# Patient Record
Sex: Male | Born: 1945
Health system: Southern US, Community
[De-identification: ages and names within clinical notes are randomized; demographics above are authoritative.]

## PROBLEM LIST (undated history)

## (undated) DIAGNOSIS — R001 Bradycardia, unspecified: Secondary | ICD-10-CM

## (undated) DIAGNOSIS — Z9889 Other specified postprocedural states: Secondary | ICD-10-CM

## (undated) DIAGNOSIS — M1990 Unspecified osteoarthritis, unspecified site: Secondary | ICD-10-CM

## (undated) DIAGNOSIS — H409 Unspecified glaucoma: Secondary | ICD-10-CM

## (undated) DIAGNOSIS — G5762 Lesion of plantar nerve, left lower limb: Secondary | ICD-10-CM

## (undated) DIAGNOSIS — I6529 Occlusion and stenosis of unspecified carotid artery: Secondary | ICD-10-CM

## (undated) DIAGNOSIS — I4510 Unspecified right bundle-branch block: Secondary | ICD-10-CM

## (undated) DIAGNOSIS — I493 Ventricular premature depolarization: Secondary | ICD-10-CM

## (undated) DIAGNOSIS — Z86010 Personal history of colonic polyps: Principal | ICD-10-CM

## (undated) DIAGNOSIS — K648 Other hemorrhoids: Secondary | ICD-10-CM

## (undated) DIAGNOSIS — I2510 Atherosclerotic heart disease of native coronary artery without angina pectoris: Secondary | ICD-10-CM

## (undated) DIAGNOSIS — K4090 Unilateral inguinal hernia, without obstruction or gangrene, not specified as recurrent: Secondary | ICD-10-CM

## (undated) HISTORY — PX: POLYPECTOMY: SHX149

## (undated) HISTORY — PX: TONSILLECTOMY AND ADENOIDECTOMY: SUR1326

## (undated) HISTORY — PX: TONSILLECTOMY: SUR1361

## (undated) HISTORY — DX: Bradycardia, unspecified: R00.1

## (undated) HISTORY — DX: Other hemorrhoids: K64.8

## (undated) HISTORY — PX: COLONOSCOPY: SHX174

## (undated) HISTORY — PX: ELBOW SURGERY: SHX618

## (undated) HISTORY — PX: INGUINAL HERNIA REPAIR: SUR1180

## (undated) HISTORY — DX: Unspecified glaucoma: H40.9

## (undated) HISTORY — DX: Personal history of colonic polyps: Z86.010

## (undated) HISTORY — PX: HERNIA REPAIR: SHX51

## (undated) HISTORY — DX: Ventricular premature depolarization: I49.3

---

## 1999-12-26 ENCOUNTER — Encounter: Payer: Self-pay | Admitting: Emergency Medicine

## 1999-12-26 ENCOUNTER — Emergency Department (HOSPITAL_COMMUNITY): Admission: EM | Admit: 1999-12-26 | Discharge: 1999-12-26 | Payer: Self-pay | Admitting: Emergency Medicine

## 1999-12-28 ENCOUNTER — Emergency Department (HOSPITAL_COMMUNITY): Admission: EM | Admit: 1999-12-28 | Discharge: 1999-12-28 | Payer: Self-pay | Admitting: Emergency Medicine

## 2000-01-04 ENCOUNTER — Emergency Department (HOSPITAL_COMMUNITY): Admission: EM | Admit: 2000-01-04 | Discharge: 2000-01-04 | Payer: Self-pay | Admitting: Emergency Medicine

## 2005-04-12 ENCOUNTER — Ambulatory Visit: Payer: Self-pay

## 2005-04-12 ENCOUNTER — Encounter: Payer: Self-pay | Admitting: Internal Medicine

## 2005-04-12 ENCOUNTER — Ambulatory Visit: Payer: Self-pay | Admitting: Internal Medicine

## 2005-04-15 ENCOUNTER — Ambulatory Visit: Payer: Self-pay

## 2005-04-15 ENCOUNTER — Ambulatory Visit: Payer: Self-pay | Admitting: Internal Medicine

## 2005-04-15 DIAGNOSIS — Z9289 Personal history of other medical treatment: Secondary | ICD-10-CM

## 2005-04-15 HISTORY — DX: Personal history of other medical treatment: Z92.89

## 2005-04-20 ENCOUNTER — Encounter: Admission: RE | Admit: 2005-04-20 | Discharge: 2005-04-20 | Payer: Self-pay | Admitting: Internal Medicine

## 2006-02-10 ENCOUNTER — Ambulatory Visit: Payer: Self-pay | Admitting: Internal Medicine

## 2006-02-21 ENCOUNTER — Ambulatory Visit: Payer: Self-pay | Admitting: Internal Medicine

## 2008-07-08 ENCOUNTER — Ambulatory Visit: Payer: Self-pay | Admitting: Surgery

## 2009-07-15 ENCOUNTER — Ambulatory Visit: Payer: Self-pay | Admitting: Vascular Surgery

## 2009-11-18 ENCOUNTER — Ambulatory Visit (HOSPITAL_COMMUNITY): Admission: RE | Admit: 2009-11-18 | Discharge: 2009-11-18 | Payer: Self-pay | Admitting: General Surgery

## 2010-07-11 LAB — SURGICAL PCR SCREEN
MRSA, PCR: NEGATIVE
Staphylococcus aureus: NEGATIVE

## 2010-09-08 NOTE — Procedures (Signed)
CAROTID DUPLEX EXAM   INDICATION:  Left retinal artery plaque.   HISTORY:  Diabetes:  No.  Cardiac:  No.  Hypertension:  No.  Smoking:  No.  Previous Surgery:  No.  CV History:  No.  Amaurosis Fugax No, Paresthesias No, Hemiparesis No.                                       RIGHT             LEFT  Brachial systolic pressure:         100               96  Brachial Doppler waveforms:         Within normal limits                Within normal limits  Vertebral direction of flow:        Antegrade         Antegrade  DUPLEX VELOCITIES (cm/sec)  CCA peak systolic                   184               118  ECA peak systolic                   87                75  ICA peak systolic                   109               90  ICA end diastolic                   30                28  PLAQUE MORPHOLOGY:                  Homogeneous       Homogeneous  PLAQUE AMOUNT:                      Mild              Mild  PLAQUE LOCATION:                    BIF/ICA           BIF/ICA   IMPRESSION:  20-39% bilateral internal carotid artery stenosis.   ___________________________________________  V. Charlena Cross, MD   AC/MEDQ  D:  07/08/2008  T:  07/08/2008  Job:  [id number]

## 2010-09-08 NOTE — Procedures (Signed)
CAROTID DUPLEX EXAM   INDICATION:  Follow up retinal artery plaque.   HISTORY:  Diabetes:  No.  Cardiac:  No.  Hypertension:  No.  Smoking:  No.  Previous Surgery:  No.  CV History:  No.  Amaurosis Fugax No, Paresthesias No, Hemiparesis No.                                       RIGHT             LEFT  Brachial systolic pressure:         104               112  Brachial Doppler waveforms:         WNL               WNL  Vertebral direction of flow:        Antegrade         Antegrade  DUPLEX VELOCITIES (cm/sec)  CCA peak systolic                   159               126  ECA peak systolic                   89                105  ICA peak systolic                   99                101  ICA end diastolic                   47                32  PLAQUE MORPHOLOGY:                                    Homogenous  PLAQUE AMOUNT:                                        Mild  PLAQUE LOCATION:                                      ECA   IMPRESSION:  1. Bilateral internal carotid arteries suggest 20-39% stenosis.  2. Antegrade flow in bilateral vertebrals.          ___________________________________________  Quita Skye Hart [place], M.D.   CB/MEDQ  D:  07/15/2009  T:  07/16/2009  Job:  [id number]

## 2015-04-07 ENCOUNTER — Encounter: Payer: Self-pay | Admitting: Internal Medicine

## 2015-06-30 DIAGNOSIS — M1711 Unilateral primary osteoarthritis, right knee: Secondary | ICD-10-CM | POA: Diagnosis not present

## 2015-07-14 ENCOUNTER — Other Ambulatory Visit: Payer: Self-pay | Admitting: Orthopaedic Surgery

## 2015-07-14 DIAGNOSIS — M25561 Pain in right knee: Secondary | ICD-10-CM

## 2015-07-16 DIAGNOSIS — H5201 Hypermetropia, right eye: Secondary | ICD-10-CM | POA: Diagnosis not present

## 2015-07-16 DIAGNOSIS — H52223 Regular astigmatism, bilateral: Secondary | ICD-10-CM | POA: Diagnosis not present

## 2015-07-16 DIAGNOSIS — H401112 Primary open-angle glaucoma, right eye, moderate stage: Secondary | ICD-10-CM | POA: Diagnosis not present

## 2015-07-24 ENCOUNTER — Ambulatory Visit
Admission: RE | Admit: 2015-07-24 | Discharge: 2015-07-24 | Disposition: A | Discharge status: Home / Self Care | Payer: PPO | Source: Ambulatory Visit | Attending: Orthopaedic Surgery | Admitting: Orthopaedic Surgery

## 2015-07-24 DIAGNOSIS — M25561 Pain in right knee: Secondary | ICD-10-CM

## 2015-07-24 DIAGNOSIS — S83241A Other tear of medial meniscus, current injury, right knee, initial encounter: Secondary | ICD-10-CM | POA: Diagnosis not present

## 2015-07-25 DIAGNOSIS — M25561 Pain in right knee: Secondary | ICD-10-CM | POA: Diagnosis not present

## 2015-08-06 DIAGNOSIS — Z125 Encounter for screening for malignant neoplasm of prostate: Secondary | ICD-10-CM | POA: Diagnosis not present

## 2015-08-06 DIAGNOSIS — Z0000 Encounter for general adult medical examination without abnormal findings: Secondary | ICD-10-CM | POA: Diagnosis not present

## 2015-08-14 DIAGNOSIS — Z6824 Body mass index (BMI) 24.0-24.9, adult: Secondary | ICD-10-CM | POA: Diagnosis not present

## 2015-08-14 DIAGNOSIS — G5762 Lesion of plantar nerve, left lower limb: Secondary | ICD-10-CM | POA: Diagnosis not present

## 2015-08-14 DIAGNOSIS — I6529 Occlusion and stenosis of unspecified carotid artery: Secondary | ICD-10-CM | POA: Diagnosis not present

## 2015-08-14 DIAGNOSIS — Z125 Encounter for screening for malignant neoplasm of prostate: Secondary | ICD-10-CM | POA: Diagnosis not present

## 2015-08-14 DIAGNOSIS — Z1389 Encounter for screening for other disorder: Secondary | ICD-10-CM | POA: Diagnosis not present

## 2015-08-14 DIAGNOSIS — Z0000 Encounter for general adult medical examination without abnormal findings: Secondary | ICD-10-CM | POA: Diagnosis not present

## 2015-08-14 DIAGNOSIS — E784 Other hyperlipidemia: Secondary | ICD-10-CM | POA: Diagnosis not present

## 2015-08-14 DIAGNOSIS — H4089 Other specified glaucoma: Secondary | ICD-10-CM | POA: Diagnosis not present

## 2015-08-14 DIAGNOSIS — M23309 Other meniscus derangements, unspecified meniscus, unspecified knee: Secondary | ICD-10-CM | POA: Diagnosis not present

## 2015-08-18 ENCOUNTER — Other Ambulatory Visit: Payer: Self-pay | Admitting: Internal Medicine

## 2015-08-18 DIAGNOSIS — E785 Hyperlipidemia, unspecified: Secondary | ICD-10-CM

## 2015-08-22 DIAGNOSIS — Z1212 Encounter for screening for malignant neoplasm of rectum: Secondary | ICD-10-CM | POA: Diagnosis not present

## 2015-08-25 ENCOUNTER — Other Ambulatory Visit: Payer: PPO

## 2015-08-26 ENCOUNTER — Other Ambulatory Visit: Payer: Self-pay | Admitting: Cardiovascular Disease

## 2015-08-26 DIAGNOSIS — E785 Hyperlipidemia, unspecified: Secondary | ICD-10-CM

## 2015-08-28 ENCOUNTER — Ambulatory Visit (INDEPENDENT_AMBULATORY_CARE_PROVIDER_SITE_OTHER)
Admission: RE | Admit: 2015-08-28 | Discharge: 2015-08-28 | Disposition: A | Discharge status: Home / Self Care | Payer: Self-pay | Source: Ambulatory Visit | Attending: Cardiovascular Disease | Admitting: Cardiovascular Disease

## 2015-08-28 DIAGNOSIS — E785 Hyperlipidemia, unspecified: Secondary | ICD-10-CM

## 2015-08-28 DIAGNOSIS — I2510 Atherosclerotic heart disease of native coronary artery without angina pectoris: Secondary | ICD-10-CM | POA: Diagnosis not present

## 2015-09-25 HISTORY — PX: KNEE ARTHROSCOPY W/ MENISCAL REPAIR: SHX1877

## 2015-09-25 HISTORY — PX: MENISCUS REPAIR: SHX5179

## 2015-10-02 DIAGNOSIS — M94261 Chondromalacia, right knee: Secondary | ICD-10-CM | POA: Diagnosis not present

## 2015-10-02 DIAGNOSIS — M23231 Derangement of other medial meniscus due to old tear or injury, right knee: Secondary | ICD-10-CM | POA: Diagnosis not present

## 2015-10-02 DIAGNOSIS — M6751 Plica syndrome, right knee: Secondary | ICD-10-CM | POA: Diagnosis not present

## 2015-10-02 DIAGNOSIS — M23321 Other meniscus derangements, posterior horn of medial meniscus, right knee: Secondary | ICD-10-CM | POA: Diagnosis not present

## 2015-10-02 DIAGNOSIS — M659 Synovitis and tenosynovitis, unspecified: Secondary | ICD-10-CM | POA: Diagnosis not present

## 2015-10-02 DIAGNOSIS — M794 Hypertrophy of (infrapatellar) fat pad: Secondary | ICD-10-CM | POA: Diagnosis not present

## 2015-10-02 DIAGNOSIS — G8918 Other acute postprocedural pain: Secondary | ICD-10-CM | POA: Diagnosis not present

## 2015-11-24 DIAGNOSIS — M23321 Other meniscus derangements, posterior horn of medial meniscus, right knee: Secondary | ICD-10-CM | POA: Diagnosis not present

## 2016-01-02 DIAGNOSIS — M1711 Unilateral primary osteoarthritis, right knee: Secondary | ICD-10-CM | POA: Diagnosis not present

## 2016-01-14 DIAGNOSIS — H401112 Primary open-angle glaucoma, right eye, moderate stage: Secondary | ICD-10-CM | POA: Diagnosis not present

## 2016-02-04 ENCOUNTER — Encounter: Payer: Self-pay | Admitting: Internal Medicine

## 2016-03-03 ENCOUNTER — Encounter: Payer: Self-pay | Admitting: Internal Medicine

## 2016-04-14 DIAGNOSIS — L84 Corns and callosities: Secondary | ICD-10-CM | POA: Diagnosis not present

## 2016-04-14 DIAGNOSIS — L819 Disorder of pigmentation, unspecified: Secondary | ICD-10-CM | POA: Diagnosis not present

## 2016-04-14 DIAGNOSIS — D225 Melanocytic nevi of trunk: Secondary | ICD-10-CM | POA: Diagnosis not present

## 2016-04-14 DIAGNOSIS — L821 Other seborrheic keratosis: Secondary | ICD-10-CM | POA: Diagnosis not present

## 2016-04-14 DIAGNOSIS — D1801 Hemangioma of skin and subcutaneous tissue: Secondary | ICD-10-CM | POA: Diagnosis not present

## 2016-04-14 DIAGNOSIS — L570 Actinic keratosis: Secondary | ICD-10-CM | POA: Diagnosis not present

## 2016-04-14 DIAGNOSIS — D2272 Melanocytic nevi of left lower limb, including hip: Secondary | ICD-10-CM | POA: Diagnosis not present

## 2016-04-26 HISTORY — PX: HEMORRHOID BANDING: SHX5850

## 2016-05-05 ENCOUNTER — Ambulatory Visit (AMBULATORY_SURGERY_CENTER): Payer: Self-pay | Admitting: *Deleted

## 2016-05-05 VITALS — Ht 74.0 in | Wt 189.0 lb

## 2016-05-05 DIAGNOSIS — Z1211 Encounter for screening for malignant neoplasm of colon: Secondary | ICD-10-CM

## 2016-05-05 NOTE — Progress Notes (Signed)
Patient denies any allergies to eggs or soy. Patient denies any problems with anesthesia/sedation. Patient denies any oxygen use at home and does not take any diet/weight loss medications. EMMI education patient declined.

## 2016-05-06 ENCOUNTER — Encounter: Payer: Self-pay | Admitting: Internal Medicine

## 2016-05-19 ENCOUNTER — Encounter: Payer: Self-pay | Admitting: Internal Medicine

## 2016-05-19 ENCOUNTER — Ambulatory Visit (AMBULATORY_SURGERY_CENTER): Payer: PPO | Admitting: Internal Medicine

## 2016-05-19 VITALS — BP 102/70 | HR 42 | Temp 98.2°F | Resp 24 | Ht 74.0 in | Wt 189.0 lb

## 2016-05-19 DIAGNOSIS — D123 Benign neoplasm of transverse colon: Secondary | ICD-10-CM

## 2016-05-19 DIAGNOSIS — D125 Benign neoplasm of sigmoid colon: Secondary | ICD-10-CM | POA: Diagnosis not present

## 2016-05-19 DIAGNOSIS — D122 Benign neoplasm of ascending colon: Secondary | ICD-10-CM

## 2016-05-19 DIAGNOSIS — K635 Polyp of colon: Secondary | ICD-10-CM | POA: Diagnosis not present

## 2016-05-19 DIAGNOSIS — Z1212 Encounter for screening for malignant neoplasm of rectum: Secondary | ICD-10-CM

## 2016-05-19 DIAGNOSIS — Z1211 Encounter for screening for malignant neoplasm of colon: Secondary | ICD-10-CM | POA: Diagnosis not present

## 2016-05-19 DIAGNOSIS — D129 Benign neoplasm of anus and anal canal: Secondary | ICD-10-CM

## 2016-05-19 DIAGNOSIS — D128 Benign neoplasm of rectum: Secondary | ICD-10-CM | POA: Diagnosis not present

## 2016-05-19 DIAGNOSIS — K649 Unspecified hemorrhoids: Secondary | ICD-10-CM | POA: Diagnosis not present

## 2016-05-19 MED ORDER — SODIUM CHLORIDE 0.9 % IV SOLN: 500.0000 mL | INTRAVENOUS | Status: DC

## 2016-05-19 NOTE — Op Note (Signed)
[place] Patient Name: Andrew Payne Procedure Date: 05/19/2016 11:11 AM MRN: [id number] Endoscopist: Gatha Mayer , MD Age: 71 Referring MD:  Date of Birth: 1945-09-21 Gender: Male Account #: [id number] Procedure:                Colonoscopy Indications:              Screening for colorectal malignant neoplasm, Last                            colonoscopy: 2007 Medicines:                Propofol per Anesthesia, Monitored Anesthesia Care Procedure:                Pre-Anesthesia Assessment:                           - Prior to the procedure, a History and Physical                            was performed, and patient medications and                            allergies were reviewed. The patient's tolerance of                            previous anesthesia was also reviewed. The risks                            and benefits of the procedure and the sedation                            options and risks were discussed with the patient.                            All questions were answered, and informed consent                            was obtained. Prior Anticoagulants: The patient                            last took aspirin 1 day prior to the procedure. ASA                            Grade Assessment: II - A patient with mild systemic                            disease. After reviewing the risks and benefits,                            the patient was deemed in satisfactory condition to                            undergo the procedure.  After obtaining informed consent, the colonoscope                            was passed under direct vision. Throughout the                            procedure, the patient's blood pressure, pulse, and                            oxygen saturations were monitored continuously. The                            Model CF-HQ190L [id number]) scope was introduced                            through the anus and advanced to  the the cecum,                            identified by appendiceal orifice and ileocecal                            valve. The colonoscopy was performed without                            difficulty. The patient tolerated the procedure                            well. The quality of the bowel preparation was                            excellent. The bowel preparation used was Miralax.                            The ileocecal valve, appendiceal orifice, and                            rectum were photographed. Scope In: 11:19:04 AM Scope Out: 11:44:02 AM Scope Withdrawal Time: 0 hours 21 minutes 29 seconds  Total Procedure Duration: 0 hours 24 minutes 58 seconds  Findings:                 Five sessile and semi-pedunculated polyps were                            found in the rectum, transverse colon and ascending                            colon. The polyps were 5 to 8 mm in size. These                            polyps were removed with a cold snare. Resection                            and retrieval were complete. Verification of  patient identification for the specimen was done.                            Estimated blood loss was minimal.                           A 12 mm polyp was found in the sigmoid colon. The                            polyp was sessile. The polyp was removed with a hot                            snare. Resection and retrieval were complete.                            Verification of patient identification for the                            specimen was done. Estimated blood loss: none.                           Two sessile polyps were found in the transverse                            colon and ascending colon. The polyps were                            diminutive in size. These polyps were removed with                            a cold biopsy forceps. Resection and retrieval were                            complete. Verification of patient  identification                            for the specimen was done. Estimated blood loss was                            minimal.                           Multiple small and large-mouthed diverticula were                            found in the sigmoid colon.                           Internal hemorrhoids were found during retroflexion                            and during perianal exam. The hemorrhoids were  Grade III (internal hemorrhoids that prolapse but                            require manual reduction).                           The perianal exam findings include internal                            hemorrhoids that prolapse with straining, but                            require manual replacement into the anal canal                            (Grade III).                           The digital rectal exam was normal. Pertinent                            negatives include normal prostate (size, shape, and                            consistency). Complications:            No immediate complications. Estimated Blood Loss:     Estimated blood loss was minimal. Impression:               - Five 5 to 8 mm polyps in the rectum, in the                            transverse colon and in the ascending colon,                            removed with a cold snare. Resected and retrieved.                           - One 12 mm polyp in the sigmoid colon, removed                            with a hot snare. Resected and retrieved.                           - Two diminutive polyps in the transverse colon and                            in the ascending colon, removed with a cold biopsy                            forceps. Resected and retrieved.                           - Diverticulosis in the sigmoid colon.                           -  Internal hemorrhoids.                           - Internal hemorrhoids that prolapse with                            straining, but require  manual replacement into the                            anal canal (Grade III) found on perianal exam. Recommendation:           - Patient has a contact number available for                            emergencies. The signs and symptoms of potential                            delayed complications were discussed with the                            patient. Return to normal activities tomorrow.                            Written discharge instructions were provided to the                            patient.                           - Resume previous diet.                           - Continue present medications.                           - No aspirin, ibuprofen, naproxen, or other                            non-steroidal anti-inflammatory drugs for 2 weeks                            after polyp removal.                           - Repeat colonoscopy is recommended for                            surveillance. The colonoscopy date will be                            determined after pathology results from today's                            exam become available for review. Gatha Mayer, MD 05/19/2016 11:59:25 AM This report has been signed electronically.

## 2016-05-19 NOTE — Progress Notes (Signed)
Called to room to assist during endoscopic procedure.  Patient ID and intended procedure confirmed with present staff. Received instructions for my participation in the procedure from the performing physician.  

## 2016-05-19 NOTE — Patient Instructions (Addendum)
I found and removed 8 polyps today - all look benign.  I will let you know pathology results and when to have another routine colonoscopy by mail. Probably in 3 years.  See you in [place] for the hemorrhoids - I think I can help you with those.  I appreciate the opportunity to care for you. Gatha Mayer, MD, FACG   YOU HAD AN ENDOSCOPIC PROCEDURE TODAY AT [facility]:   Refer to the procedure report that was given to you for any specific questions about what was found during the examination.  If the procedure report does not answer your questions, please call your gastroenterologist to clarify.  If you requested that your care partner not be given the details of your procedure findings, then the procedure report has been included in a sealed envelope for you to review at your convenience later.  YOU SHOULD EXPECT: Some feelings of bloating in the abdomen. Passage of more gas than usual.  Walking can help get rid of the air that was put into your GI tract during the procedure and reduce the bloating. If you had a lower endoscopy (such as a colonoscopy or flexible sigmoidoscopy) you may notice spotting of blood in your stool or on the toilet paper. If you underwent a bowel prep for your procedure, you may not have a normal bowel movement for a few days.  Please Note:  You might notice some irritation and congestion in your nose or some drainage.  This is from the oxygen used during your procedure.  There is no need for concern and it should clear up in a day or so.  SYMPTOMS TO REPORT IMMEDIATELY:   Following lower endoscopy (colonoscopy or flexible sigmoidoscopy):  Excessive amounts of blood in the stool  Significant tenderness or worsening of abdominal pains  Swelling of the abdomen that is new, acute  Fever of 100F or higher  For urgent or emergent issues, a gastroenterologist can be reached at any hour by calling [phone].   DIET:  We do recommend a  small meal at first, but then you may proceed to your regular diet.  Drink plenty of fluids but you should avoid alcoholic beverages for 24 hours.  ACTIVITY:  You should plan to take it easy for the rest of today and you should NOT DRIVE or use heavy machinery until tomorrow (because of the sedation medicines used during the test).    FOLLOW UP: Our staff will call the number listed on your records the next business day following your procedure to check on you and address any questions or concerns that you may have regarding the information given to you following your procedure. If we do not reach you, we will leave a message.  However, if you are feeling well and you are not experiencing any problems, there is no need to return our call.  We will assume that you have returned to your regular daily activities without incident.  If any biopsies were taken you will be contacted by phone or by letter within the next 1-3 weeks.  Please call us at [phone] if you have not heard about the biopsies in 3 weeks.   Await biopsy results, repeat Colonoscopy will be determined after pathology results. Polyps (handout given) Diverticulosis (handout given) Hemorrhoids (handout given) No aspirin, ibuprofen,naproxen, or other non-steriodal anti-inflammatory drugs for 2 weeks after polyp removal.   SIGNATURES/CONFIDENTIALITY: You and/or your care partner have signed paperwork which will be entered  into your electronic medical record.  These signatures attest to the fact that that the information above on your After Visit Summary has been reviewed and is understood.  Full responsibility of the confidentiality of this discharge information lies with you and/or your care-partner.

## 2016-05-20 ENCOUNTER — Telehealth: Payer: Self-pay | Admitting: *Deleted

## 2016-05-20 NOTE — Telephone Encounter (Signed)
No answer. Name identifier. Message left to call if any questions or concerns. 

## 2016-05-21 ENCOUNTER — Telehealth: Payer: Self-pay

## 2016-05-21 NOTE — Telephone Encounter (Signed)
  Follow up Call-  Call back number 05/19/2016  Post procedure Call Back phone  # [phone]  Permission to leave phone message Yes  Some recent data might be hidden    Patient was called for follow up after his procedure on 05/19/2016. No answer at the number given for follow up phone call. I could not leave a message due to the line was busy.

## 2016-05-27 ENCOUNTER — Encounter: Payer: Self-pay | Admitting: Internal Medicine

## 2016-05-27 DIAGNOSIS — Z86010 Personal history of colonic polyps: Secondary | ICD-10-CM

## 2016-05-27 DIAGNOSIS — Z860101 Personal history of adenomatous and serrated colon polyps: Secondary | ICD-10-CM

## 2016-05-27 HISTORY — DX: Personal history of colonic polyps: Z86.010

## 2016-05-27 HISTORY — PX: COLONOSCOPY: SHX174

## 2016-05-27 HISTORY — DX: Personal history of adenomatous and serrated colon polyps: Z86.0101

## 2016-05-27 NOTE — Progress Notes (Signed)
8 polyps max 12 mm all adenomas Recall 2021

## 2016-06-03 ENCOUNTER — Ambulatory Visit (INDEPENDENT_AMBULATORY_CARE_PROVIDER_SITE_OTHER): Payer: PPO | Admitting: Internal Medicine

## 2016-06-03 ENCOUNTER — Encounter: Payer: Self-pay | Admitting: Internal Medicine

## 2016-06-03 VITALS — BP 100/70 | HR 60 | Ht 74.0 in | Wt 188.0 lb

## 2016-06-03 DIAGNOSIS — K642 Third degree hemorrhoids: Secondary | ICD-10-CM | POA: Diagnosis not present

## 2016-06-03 NOTE — Assessment & Plan Note (Signed)
Left lateral grade 3 column banded. Return in a few weeks. Benefiber daily for now perhaps chronically. 1 tablespoon dose

## 2016-06-03 NOTE — Progress Notes (Signed)
   Here regarding hemorrhoids which were noticed and discussed at recent colonoscopy.  Sxs: Prolapse and soiling and cleansing issues  Rectal: bulging Gr 3 LL internal hemorrhoid, no masses  Anoscopy: Gr3 LL and Gr 2 RA/RP internal hemorrhoids   PROCEDURE NOTE: The patient presents with symptomatic grade 3  hemorrhoids, requesting rubber band ligation of his/her hemorrhoidal disease.  All risks, benefits and alternative forms of therapy were described and informed consent was obtained.   The anorectum was pre-medicated with 0.125% NTG and 5% lidocaine The decision was made to band the LL internal hemorrhoid, and the [place] was used to perform band ligation without complication.  Digital anorectal examination was then performed to assure proper positioning of the band, and to adjust the banded tissue as required.  The patient was discharged home without pain or other issues.  Dietary and behavioral recommendations were given and along with follow-up instructions.     The following adjunctive treatments were recommended:  Benefiber 1 tbsp/day  The patient will return in 2-3 weeks for  follow-up and possible additional banding as required. No complications were encountered and the patient tolerated the procedure well.  I appreciate the opportunity to care for this patient. [id number], Gwyndolyn Saxon, MD

## 2016-06-03 NOTE — Patient Instructions (Addendum)
  HEMORRHOID BANDING PROCEDURE    FOLLOW-UP CARE   1. The procedure you have had should have been relatively painless since the banding of the area involved does not have nerve endings and there is no pain sensation.  The rubber band cuts off the blood supply to the hemorrhoid and the band may fall off as soon as 48 hours after the banding (the band may occasionally be seen in the toilet bowl following a bowel movement). You may notice a temporary feeling of fullness in the rectum which should respond adequately to plain Tylenol or Motrin.  2. Following the banding, avoid strenuous exercise that evening and resume full activity the next day.  A sitz bath (soaking in a warm tub) or bidet is soothing, and can be useful for cleansing the area after bowel movements.     3. To avoid constipation, take two tablespoons of natural wheat bran, natural oat bran, flax, Benefiber or any over the counter fiber supplement and increase your water intake to 7-8 glasses daily.    4. Unless you have been prescribed anorectal medication, do not put anything inside your rectum for two weeks: No suppositories, enemas, fingers, etc.  5. Occasionally, you may have more bleeding than usual after the banding procedure.  This is often from the untreated hemorrhoids rather than the treated one.  Don't be concerned if there is a tablespoon or so of blood.  If there is more blood than this, lie flat with your bottom higher than your head and apply an ice pack to the area. If the bleeding does not stop within a half an hour or if you feel faint, call our office at [phone] or go to the emergency room.  6. Problems are not common; however, if there is a substantial amount of bleeding, severe pain, chills, fever or difficulty passing urine (very rare) or other problems, you should call us at [phone] or report to the nearest emergency room.  7. Do not stay seated continuously for more than 2-3 hours for a day or  two after the procedure.  Tighten your buttock muscles 10-15 times every two hours and take 10-15 deep breaths every 1-2 hours.  Do not spend more than a few minutes on the toilet if you cannot empty your bowel; instead re-visit the toilet at a later time.    Please take a tablespoon of benefiber daily.   We will see you for additional banding on 06/21/16 at 3:00pm    I appreciate the opportunity to care for you. Silvano Rusk, MD, [facility]

## 2016-06-21 ENCOUNTER — Encounter (INDEPENDENT_AMBULATORY_CARE_PROVIDER_SITE_OTHER): Payer: Self-pay

## 2016-06-21 ENCOUNTER — Ambulatory Visit (INDEPENDENT_AMBULATORY_CARE_PROVIDER_SITE_OTHER): Payer: PPO | Admitting: Internal Medicine

## 2016-06-21 ENCOUNTER — Encounter: Payer: Self-pay | Admitting: Internal Medicine

## 2016-06-21 DIAGNOSIS — K642 Third degree hemorrhoids: Secondary | ICD-10-CM

## 2016-06-21 NOTE — Patient Instructions (Signed)
HEMORRHOID BANDING PROCEDURE    FOLLOW-UP CARE   1. The procedure you have had should have been relatively painless since the banding of the area involved does not have nerve endings and there is no pain sensation.  The rubber band cuts off the blood supply to the hemorrhoid and the band may fall off as soon as 48 hours after the banding (the band may occasionally be seen in the toilet bowl following a bowel movement). You may notice a temporary feeling of fullness in the rectum which should respond adequately to plain Tylenol or Motrin.  2. Following the banding, avoid strenuous exercise that evening and resume full activity the next day.  A sitz bath (soaking in a warm tub) or bidet is soothing, and can be useful for cleansing the area after bowel movements.     3. To avoid constipation, take two tablespoons of natural wheat bran, natural oat bran, flax, Benefiber or any over the counter fiber supplement and increase your water intake to 7-8 glasses daily.    4. Unless you have been prescribed anorectal medication, do not put anything inside your rectum for two weeks: No suppositories, enemas, fingers, etc.  5. Occasionally, you may have more bleeding than usual after the banding procedure.  This is often from the untreated hemorrhoids rather than the treated one.  Don't be concerned if there is a tablespoon or so of blood.  If there is more blood than this, lie flat with your bottom higher than your head and apply an ice pack to the area. If the bleeding does not stop within a half an hour or if you feel faint, call our office at [phone] or go to the emergency room.  6. Problems are not common; however, if there is a substantial amount of bleeding, severe pain, chills, fever or difficulty passing urine (very rare) or other problems, you should call us at [phone] or report to the nearest emergency room.  7. Do not stay seated continuously for more than 2-3 hours for a day or two  after the procedure.  Tighten your buttock muscles 10-15 times every two hours and take 10-15 deep breaths every 1-2 hours.  Do not spend more than a few minutes on the toilet if you cannot empty your bowel; instead re-visit the toilet at a later time.    Follow up with Dr Gessner as needed.    I appreciate the opportunity to care for you. Carl Gessner, MD, FACG 

## 2016-06-21 NOTE — Progress Notes (Signed)
    PROCEDURE NOTE: The patient presents with symptomatic grade 2-3  hemorrhoids, requesting rubber band ligation of his/her hemorrhoidal disease.  All risks, benefits and alternative forms of therapy were described and informed consent was obtained.   The anorectum was pre-medicated with 0.125% NTG and 5% lidocaine The decision was made to band the RP and RA internal hemorrhoid, and the [place] was used to perform band ligation without complication.  Digital anorectal examination was then performed to assure proper positioning of the band, and to adjust the banded tissue as required.  The patient was discharged home without pain or other issues.  Dietary and behavioral recommendations were given and along with follow-up instructions.     The patient will return  prn for  follow-up and possible additional banding as required. No complications were encountered and the patient tolerated the procedure well.  I appreciate the opportunity to care for you.  Cc: Marton Redwood, MD

## 2016-06-21 NOTE — Assessment & Plan Note (Signed)
RP and RA banded RTC prn 

## 2016-07-08 ENCOUNTER — Encounter: Payer: Self-pay | Admitting: Internal Medicine

## 2016-07-14 DIAGNOSIS — H5340 Unspecified visual field defects: Secondary | ICD-10-CM | POA: Diagnosis not present

## 2016-07-14 DIAGNOSIS — H5212 Myopia, left eye: Secondary | ICD-10-CM | POA: Diagnosis not present

## 2016-07-14 DIAGNOSIS — H52223 Regular astigmatism, bilateral: Secondary | ICD-10-CM | POA: Diagnosis not present

## 2016-07-14 DIAGNOSIS — H401112 Primary open-angle glaucoma, right eye, moderate stage: Secondary | ICD-10-CM | POA: Diagnosis not present

## 2016-08-04 DIAGNOSIS — M79672 Pain in left foot: Secondary | ICD-10-CM | POA: Diagnosis not present

## 2016-08-04 DIAGNOSIS — L851 Acquired keratosis [keratoderma] palmaris et plantaris: Secondary | ICD-10-CM | POA: Diagnosis not present

## 2016-08-04 DIAGNOSIS — G8929 Other chronic pain: Secondary | ICD-10-CM | POA: Diagnosis not present

## 2016-08-11 DIAGNOSIS — E784 Other hyperlipidemia: Secondary | ICD-10-CM | POA: Diagnosis not present

## 2016-08-11 DIAGNOSIS — Z0000 Encounter for general adult medical examination without abnormal findings: Secondary | ICD-10-CM | POA: Diagnosis not present

## 2016-08-11 DIAGNOSIS — Z125 Encounter for screening for malignant neoplasm of prostate: Secondary | ICD-10-CM | POA: Diagnosis not present

## 2016-08-14 DIAGNOSIS — M79672 Pain in left foot: Secondary | ICD-10-CM | POA: Diagnosis not present

## 2016-08-14 DIAGNOSIS — G8929 Other chronic pain: Secondary | ICD-10-CM | POA: Diagnosis not present

## 2016-08-16 DIAGNOSIS — Z1212 Encounter for screening for malignant neoplasm of rectum: Secondary | ICD-10-CM | POA: Diagnosis not present

## 2016-08-18 DIAGNOSIS — K649 Unspecified hemorrhoids: Secondary | ICD-10-CM | POA: Diagnosis not present

## 2016-08-18 DIAGNOSIS — Z6824 Body mass index (BMI) 24.0-24.9, adult: Secondary | ICD-10-CM | POA: Diagnosis not present

## 2016-08-18 DIAGNOSIS — Z1389 Encounter for screening for other disorder: Secondary | ICD-10-CM | POA: Diagnosis not present

## 2016-08-18 DIAGNOSIS — E784 Other hyperlipidemia: Secondary | ICD-10-CM | POA: Diagnosis not present

## 2016-08-18 DIAGNOSIS — Z86010 Personal history of colonic polyps: Secondary | ICD-10-CM | POA: Diagnosis not present

## 2016-08-18 DIAGNOSIS — Z0000 Encounter for general adult medical examination without abnormal findings: Secondary | ICD-10-CM | POA: Diagnosis not present

## 2016-08-18 DIAGNOSIS — M179 Osteoarthritis of knee, unspecified: Secondary | ICD-10-CM | POA: Diagnosis not present

## 2016-08-18 DIAGNOSIS — H409 Unspecified glaucoma: Secondary | ICD-10-CM | POA: Diagnosis not present

## 2016-08-18 DIAGNOSIS — I6529 Occlusion and stenosis of unspecified carotid artery: Secondary | ICD-10-CM | POA: Diagnosis not present

## 2016-08-18 DIAGNOSIS — G5762 Lesion of plantar nerve, left lower limb: Secondary | ICD-10-CM | POA: Diagnosis not present

## 2016-08-23 DIAGNOSIS — M7742 Metatarsalgia, left foot: Secondary | ICD-10-CM | POA: Diagnosis not present

## 2016-08-23 DIAGNOSIS — M79672 Pain in left foot: Secondary | ICD-10-CM | POA: Diagnosis not present

## 2016-08-23 DIAGNOSIS — G8929 Other chronic pain: Secondary | ICD-10-CM | POA: Diagnosis not present

## 2016-08-24 ENCOUNTER — Encounter: Payer: Self-pay | Admitting: Internal Medicine

## 2016-08-24 ENCOUNTER — Ambulatory Visit (INDEPENDENT_AMBULATORY_CARE_PROVIDER_SITE_OTHER): Payer: PPO | Admitting: Internal Medicine

## 2016-08-24 DIAGNOSIS — K642 Third degree hemorrhoids: Secondary | ICD-10-CM | POA: Diagnosis not present

## 2016-08-24 NOTE — Patient Instructions (Signed)
HEMORRHOID BANDING PROCEDURE    FOLLOW-UP CARE   1. The procedure you have had should have been relatively painless since the banding of the area involved does not have nerve endings and there is no pain sensation.  The rubber band cuts off the blood supply to the hemorrhoid and the band may fall off as soon as 48 hours after the banding (the band may occasionally be seen in the toilet bowl following a bowel movement). You may notice a temporary feeling of fullness in the rectum which should respond adequately to plain Tylenol or Motrin.  2. Following the banding, avoid strenuous exercise that evening and resume full activity the next day.  A sitz bath (soaking in a warm tub) or bidet is soothing, and can be useful for cleansing the area after bowel movements.     3. To avoid constipation, take two tablespoons of natural wheat bran, natural oat bran, flax, Benefiber or any over the counter fiber supplement and increase your water intake to 7-8 glasses daily.    4. Unless you have been prescribed anorectal medication, do not put anything inside your rectum for two weeks: No suppositories, enemas, fingers, etc.  5. Occasionally, you may have more bleeding than usual after the banding procedure.  This is often from the untreated hemorrhoids rather than the treated one.  Don't be concerned if there is a tablespoon or so of blood.  If there is more blood than this, lie flat with your bottom higher than your head and apply an ice pack to the area. If the bleeding does not stop within a half an hour or if you feel faint, call our office at [phone] or go to the emergency room.  6. Problems are not common; however, if there is a substantial amount of bleeding, severe pain, chills, fever or difficulty passing urine (very rare) or other problems, you should call us at [phone] or report to the nearest emergency room.  7. Do not stay seated continuously for more than 2-3 hours for a day or two  after the procedure.  Tighten your buttock muscles 10-15 times every two hours and take 10-15 deep breaths every 1-2 hours.  Do not spend more than a few minutes on the toilet if you cannot empty your bowel; instead re-visit the toilet at a later time.    Call us in a month with an update.   I appreciate the opportunity to care for you. Silvano Rusk, MD, [facility]

## 2016-08-24 NOTE — Assessment & Plan Note (Signed)
LL rebanded RTC prn Call back w/ update 1 month

## 2016-08-25 NOTE — Progress Notes (Signed)
   Sxs: recurrent prolapse and cleansing issues after prior banding  Rectal - Gr 3 prolapsed LL internal hemorrhoid with small anal tags  Anoscopy: Gr 3 internal hemorrhoid LL and Gr 1 RA/RP  PROCEDURE NOTE: The patient presents with symptomatic grade 3  hemorrhoids, requesting rubber band ligation of his/her hemorrhoidal disease.  All risks, benefits and alternative forms of therapy were described and informed consent was obtained.   The anorectum was pre-medicated with 0.125% NTG and 5% lidocaine The decision was made to band the LL internal hemorrhoid, and the Oden was used to perform band ligation without complication.  Digital anorectal examination was then performed to assure proper positioning of the band, and to adjust the banded tissue as required.  The patient was discharged home without pain or other issues.  Dietary and behavioral recommendations were given and along with follow-up instructions.     The following adjunctive treatments were recommended:   Benefiber  The patient will call in 1 month with a sx update and  follow-up and possible additional banding will be scheduled if needed. No complications were encountered and the patient tolerated the procedure well.  I appreciate the opportunity to care for this patient. SE:LTRV, Gwyndolyn Saxon, MD

## 2016-08-27 ENCOUNTER — Encounter: Payer: Self-pay | Admitting: Internal Medicine

## 2016-08-31 ENCOUNTER — Telehealth: Payer: Self-pay

## 2016-08-31 NOTE — Telephone Encounter (Signed)
-----   Message from Gatha Mayer, MD sent at 08/31/2016 10:49 AM EDT ----- Regarding: work in please Need to see him Fri re hemorrhoids please work in and let him know - he is checking My Chart if you want to use that  There will not be a charge as within global service period for the hemorrhoid banding - nut should be on the schedule  Front should know not to collect a copay

## 2016-08-31 NOTE — Telephone Encounter (Signed)
My chart message sent to the patient. appt scheduled for 3:30 on 09/03/16.

## 2016-09-03 ENCOUNTER — Encounter: Payer: Self-pay | Admitting: Internal Medicine

## 2016-09-03 ENCOUNTER — Ambulatory Visit (INDEPENDENT_AMBULATORY_CARE_PROVIDER_SITE_OTHER): Payer: Self-pay | Admitting: Internal Medicine

## 2016-09-03 VITALS — BP 100/64 | HR 52 | Ht 72.56 in | Wt 184.0 lb

## 2016-09-03 DIAGNOSIS — K6289 Other specified diseases of anus and rectum: Secondary | ICD-10-CM

## 2016-09-03 NOTE — Patient Instructions (Signed)
   Sorry you had pain after the banding. Seems better and that should continue. Call us back if it changes for the worse.  Please contact me again in about 3 weeks as we planned.  Recticare (5% liodcaine) can be used to relieve pain if needed.  I appreciate the opportunity to care for you. Gatha Mayer, MD, Marval Regal

## 2016-09-03 NOTE — Progress Notes (Signed)
   Patient is here for follow-up. He had hemorrhoidal banding on May 1. Next day he experienced fairly significant rectal pain. We had some communication off and on last week. He was improving but it was still very painful after defecation. I had done a repeat banding of the left lateral internal hemorrhoid. The pain is better overall. He feels like his tolerating it but is still there. There's been some scant the rectal bleeding.   On examination anoderm looks normal. There is no prolapsed hemorrhoids visible. In the left lateral canal he is tender and it can feel ulceration there. Nothing out of the ordinary otherwise.  I think he has rectal ulceration and pain related to the banding. He seems to be improving and this should heal and everything should be fine and hopefully the hemorrhoidal symptoms quickly the prolapse will be resolved. I will see him back as needed. He can use topical lidocaine when necessary.   no charge for this visit as part of follow-up after hemorrhoidal banding w/in the global time period.  I appreciate the opportunity to care for this patient. CC: Marton Redwood, MD

## 2016-10-04 DIAGNOSIS — M79672 Pain in left foot: Secondary | ICD-10-CM | POA: Diagnosis not present

## 2016-10-04 DIAGNOSIS — G8929 Other chronic pain: Secondary | ICD-10-CM | POA: Diagnosis not present

## 2016-10-04 DIAGNOSIS — M7742 Metatarsalgia, left foot: Secondary | ICD-10-CM | POA: Diagnosis not present

## 2016-10-06 ENCOUNTER — Encounter: Payer: Self-pay | Admitting: Internal Medicine

## 2016-12-02 ENCOUNTER — Ambulatory Visit (INDEPENDENT_AMBULATORY_CARE_PROVIDER_SITE_OTHER): Payer: PPO | Admitting: Orthopaedic Surgery

## 2016-12-02 ENCOUNTER — Encounter (INDEPENDENT_AMBULATORY_CARE_PROVIDER_SITE_OTHER): Payer: Self-pay | Admitting: Orthopaedic Surgery

## 2016-12-02 VITALS — BP 127/72 | HR 55 | Resp 15 | Ht 74.0 in | Wt 185.0 lb

## 2016-12-02 DIAGNOSIS — M7711 Lateral epicondylitis, right elbow: Secondary | ICD-10-CM | POA: Diagnosis not present

## 2016-12-02 DIAGNOSIS — M25521 Pain in right elbow: Secondary | ICD-10-CM

## 2016-12-02 MED ORDER — METHYLPREDNISOLONE ACETATE 40 MG/ML IJ SUSP
20.0000 mg | INTRAMUSCULAR | Status: AC | PRN
  Administered 2016-12-02: 20 mg

## 2016-12-02 MED ORDER — LIDOCAINE HCL 1 % IJ SOLN
0.5000 mL | INTRAMUSCULAR | Status: AC | PRN
  Administered 2016-12-02: .5 mL

## 2016-12-02 NOTE — Progress Notes (Signed)
Office Visit Note   Patient: Andrew Payne           Date of Birth: 16-Jun-1945           MRN: [id number] Visit Date: 12/02/2016              Requested by: Marton Redwood, MD [address] [place], [place] [address] PCP: Marton Redwood, MD   Assessment & Plan: Visit Diagnoses:  1. Right tennis elbow   2. Pain in right elbow     Plan: Long discussion regarding diagnosis and treatment options. I will plan to inject the elbow with cortisone today and apply a tennis elbow splint and provide him with exercises and plan to see him back in apparent basis. Postinjection his pain had resolved.  Follow-Up Instructions: Return if symptoms worsen or fail to improve.   Orders:  No orders of the defined types were placed in this encounter.  No orders of the defined types were placed in this encounter.     Procedures: Hand/UE Inj Date/Time: 12/02/2016 4:05 PM Performed by: Garald Balding Authorized by: Garald Balding   Consent Given by:  Patient Indications:  Pain Condition: lateral epicondylitis   Site:  R elbow Needle Size:  27 G Approach:  Lateral Medications:  0.5 mL lidocaine 1 %; 20 mg methylPREDNISolone acetate 40 MG/ML     Clinical Data: No additional findings.   Subjective: Chief Complaint  Patient presents with  . Right Elbow - Pain  . Elbow Pain    Right elbow pain x 1 month, tenderness, no injury, not diabetic, no elbow surgery,Tylenol helps  Maurice noted insidious onset of right lateral elbow pain approximately month ago without injury or trauma. 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 His pain is localized along the lateral aspect of the elbow and really at this point interfering with a lot of his activities. He's having trouble with grip or denies any numbness or tingling. Not having any shoulder pain  HPI  Review of Systems  Constitutional: Negative for fever.  Eyes: Negative for visual disturbance.  Respiratory: Negative for  shortness of breath.   Cardiovascular: Negative for leg swelling.  Gastrointestinal: Negative for constipation.  Endocrine: Negative for polyuria.  Genitourinary: Negative for frequency.  Musculoskeletal: Negative for arthralgias, back pain, gait problem, joint swelling and neck pain.  Neurological: Negative for weakness and numbness.  Hematological: Negative for adenopathy.  Psychiatric/Behavioral: Negative for sleep disturbance. The patient is not nervous/anxious.   All other systems reviewed and are negative.    Objective: Vital Signs: BP 127/72 (BP Location: Left Arm, Patient Position: Sitting, Cuff Size: Normal)   Pulse (!) 55   Resp 15   Ht 6\' 2"  (1.88 m)   Wt 185 lb (83.9 kg)   BMI 23.75 kg/m   Physical Exam  Ortho Exam right elbow with localized tenderness over the lateral epicondyle. Pain with grip in flexion and particularly with extension. Full extension and flexion. Skin intact neurovascular exam intact. No pain with range of motion or shoulder or cervical spine. No distal edema  Specialty Comments:  No specialty comments available.  Imaging: No results found.   PMFS History: Patient Active Problem List   Diagnosis Date Noted  . Prolapsed internal hemorrhoids, grade 3 06/03/2016  . Hx of adenomatous colonic polyps 05/27/2016   Past Medical History:  Diagnosis Date  . Bradycardia   . Glaucoma   . Hx of adenomatous colonic polyps 05/27/2016  . Internal  hemorrhoids   . PVC's (premature ventricular contractions)     Family History  Problem Relation Age of Onset  . Heart disease Mother   . Alzheimer's disease Father   . Colon cancer Neg Hx   . Pancreatic cancer Neg Hx   . Prostate cancer Neg Hx   . Rectal cancer Neg Hx     Past Surgical History:  Procedure Laterality Date  . COLONOSCOPY    . HEMORRHOID BANDING  2018  . HERNIA REPAIR    . MENISCUS REPAIR  09/2015  . TONSILLECTOMY     Social History   Occupational History  . Not on file.    Social History Main Topics  . Smoking status: Never Smoker  . Smokeless tobacco: Never Used  . Alcohol use No  . Drug use: No  . Sexual activity: Not on file

## 2016-12-02 NOTE — Patient Instructions (Signed)
Tennis Elbow Tennis elbow is puffiness (inflammation) of the outer tendons of your forearm close to your elbow. Your tendons attach your muscles to your bones. Tennis elbow can happen in any sport or job in which you use your elbow too much. It is caused by doing the same motion over and over. Tennis elbow can cause:  Pain and tenderness in your forearm and the outer part of your elbow.  A burning feeling. This runs from your elbow through your arm.  Weak grip in your hands.  Follow these instructions at home: Activity  Rest your elbow and wrist as told by your doctor. Try to avoid any activities that caused the problem until your doctor says that you can do them again.  If a physical therapist teaches you exercises, do all of them as told.  If you lift an object, lift it with your palm facing up. This is easier on your elbow. Lifestyle  If your tennis elbow is caused by sports, check your equipment and make sure that: ? You are using it correctly. ? It fits you well.  If your tennis elbow is caused by work, take breaks often, if you are able. Talk with your manager about doing your work in a way that is safe for you. ? If your tennis elbow is caused by computer use, talk with your manager about any changes that can be made to your work setup. General instructions  If told, apply ice to the painful area: ? Put ice in a plastic bag. ? Place a towel between your skin and the bag. ? Leave the ice on for 20 minutes, 2-3 times per day.  Take medicines only as told by your doctor.  If you were given a brace, wear it as told by your doctor.  Keep all follow-up visits as told by your doctor. This is important. Contact a doctor if:  Your pain does not get better with treatment.  Your pain gets worse.  You have weakness in your forearm, hand, or fingers.  You cannot feel your forearm, hand, or fingers. This information is not intended to replace advice given to you by your health  care provider. Make sure you discuss any questions you have with your health care provider. Document Released: 09/30/2009 Document Revised: 12/11/2015 Document Reviewed: 04/08/2014 Elsevier Interactive Patient Education  2018 Elsevier Inc.  

## 2016-12-17 DIAGNOSIS — H40011 Open angle with borderline findings, low risk, right eye: Secondary | ICD-10-CM | POA: Diagnosis not present

## 2017-02-05 DIAGNOSIS — Z23 Encounter for immunization: Secondary | ICD-10-CM | POA: Diagnosis not present

## 2017-02-28 ENCOUNTER — Encounter (INDEPENDENT_AMBULATORY_CARE_PROVIDER_SITE_OTHER): Payer: Self-pay | Admitting: Orthopaedic Surgery

## 2017-02-28 ENCOUNTER — Ambulatory Visit (INDEPENDENT_AMBULATORY_CARE_PROVIDER_SITE_OTHER): Payer: PPO | Admitting: Orthopaedic Surgery

## 2017-02-28 DIAGNOSIS — M25521 Pain in right elbow: Secondary | ICD-10-CM | POA: Diagnosis not present

## 2017-02-28 NOTE — Progress Notes (Signed)
Office Visit Note   Patient: Andrew Payne           Date of Birth: August 13, 1945           MRN: [id number] Visit Date: 02/28/2017              Requested by: Marton Redwood, MD [address] [place], [place] [address] PCP: Marton Redwood, MD   Assessment & Plan: Visit Diagnoses:  1. Right elbow pain     Plan: right tennis elbow. Minimal response to cortisone injection lasting only approximately 2 weeks.We will order MRI scan and consider surgical release depending upon results  Follow-Up Instructions: Return after MRI right elbow.   Orders:  Orders Placed This Encounter  Procedures  . MR ELBOW RIGHT WO CONTRAST   No orders of the defined types were placed in this encounter.     Procedures: No procedures performed   Clinical Data: No additional findings.   Subjective: Chief Complaint  Patient presents with  . Right Elbow - Pain    Andrew Payne is a 71 y o that is here for chronic Right elbow pain.   tennis elbow injected approximately 3 months ago. Had relief for approximately 2 weeks. Now having recurrent symptoms to the point of compromise. Pain is still localized along the lateral aspect of the right elbow No numbness or tingling  HPI  Review of Systems  Constitutional: Negative for fatigue.  HENT: Negative for hearing loss.   Respiratory: Negative for apnea, chest tightness and shortness of breath.   Cardiovascular: Negative for chest pain, palpitations and leg swelling.  Gastrointestinal: Negative for blood in stool, constipation and diarrhea.  Genitourinary: Negative for difficulty urinating.  Musculoskeletal: Negative for arthralgias, back pain, joint swelling, myalgias, neck pain and neck stiffness.  Neurological: Positive for weakness. Negative for numbness and headaches.  Hematological: Does not bruise/bleed easily.  Psychiatric/Behavioral: Negative for sleep disturbance. The patient is not nervous/anxious.      Objective: Vital Signs: BP 121/65    Pulse (!) 53   Resp 14   Ht 5\' 9"  (1.753 m)   Wt 175 lb (79.4 kg)   BMI 25.84 kg/m   Physical Exam  Ortho [place] alert and oriented 3. Comfortable sitting. Local tenderness directly over the lateral lip condyle right elbow. No skin changes. Good grip and release. Pain over the lateral lip condyle with grip more in extension than flexion. Full range of motion. No shoulder or neck pain  Specialty Comments:  No specialty comments available.  Imaging: No results found.   PMFS History: Patient Active Problem List   Diagnosis Date Noted  . Prolapsed internal hemorrhoids, grade 3 06/03/2016  . Hx of adenomatous colonic polyps 05/27/2016   Past Medical History:  Diagnosis Date  . Bradycardia   . Glaucoma   . Hx of adenomatous colonic polyps 05/27/2016  . Internal hemorrhoids   . PVC's (premature ventricular contractions)     Family History  Problem Relation Age of Onset  . Heart disease Mother   . Alzheimer's disease Father   . Colon cancer Neg Hx   . Pancreatic cancer Neg Hx   . Prostate cancer Neg Hx   . Rectal cancer Neg Hx     Past Surgical History:  Procedure Laterality Date  . COLONOSCOPY    . HEMORRHOID BANDING  2018  . HERNIA REPAIR    . MENISCUS REPAIR  09/2015  . TONSILLECTOMY     Social History   Occupational History  .  Not on file  Tobacco Use  . Smoking status: Never Smoker  . Smokeless tobacco: Never Used  Substance and Sexual Activity  . Alcohol use: No  . Drug use: No  . Sexual activity: Not on file

## 2017-03-04 ENCOUNTER — Encounter (INDEPENDENT_AMBULATORY_CARE_PROVIDER_SITE_OTHER): Payer: Self-pay | Admitting: Orthopaedic Surgery

## 2017-03-14 ENCOUNTER — Ambulatory Visit (INDEPENDENT_AMBULATORY_CARE_PROVIDER_SITE_OTHER): Payer: PPO | Admitting: Orthopaedic Surgery

## 2017-03-14 ENCOUNTER — Ambulatory Visit
Admission: RE | Admit: 2017-03-14 | Discharge: 2017-03-14 | Disposition: A | Discharge status: Home / Self Care | Payer: PPO | Source: Ambulatory Visit | Attending: Orthopaedic Surgery | Admitting: Orthopaedic Surgery

## 2017-03-14 DIAGNOSIS — M25521 Pain in right elbow: Secondary | ICD-10-CM

## 2017-03-14 DIAGNOSIS — S56511A Strain of other extensor muscle, fascia and tendon at forearm level, right arm, initial encounter: Secondary | ICD-10-CM | POA: Diagnosis not present

## 2017-03-15 ENCOUNTER — Other Ambulatory Visit (INDEPENDENT_AMBULATORY_CARE_PROVIDER_SITE_OTHER): Payer: Self-pay | Admitting: *Deleted

## 2017-03-15 DIAGNOSIS — M25521 Pain in right elbow: Secondary | ICD-10-CM

## 2017-03-15 NOTE — Progress Notes (Signed)
Please set up PT for right tennis elbow-try phoresis

## 2017-03-17 ENCOUNTER — Encounter (INDEPENDENT_AMBULATORY_CARE_PROVIDER_SITE_OTHER): Payer: Self-pay | Admitting: Orthopaedic Surgery

## 2017-03-21 ENCOUNTER — Ambulatory Visit (INDEPENDENT_AMBULATORY_CARE_PROVIDER_SITE_OTHER): Payer: PPO | Admitting: Orthopaedic Surgery

## 2017-03-22 ENCOUNTER — Ambulatory Visit: Payer: PPO | Attending: Orthopaedic Surgery | Admitting: Physical Therapy

## 2017-03-22 ENCOUNTER — Encounter: Payer: Self-pay | Admitting: Physical Therapy

## 2017-03-22 DIAGNOSIS — M25521 Pain in right elbow: Secondary | ICD-10-CM | POA: Insufficient documentation

## 2017-03-22 NOTE — Therapy (Signed)
[place], [place], [address] Phone: [phone]   Fax:  [phone]  Physical Therapy Evaluation  Patient Details  Name: Andrew Payne MRN: [id number] Date of Birth: November 20, 1945 Referring Provider: Garald Balding, MD   Encounter Date: 03/22/2017  PT End of Session - 03/22/17 0936    Visit Number  1    Number of Visits  9    Date for PT Re-Evaluation  04/22/17    Authorization Type  M[employer] ADVAN- kx at visit 15    PT Start Time  0931    PT Stop Time  1005    PT Time Calculation (min)  34 min    Activity Tolerance  Patient tolerated treatment well    Behavior During Therapy  [facility] for tasks assessed/performed       Past Medical History:  Diagnosis Date  . Bradycardia   . Glaucoma   . Hx of adenomatous colonic polyps 05/27/2016  . Internal hemorrhoids   . PVC's (premature ventricular contractions)     Past Surgical History:  Procedure Laterality Date  . COLONOSCOPY    . HEMORRHOID BANDING  2018  . HERNIA REPAIR    . MENISCUS REPAIR  09/2015  . TONSILLECTOMY      There were no vitals filed for this visit.   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 Enjoys playing golf and going to gym. Lifting glass with water or can of beans is very painful, has to switch to L. Pain with drying with towel and brushing teeth, ache at rest. Shaking hands is painful. Pain in right wrist. Treid wearing compression around forearm which was not helpfu. Injection in Aug of this year helped for about 2 weeks.     Currently in Pain?  No/denies    Aggravating Factors   lifting, gripping, pulling    Pain Relieving Factors  rest         [facility] PT Assessment - 03/22/17 0001      Assessment   Medical Diagnosis  R elbow pain    Referring Provider  Garald Balding, MD    Onset Date/Surgical Date  -- summer 2018    Hand  Dominance  Right    Prior Therapy  not this year      Precautions   Precautions  None      Restrictions   Weight Bearing Restrictions  No      Balance Screen   Has the patient fallen in the past 6 months  No      [place] residence      Prior Function   Level of Independence  Independent    Vocation  Full time employment;Volunteer work      Cognition   Overall Cognitive Status  Within Functional Limits for tasks assessed      Observation/Other Assessments   Focus on Therapeutic Outcomes (FOTO)   -- n/a      Sensation   Additional Comments  WFL      ROM / Strength   AROM / PROM / Strength  Strength      Strength   Strength Assessment Site  Hand    Right/Left hand  Right;Left    Right Hand Grip (lbs)  78 87 65    Left Hand Grip (lbs)  95 95 90  Palpation   Palpation comment  TTP wrist extensors, lateral elbow TTP             Objective measurements completed on examination: See above findings.      [facility] PT Treatment/Exercise - 03/22/17 0001      Modalities   Modalities  Iontophoresis      Iontophoresis   Type of Iontophoresis  Dexamethasone    Location  Rt lateral elbow    Dose  1cc    Time  6hr wear time             PT Education - 03/22/17 1021    Education provided  Yes    Education Details  anatomy of condition, POC, HEP, ice massage, DN, [place], ionto    Person(s) Educated  Patient    Methods  Explanation    Comprehension  Verbalized understanding          PT Long Term Goals - 03/22/17 1010      PT LONG TERM GOAL #1   Title  Pt will be able to lift large cup of water using Rt hand without limitaiton by pain    Baseline  uses L at eval    Time  4    Period  Weeks    Status  New    Target Date  04/22/17      PT LONG TERM GOAL #2   Title  Pt will be able to participate in volunteer activities and use Rt hand as needed    Baseline  limited in use of Rt hand at eval    Time  4     Period  Weeks    Status  New    Target Date  04/22/17      PT LONG TERM GOAL #3   Title  Pt will be able to complete self care activities such as bathing and brushing teeth without limited use of Rt hand    Baseline  limited due to pain at eval    Time  4    Period  Weeks    Status  New    Target Date  04/22/17      PT LONG TERM GOAL #4   Title  Avg grip strength to improve by 10lb to decrease pain effects on functional strength    Baseline  see flowsheet    Time  4    Period  Weeks    Status  New    Target Date  04/22/17             Plan - 03/22/17 1006    Clinical Impression Statement  Pt presents to PT with complaints of Rt lateral elbow pain that began about 6 months ago. 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 MRI shows tendinopathy and tearing of ligamentous structures (see report). Iontophoresis applied to lateral elbow today and will assess outcome at next vitis. Pt will benefit fromskilled PT in order to decrease pain and improve functional use of dominant hand.     Clinical Presentation  Stable    Clinical Decision Making  Low    PT Treatment/Interventions  ADLs/Self Care Home Management;Cryotherapy;Electrical Stimulation;Ultrasound;Moist Heat;Iontophoresis 4mg /ml Dexamethasone;Functional mobility training;Therapeutic activities;Therapeutic exercise;Patient/family education;Neuromuscular re-education;Manual techniques;Passive range of motion;Taping;Dry needling    PT Next Visit Plan  ionto effective?  ultrasound +ionto     PT Home Exercise Plan  ice massage BID 5 min  Consulted and Agree with Plan of Care  Patient       Patient will benefit from skilled therapeutic intervention in order to improve the following deficits and impairments:  Pain, Increased muscle spasms, Decreased activity tolerance, Decreased strength, Impaired UE functional use  Visit Diagnosis: Pain in right elbow  - Plan: PT plan of care cert/re-cert  G-Codes - 17/91/50 1022    Functional Assessment Tool Used (Outpatient Only)  objective measures, functional ability, clinical judgmeent    Functional Limitation  Carrying, moving and handling objects    Carrying, Moving and Handling Objects Current Status (V6979)  At least 40 percent but less than 60 percent impaired, limited or restricted    Carrying, Moving and Handling Objects Goal Status (Y8016)  At least 1 percent but less than 20 percent impaired, limited or restricted        Problem List Patient Active Problem List   Diagnosis Date Noted  . Prolapsed internal hemorrhoids, grade 3 06/03/2016  . Hx of adenomatous colonic polyps 05/27/2016    Ubaldo Daywalt C. Kirtis Challis PT, DPT 03/22/17 10:24 AM   [facility] [address] [place], [place], [address] Phone: [phone]   Fax:  [phone]  Name: Andrew Payne MRN: [id number] Date of Birth: [birth year]

## 2017-03-29 ENCOUNTER — Ambulatory Visit: Payer: PPO | Attending: Orthopaedic Surgery | Admitting: Physical Therapy

## 2017-03-29 ENCOUNTER — Encounter: Payer: Self-pay | Admitting: Physical Therapy

## 2017-03-29 DIAGNOSIS — M25521 Pain in right elbow: Secondary | ICD-10-CM | POA: Insufficient documentation

## 2017-03-29 NOTE — Therapy (Signed)
[place], [place], [address] Phone: [phone]   Fax:  [phone]  Physical Therapy Treatment  Patient Details  Name: Andrew Payne MRN: [id number] Date of Birth: 03-11-1946 Referring Provider: Garald Balding, MD   Encounter Date: 03/29/2017  PT End of Session - 03/29/17 2045    Visit Number  2    Number of Visits  9    Date for PT Re-Evaluation  04/22/17    Authorization Type  MCR ADVAN- kx at visit 15    PT Start Time  0931    PT Stop Time  1015    PT Time Calculation (min)  44 min    Activity Tolerance  Patient tolerated treatment well    Behavior During Therapy  [facility] for tasks assessed/performed       Past Medical History:  Diagnosis Date  . Bradycardia   . Glaucoma   . Hx of adenomatous colonic polyps 05/27/2016  . Internal hemorrhoids   . PVC's (premature ventricular contractions)     Past Surgical History:  Procedure Laterality Date  . COLONOSCOPY    . HEMORRHOID BANDING  2018  . HERNIA REPAIR    . MENISCUS REPAIR  09/2015  . TONSILLECTOMY      There were no vitals filed for this visit.  Subjective Assessment - 03/29/17 2044    Subjective  Patient reports he does not feel like the ionto did much. He feels like it may be a little worse. He is open to dry needling today.     Limitations  Standing;Walking    Currently in Pain?  No/denies    Aggravating Factors   lifting, gripping, pulling     Pain Relieving Factors  rest                       [facility] Adult PT Treatment/Exercise - 03/29/17 0001      Wrist Exercises   Forearm Supination Limitations  1lb 2x10 in pain free range     Wrist Extension Limitations  1lb x20     Other wrist exercises  gentle wrist stretch; cuing to not over stretch     Other wrist exercises  towel squeeze pain free x20       Modalities   Modalities  Ultrasound      Ultrasound   Ultrasound Location  left elbow     Ultrasound  Parameters  continuous 1.5 w/cm 2     Ultrasound Goals  Pain      Manual Therapy   Manual Therapy  Soft tissue mobilization    Soft tissue mobilization  IASTYM to lateral epicondyle; Manul trigger point release       Trigger Point Dry Needling - 03/29/17 2055    Consent Given?  Yes    Education Handout Provided  Yes    Muscles Treated Upper Body  -- ECRB good twitch noted            PT Education - 03/29/17 2045    Education provided  Yes    Education Details  reviewed the anatomy of the condition, HEP, ice massage     Person(s) Educated  Patient    Methods  Explanation;Demonstration;Tactile cues;Verbal cues    Comprehension  Verbalized understanding;Verbal cues required;Returned demonstration;Tactile cues required          PT Long Term Goals - 03/22/17 1010      PT LONG TERM GOAL #1   Title  Pt will be able to lift large cup of water using Rt hand without limitaiton by pain    Baseline  uses L at eval    Time  4    Period  Weeks    Status  New    Target Date  04/22/17      PT LONG TERM GOAL #2   Title  Pt will be able to participate in volunteer activities and use Rt hand as needed    Baseline  limited in use of Rt hand at eval    Time  4    Period  Weeks    Status  New    Target Date  04/22/17      PT LONG TERM GOAL #3   Title  Pt will be able to complete self care activities such as bathing and brushing teeth without limited use of Rt hand    Baseline  limited due to pain at eval    Time  4    Period  Weeks    Status  New    Target Date  04/22/17      PT LONG TERM GOAL #4   Title  Avg grip strength to improve by 10lb to decrease pain effects on functional strength    Baseline  see flowsheet    Time  4    Period  Weeks    Status  New    Target Date  04/22/17            Plan - 03/29/17 2046    Clinical Impression Statement  Patient tolerated treatment well. He had a good twitch response with needling. After 2 needles trigger points not  identified. He was given light threr-ex to work on at home. Therapy aslo worked on IATYM to lateral epicondyle.  He reported no increase in pain with treatment.     Clinical Presentation  Stable    Clinical Decision Making  Low    Rehab Potential  Good    PT Treatment/Interventions  ADLs/Self Care Home Management;Cryotherapy;Electrical Stimulation;Ultrasound;Moist Heat;Iontophoresis 4mg /ml Dexamethasone;Functional mobility training;Therapeutic activities;Therapeutic exercise;Patient/family education;Neuromuscular re-education;Manual techniques;Passive range of motion;Taping;Dry needling    PT Next Visit Plan  ionto effective?  ultrasound +ionto     PT Home Exercise Plan  ice massage BID 5 min    Consulted and Agree with Plan of Care  Patient       Patient will benefit from skilled therapeutic intervention in order to improve the following deficits and impairments:  Pain, Increased muscle spasms, Decreased activity tolerance, Decreased strength, Impaired UE functional use  Visit Diagnosis: Pain in right elbow     Problem List Patient Active Problem List   Diagnosis Date Noted  . Prolapsed internal hemorrhoids, grade 3 06/03/2016  . Hx of adenomatous colonic polyps 05/27/2016    Carney Living PT DPT  03/29/2017, 9:03 PM  [facility] [address] [place], [place], [address] Phone: [phone]   Fax:  [phone]  Name: Andrew Payne MRN: [id number] Date of Birth: Dec 20, 1945

## 2017-03-31 ENCOUNTER — Ambulatory Visit: Payer: PPO | Admitting: Physical Therapy

## 2017-03-31 ENCOUNTER — Encounter: Payer: Self-pay | Admitting: Physical Therapy

## 2017-03-31 DIAGNOSIS — M25521 Pain in right elbow: Secondary | ICD-10-CM

## 2017-03-31 NOTE — Therapy (Signed)
[place], [place], [address] Phone: [phone]   Fax:  [phone]  Physical Therapy Treatment  Patient Details  Name: ALVER LEETE MRN: [id number] Date of Birth: December 06, 1945 Referring Provider: Garald Balding, MD   Encounter Date: 03/31/2017  PT End of Session - 03/31/17 0757    Visit Number  3    Number of Visits  9    Date for PT Re-Evaluation  04/22/17    Authorization Type  MCR ADVAN- kx at visit 15    PT Start Time  0800    PT Stop Time  0841    PT Time Calculation (min)  41 min    Activity Tolerance  Patient tolerated treatment well    Behavior During Therapy  [facility] for tasks assessed/performed       Past Medical History:  Diagnosis Date  . Bradycardia   . Glaucoma   . Hx of adenomatous colonic polyps 05/27/2016  . Internal hemorrhoids   . PVC's (premature ventricular contractions)     Past Surgical History:  Procedure Laterality Date  . COLONOSCOPY    . HEMORRHOID BANDING  2018  . HERNIA REPAIR    . MENISCUS REPAIR  09/2015  . TONSILLECTOMY      There were no vitals filed for this visit.  Subjective Assessment - 03/31/17 0802    Subjective  Yesterday I thought it was a little better but I am not so sure this morining.     Currently in Pain?  Yes    Aggravating Factors   pain when gripping.                       [facility] Adult PT Treatment/Exercise - 03/31/17 0001      Ultrasound   Ultrasound Location  left elbow    Ultrasound Parameters  pulsed 1.0w/cm2    Ultrasound Goals  Pain      Manual Therapy   Manual Therapy  Taping    Manual therapy comments  skilled palpation and monitoring during TPDN    Soft tissue mobilization  Lt wrist extensor mechanism    Kinesiotex  -- Lt lateral epicondyle       Trigger Point Dry Needling - 03/31/17 0842    Muscles Treated Upper Body  -- wrist extensor group                PT Long Term Goals - 03/22/17 1010       PT LONG TERM GOAL #1   Title  Pt will be able to lift large cup of water using Rt hand without limitaiton by pain    Baseline  uses L at eval    Time  4    Period  Weeks    Status  New    Target Date  04/22/17      PT LONG TERM GOAL #2   Title  Pt will be able to participate in volunteer activities and use Rt hand as needed    Baseline  limited in use of Rt hand at eval    Time  4    Period  Weeks    Status  New    Target Date  04/22/17      PT LONG TERM GOAL #3   Title  Pt will be able to complete self care activities such as bathing and brushing teeth without limited use of Rt hand    Baseline  limited due  to pain at eval    Time  4    Period  Weeks    Status  New    Target Date  04/22/17      PT LONG TERM GOAL #4   Title  Avg grip strength to improve by 10lb to decrease pain effects on functional strength    Baseline  see flowsheet    Time  4    Period  Weeks    Status  New    Target Date  04/22/17            Plan - 03/31/17 0843    Clinical Impression Statement  Pt experienced short term relief after last session without lasting effects. Reported feeling pain and twitches with DN today. After pointed directly to bony prominence of lateral epicondyle for site of pain. Discussed anatomy of tearing and how trigger points in muscles affect the area as well as rationale for treatments. Will assess at next visit if lasting effects occured. Pt is going to volunteer after session today and was encouraged to limit use of Lt UE.     PT Treatment/Interventions  ADLs/Self Care Home Management;Cryotherapy;Electrical Stimulation;Ultrasound;Moist Heat;Iontophoresis 4mg /ml Dexamethasone;Functional mobility training;Therapeutic activities;Therapeutic exercise;Patient/family education;Neuromuscular re-education;Manual techniques;Passive range of motion;Taping;Dry needling    PT Next Visit Plan  assess and continue as appropriate    PT Home Exercise Plan  ice massage BID 5 min        Patient will benefit from skilled therapeutic intervention in order to improve the following deficits and impairments:  Pain, Increased muscle spasms, Decreased activity tolerance, Decreased strength, Impaired UE functional use  Visit Diagnosis: Pain in right elbow     Problem List Patient Active Problem List   Diagnosis Date Noted  . Prolapsed internal hemorrhoids, grade 3 06/03/2016  . Hx of adenomatous colonic polyps 05/27/2016    Etheridge Geil C. Ayeshia Coppin PT, DPT 03/31/17 8:47 AM   [facility] [address] [place], [place], [address] Phone: [phone]   Fax:  [phone]  Name: SHADRACH BARTUNEK MRN: [id number] Date of Birth: October 22, 1945

## 2017-04-02 ENCOUNTER — Encounter (INDEPENDENT_AMBULATORY_CARE_PROVIDER_SITE_OTHER): Payer: Self-pay | Admitting: Orthopaedic Surgery

## 2017-04-05 ENCOUNTER — Ambulatory Visit: Payer: PPO | Admitting: Physical Therapy

## 2017-04-05 ENCOUNTER — Encounter: Payer: Self-pay | Admitting: Physical Therapy

## 2017-04-05 DIAGNOSIS — M25521 Pain in right elbow: Secondary | ICD-10-CM

## 2017-04-05 NOTE — Therapy (Signed)
[place], [place], [address] Phone: [phone]   Fax:  [phone]  Physical Therapy Treatment  Patient Details  Name: Andrew Payne MRN: [id number] Date of Birth: 10-14-1945 Referring Provider: Garald Balding, MD   Encounter Date: 04/05/2017  PT End of Session - 04/05/17 1020    Visit Number  4    Number of Visits  9    Date for PT Re-Evaluation  04/22/17    Authorization Type  MCR ADVAN- kx at visit 15    PT Start Time  1015    PT Stop Time  1057    PT Time Calculation (min)  42 min    Activity Tolerance  Patient tolerated treatment well    Behavior During Therapy  [facility] for tasks assessed/performed       Past Medical History:  Diagnosis Date  . Bradycardia   . Glaucoma   . Hx of adenomatous colonic polyps 05/27/2016  . Internal hemorrhoids   . PVC's (premature ventricular contractions)     Past Surgical History:  Procedure Laterality Date  . COLONOSCOPY    . HEMORRHOID BANDING  2018  . HERNIA REPAIR    . MENISCUS REPAIR  09/2015  . TONSILLECTOMY      There were no vitals filed for this visit.  Subjective Assessment - 04/05/17 1016    Subjective  Pt. reports that "so far none of what has been done has helped". Pt. notes pain is localized to rt. lateral elbow at the attatchment of the common extensors.     Currently in Pain?  No/denies pain 7/10 with movement        [facility] Adult PT Treatment/Exercise - 04/05/17 1404      Modalities   Modalities  Ultrasound      Ultrasound   Ultrasound Location  rt. elbow/forearm    Ultrasound Parameters  continuous, 1.0 W/cm2, 3.3 MHz 4 minutes over wrist extensors, 1 MHz 4 minutes over wrist flexors in combination with gentle stretching of wrist extensors    Ultrasound Goals  Pain      Manual Therapy   Manual Therapy  Soft tissue mobilization    Manual therapy comments  Manul trigger point release x3, rt. forearm, wrist flexors     Soft  tissue mobilization  IASTYM to rt. lateral epicondyle; wrist extensors and flexors       Trigger Point Dry Needling - 04/05/17 1023    Consent Given?  Yes    Education Handout Provided  No already received     Muscles Treated Upper Body  -- Rt. wrist extensors (twitch response noted)       PT Education - 04/05/17 1026    Education provided  Yes    Education Details  pt. educated on rationale of DN and relevant muscle anatomy    Person(s) Educated  Patient    Methods  Explanation    Comprehension  Verbalized understanding       PT Long Term Goals - 03/22/17 1010      PT LONG TERM GOAL #1   Title  Pt will be able to lift large cup of water using Rt hand without limitaiton by pain    Baseline  uses L at eval    Time  4    Period  Weeks    Status  New    Target Date  04/22/17      PT LONG TERM GOAL #2   Title  Pt  will be able to participate in volunteer activities and use Rt hand as needed    Baseline  limited in use of Rt hand at eval    Time  4    Period  Weeks    Status  New    Target Date  04/22/17      PT LONG TERM GOAL #3   Title  Pt will be able to complete self care activities such as bathing and brushing teeth without limited use of Rt hand    Baseline  limited due to pain at eval    Time  4    Period  Weeks    Status  New    Target Date  04/22/17      PT LONG TERM GOAL #4   Title  Avg grip strength to improve by 10lb to decrease pain effects on functional strength    Baseline  see flowsheet    Time  4    Period  Weeks    Status  New    Target Date  04/22/17        Plan - 04/05/17 1410    Clinical Impression Statement  Pt. reports with persistent pain that has not improved since beginning therapy. Pt. notes site of pain as lateral epicondyle which is unchanged from previous sessions. Session consisted of DN and modalities for attempted pain relief. Pt. will continue to be monitored and is agreeable to discharge if no improvement is noted within next few  sessions.    PT Treatment/Interventions  ADLs/Self Care Home Management;Cryotherapy;Electrical Stimulation;Ultrasound;Moist Heat;Iontophoresis 4mg /ml Dexamethasone;Functional mobility training;Therapeutic activities;Therapeutic exercise;Patient/family education;Neuromuscular re-education;Manual techniques;Passive range of motion;Taping;Dry needling    PT Next Visit Plan  assess and continue as appropriate    PT Home Exercise Plan  ice massage BID 5 min    Consulted and Agree with Plan of Care  Patient       Patient will benefit from skilled therapeutic intervention in order to improve the following deficits and impairments:  Pain, Increased muscle spasms, Decreased activity tolerance, Decreased strength, Impaired UE functional use  Visit Diagnosis: Pain in right elbow    Problem List Patient Active Problem List   Diagnosis Date Noted  . Prolapsed internal hemorrhoids, grade 3 06/03/2016  . Hx of adenomatous colonic polyps 05/27/2016    Eulis Manly, SPT 04/05/17 3:37 PM   [facility] [address] [place], [place], [address] Phone: [phone]   Fax:  [phone]  Name: Andrew Payne MRN: [id number] Date of Birth: 07-31-1945

## 2017-04-07 ENCOUNTER — Encounter: Payer: Self-pay | Admitting: Physical Therapy

## 2017-04-07 ENCOUNTER — Encounter (INDEPENDENT_AMBULATORY_CARE_PROVIDER_SITE_OTHER): Payer: Self-pay | Admitting: Orthopaedic Surgery

## 2017-04-07 ENCOUNTER — Ambulatory Visit: Payer: PPO | Admitting: Physical Therapy

## 2017-04-07 ENCOUNTER — Ambulatory Visit (INDEPENDENT_AMBULATORY_CARE_PROVIDER_SITE_OTHER): Payer: PPO | Admitting: Orthopaedic Surgery

## 2017-04-07 VITALS — BP 118/67 | HR 57 | Resp 12 | Ht 74.0 in | Wt 175.0 lb

## 2017-04-07 DIAGNOSIS — M25521 Pain in right elbow: Secondary | ICD-10-CM

## 2017-04-07 DIAGNOSIS — M7711 Lateral epicondylitis, right elbow: Secondary | ICD-10-CM | POA: Diagnosis not present

## 2017-04-07 NOTE — Therapy (Signed)
[place], [place], [address] Phone: [phone]   Fax:  [phone]  Physical Therapy Treatment/Discharge Summary  Patient Details  Name: Andrew Payne MRN: [id number] Date of Birth: [birth year] Referring Provider: Garald Balding, MD   Encounter Date: 04/07/2017  PT End of Session - 04/07/17 0758    Visit Number  5    Number of Visits  9    Date for PT Re-Evaluation  04/22/17    Authorization Type  MCR ADVAN- kx at visit 15    PT Start Time  0800    PT Stop Time  0808    PT Time Calculation (min)  8 min    Behavior During Therapy  [facility] for tasks assessed/performed       Past Medical History:  Diagnosis Date  . Bradycardia   . Glaucoma   . Hx of adenomatous colonic polyps 05/27/2016  . Internal hemorrhoids   . PVC's (premature ventricular contractions)     Past Surgical History:  Procedure Laterality Date  . COLONOSCOPY    . HEMORRHOID BANDING  2018  . HERNIA REPAIR    . MENISCUS REPAIR  09/2015  . TONSILLECTOMY      There were no vitals filed for this visit.  Subjective Assessment - 04/07/17 0759    Subjective  Seemed to have helped for a short period but no carryover to today.     Currently in Pain?  No/denies         [facility] PT Assessment - 04/07/17 0001      Strength   Right Hand Grip (lbs)  50 50 50 pain at lateral epicondyle      Palpation   Palpation comment  pain with "thumbs up"                          PT Education - 04/07/17 0815    Education provided  Yes    Education Details  goals, POC, HEP    Person(s) Educated  Patient    Methods  Explanation    Comprehension  Verbalized understanding          PT Long Term Goals - 04/07/17 0803      PT LONG TERM GOAL #1   Title  Pt will be able to lift large cup of water using Rt hand without limitaiton by pain    Baseline  still unable due to pain    Status  Not Met      PT LONG TERM GOAL #2   Title  Pt will be able to participate in volunteer activities and use Rt hand as needed    Baseline  cont to be limited in use of Rt hand    Status  Not Met      PT LONG TERM GOAL #3   Title  Pt will be able to complete self care activities such as bathing and brushing teeth without limited use of Rt hand    Baseline  no change in limitations    Status  Not Met      PT LONG TERM GOAL #4   Title  Avg grip strength to improve by 10lb to decrease pain effects on functional strength    Baseline  see flowsheet    Status  Not Met            Plan - 04/07/17 0810    Clinical Impression Statement  At this point  pt has had no change with pain at lateral epicondyle and is returning to MD tomorrow. No response to ionto and no long term response to DN and ultrasound. Pain is localized to Rt lateral epicondyle and aggrivated by daily activities that involve use of Rt hand. Pt is being d/c today due to lack of progress and was encouraged to contact us with any further questions.     PT Treatment/Interventions  ADLs/Self Care Home Management;Cryotherapy;Electrical Stimulation;Ultrasound;Moist Heat;Iontophoresis 22m/ml Dexamethasone;Functional mobility training;Therapeutic activities;Therapeutic exercise;Patient/family education;Neuromuscular re-education;Manual techniques;Passive range of motion;Taping;Dry needling    PT Home Exercise Plan  ice massage BID 5 min, gentle stretches & continue exercises    Consulted and Agree with Plan of Care  Patient       Patient will benefit from skilled therapeutic intervention in order to improve the following deficits and impairments:  Pain, Increased muscle spasms, Decreased activity tolerance, Decreased strength, Impaired UE functional use  Visit Diagnosis: Pain in right elbow   G-Codes - [id number]    Functional Assessment Tool Used (Outpatient Only)  objective measures, functional ability, clinical judgmeent    Functional Limitation  Carrying, moving  and handling objects    Carrying, Moving and Handling Objects Goal Status (([id number]  At least 1 percent but less than 20 percent impaired, limited or restricted    Carrying, Moving and Handling Objects Discharge Status [phone]  At least 40 percent but less than 60 percent impaired, limited or restricted       Problem List Patient Active Problem List   Diagnosis Date Noted  . Prolapsed internal hemorrhoids, grade 3 06/03/2016  . Hx of adenomatous colonic polyps 05/27/2016    PHYSICAL THERAPY DISCHARGE SUMMARY  Visits from Start of Care: 5  Current functional level related to goals / functional outcomes: See above   Remaining deficits: See above   Education / Equipment: Anatomy of condition, POC, HEP, exercise form/rationale  Plan: Patient agrees to discharge.  Patient goals were not met. Patient is being discharged due to lack of progress.  ?????    Bonita Brindisi C. Kresha Abelson PT, DPT 12018-12-208:18 AM    [place] [address]Phone: [phone]  Fax:  [phone] Name: Andrew VOORHIESMRN: [id number] of Birth: [birth year]

## 2017-04-07 NOTE — Progress Notes (Signed)
Office Visit Note   Patient: Andrew Payne           Date of Birth: February 19, 1946           MRN: [id number] Visit Date: 04/07/2017              Requested by: Marton Redwood, MD [address] [place], [place] [address] PCP: Marton Redwood, MD   Assessment & Plan: Visit Diagnoses:  1. Right tennis elbow     Plan: Long discussion regarding tennis elbow, continue treatment options and surgery. Eddy's been diagnosed over the past several months. Cortisone injection in August provided only temporary relief. He's been through a full course of physical therapy and selective on his activities related to his elbow. He like to proceed with surgery so this was discussed including the outpatient nature of the incision there. Of immobilization, rehabilitation and expected results which may or may not include complete or partial relief of pain. We'll set this up sometime in January  Follow-Up Instructions: Return will schedule surgery.   Orders:  No orders of the defined types were placed in this encounter.  No orders of the defined types were placed in this encounter.     Procedures: No procedures performed   Clinical Data: No additional findings.   Subjective: Chief Complaint  Patient presents with  . Right Elbow - Pain     Andrew Payne is a 71 y o here for a F/U from PT, which did not make any difference. Cortisone ineffective    HPI  Review of Systems  Constitutional: Negative for fatigue.  HENT: Negative for hearing loss.   Respiratory: Negative for apnea, chest tightness and shortness of breath.   Cardiovascular: Negative for chest pain, palpitations and leg swelling.  Gastrointestinal: Negative for blood in stool, constipation and diarrhea.  Genitourinary: Negative for difficulty urinating.  Musculoskeletal: Negative for arthralgias, back pain, joint swelling, myalgias, neck pain and neck stiffness.  Neurological: Negative for weakness, numbness and headaches.  Hematological:  Does not bruise/bleed easily.  Psychiatric/Behavioral: Negative for sleep disturbance. The patient is not nervous/anxious.      Objective: Vital Signs: BP 118/67   Pulse (!) 57   Resp 12   Ht 6\' 2"  (1.88 m)   Wt 175 lb (79.4 kg)   BMI 22.47 kg/m   Physical Exam  Ortho Exam awake alert and oriented 3. Comfortable sitting. Exam was limited to the right elbow. Full range of motion of pronation supination flexion and extension. Tenderness is directly over the lateral epicondyle with pain with grip both in flexion and extension. Little bit of ecchymosis about the proximal lateral forearm probably related to physical therapy. Intact. No evidence of instability. A prior history of injury. No pain over lateral ulnar collateral ligament area no evidence of instability  Specialty Comments:  No specialty comments available.  Imaging: No results found.   PMFS History: Patient Active Problem List   Diagnosis Date Noted  . Right tennis elbow 04/07/2017  . Prolapsed internal hemorrhoids, grade 3 06/03/2016  . Hx of adenomatous colonic polyps 05/27/2016   Past Medical History:  Diagnosis Date  . Bradycardia   . Glaucoma   . Hx of adenomatous colonic polyps 05/27/2016  . Internal hemorrhoids   . PVC's (premature ventricular contractions)     Family History  Problem Relation Age of Onset  . Heart disease Mother   . Alzheimer's disease Father   . Colon cancer Neg Hx   . Pancreatic cancer Neg Hx   .  Prostate cancer Neg Hx   . Rectal cancer Neg Hx     Past Surgical History:  Procedure Laterality Date  . COLONOSCOPY    . HEMORRHOID BANDING  2018  . HERNIA REPAIR    . MENISCUS REPAIR  09/2015  . TONSILLECTOMY     Social History   Occupational History  . Not on file  Tobacco Use  . Smoking status: Never Smoker  . Smokeless tobacco: Never Used  Substance and Sexual Activity  . Alcohol use: No  . Drug use: No  . Sexual activity: Not on file

## 2017-04-08 ENCOUNTER — Ambulatory Visit (INDEPENDENT_AMBULATORY_CARE_PROVIDER_SITE_OTHER): Payer: PPO | Admitting: Orthopaedic Surgery

## 2017-04-12 ENCOUNTER — Encounter: Payer: PPO | Admitting: Physical Therapy

## 2017-04-14 ENCOUNTER — Encounter: Payer: PPO | Admitting: Physical Therapy

## 2017-04-21 ENCOUNTER — Encounter: Payer: PPO | Admitting: Physical Therapy

## 2017-04-26 HISTORY — PX: ELBOW SURGERY: SHX618

## 2017-04-27 ENCOUNTER — Encounter: Payer: PPO | Admitting: Physical Therapy

## 2017-05-05 DIAGNOSIS — M7711 Lateral epicondylitis, right elbow: Secondary | ICD-10-CM | POA: Diagnosis not present

## 2017-05-09 ENCOUNTER — Ambulatory Visit (INDEPENDENT_AMBULATORY_CARE_PROVIDER_SITE_OTHER): Payer: PPO | Admitting: Orthopaedic Surgery

## 2017-05-09 ENCOUNTER — Encounter (INDEPENDENT_AMBULATORY_CARE_PROVIDER_SITE_OTHER): Payer: Self-pay | Admitting: Orthopaedic Surgery

## 2017-05-09 VITALS — BP 135/87 | HR 52 | Resp 12 | Ht 72.0 in | Wt 175.0 lb

## 2017-05-09 DIAGNOSIS — Z8739 Personal history of other diseases of the musculoskeletal system and connective tissue: Secondary | ICD-10-CM

## 2017-05-09 NOTE — Progress Notes (Signed)
Office Visit Note   Patient: Andrew Payne           Date of Birth: 1946-01-13           MRN: [id number] Visit Date: 05/09/2017              Requested by: Marton Redwood, MD [address] [place], [place] [address] PCP: Marton Redwood, MD   Assessment & Plan: Visit Diagnoses:  1. History of right tennis elbow     Plan: 4 days post right  tennis elbow release. Doing well. No related fever or chills redress right elbow incision and return in 1 week. Discussed activity limitations  Follow-Up Instructions: Return in about 1 week (around 05/16/2017).   Orders:  No orders of the defined types were placed in this encounter.  No orders of the defined types were placed in this encounter.     Procedures: No procedures performed   Clinical Data: No additional findings.   Subjective: Chief Complaint  Patient presents with  . Right Elbow - Routine Post Op    Andrew Payne is a 72 y o S/P 4 days Right tennis elbow surgery. He relates no pain pills, feeling better.   No pain meds used. Only NSAIDs or Tylenol. Small clear effusion right elbow noted time of surgery. Also had significant inflammatory response about the [facility] consistent with tennis elbow  HPI  Review of Systems  Constitutional: Negative for fatigue.  HENT: Negative for hearing loss.   Respiratory: Negative for apnea, chest tightness and shortness of breath.   Cardiovascular: Negative for chest pain, palpitations and leg swelling.  Gastrointestinal: Negative for blood in stool, constipation and diarrhea.  Genitourinary: Negative for difficulty urinating.  Musculoskeletal: Negative for arthralgias, back pain, joint swelling, myalgias, neck pain and neck stiffness.  Neurological: Negative for weakness, numbness and headaches.  Hematological: Does not bruise/bleed easily.  Psychiatric/Behavioral: Negative for sleep disturbance. The patient is not nervous/anxious.      Objective: Vital Signs: BP 135/87   Pulse (!) 52    Resp 12   Ht 6' (1.829 m)   Wt 175 lb (79.4 kg)   BMI 23.73 kg/m   Physical Exam  Ortho Exam awake alert and oriented 3 comfortable sitting. Mild swelling of right hand but neurovascular exam intact. Surgical dressing removed. Wound looks fine there's been no bleeding. New dressing applied with posterior splint  Specialty Comments:  No specialty comments available.  Imaging: No results found.   PMFS History: Patient Active Problem List   Diagnosis Date Noted  . History of right tennis elbow 05/09/2017  . Right tennis elbow 04/07/2017  . Prolapsed internal hemorrhoids, grade 3 06/03/2016  . Hx of adenomatous colonic polyps 05/27/2016   Past Medical History:  Diagnosis Date  . Bradycardia   . Glaucoma   . Hx of adenomatous colonic polyps 05/27/2016  . Internal hemorrhoids   . PVC's (premature ventricular contractions)     Family History  Problem Relation Age of Onset  . Heart disease Mother   . Alzheimer's disease Father   . Colon cancer Neg Hx   . Pancreatic cancer Neg Hx   . Prostate cancer Neg Hx   . Rectal cancer Neg Hx     Past Surgical History:  Procedure Laterality Date  . COLONOSCOPY    . HEMORRHOID BANDING  2018  . HERNIA REPAIR    . MENISCUS REPAIR  09/2015  . TONSILLECTOMY     Social History   Occupational History  .  Not on file  Tobacco Use  . Smoking status: Never Smoker  . Smokeless tobacco: Never Used  Substance and Sexual Activity  . Alcohol use: No  . Drug use: No  . Sexual activity: Not on file

## 2017-05-16 ENCOUNTER — Ambulatory Visit (INDEPENDENT_AMBULATORY_CARE_PROVIDER_SITE_OTHER): Payer: PPO | Admitting: Orthopaedic Surgery

## 2017-05-16 ENCOUNTER — Encounter (INDEPENDENT_AMBULATORY_CARE_PROVIDER_SITE_OTHER): Payer: Self-pay | Admitting: Orthopaedic Surgery

## 2017-05-16 VITALS — BP 140/60 | HR 52 | Resp 14 | Ht 74.0 in | Wt 185.0 lb

## 2017-05-16 DIAGNOSIS — M7711 Lateral epicondylitis, right elbow: Secondary | ICD-10-CM

## 2017-05-16 NOTE — Progress Notes (Signed)
   Office Visit Note   Patient: Andrew Payne           Date of Birth: 1945-09-27           MRN: [id number] Visit Date: 05/16/2017              Requested by: Marton Redwood, MD [address] [place], [place] [address] PCP: Marton Redwood, MD   Assessment & Plan: Visit Diagnoses:  1. Right tennis elbow     Plan: 11 days status post right tennis elbow release and doing well. Long arm splint removed. Incisions healing nicely. Okay to shower. Limited use of his arm. Wear the sling when he is outside the house. Office 2 weeks. Range of motion exercises  Follow-Up Instructions: Return in about 2 weeks (around 05/30/2017).   Orders:  No orders of the defined types were placed in this encounter.  No orders of the defined types were placed in this encounter.     Procedures: No procedures performed   Clinical Data: No additional findings.   Subjective: Chief Complaint  Patient presents with  . Right Elbow - Routine Post Op  . Post-op Follow-up    Right elbow post op 05/05/17, some pain when raising 1st and 2nd digits, swellling, pain, bursted blood vessel x 2 since surgery    HPI  Review of Systems   Objective: Vital Signs: BP 140/60 (BP Location: Left Arm, Patient Position: Sitting, Cuff Size: Normal)   Pulse (!) 52   Resp 14   Ht 6\' 2"  (1.88 m)   Wt 185 lb (83.9 kg)   BMI 23.75 kg/m   Physical Exam  Ortho Exam awake alert and oriented 3 comfortable sitting. Splint removed from right upper extremity. Incision is healing without problem. Neurovascular exam intact. No specialty comments available.  Imaging: No results found.   PMFS History: Patient Active Problem List   Diagnosis Date Noted  . History of right tennis elbow 05/09/2017  . Right tennis elbow 04/07/2017  . Prolapsed internal hemorrhoids, grade 3 06/03/2016  . Hx of adenomatous colonic polyps 05/27/2016   Past Medical History:  Diagnosis Date  . Bradycardia   . Glaucoma   . Hx of adenomatous  colonic polyps 05/27/2016  . Internal hemorrhoids   . PVC's (premature ventricular contractions)     Family History  Problem Relation Age of Onset  . Heart disease Mother   . Alzheimer's disease Father   . Colon cancer Neg Hx   . Pancreatic cancer Neg Hx   . Prostate cancer Neg Hx   . Rectal cancer Neg Hx     Past Surgical History:  Procedure Laterality Date  . COLONOSCOPY    . ELBOW SURGERY    . HEMORRHOID BANDING  2018  . HERNIA REPAIR    . MENISCUS REPAIR  09/2015  . TONSILLECTOMY     Social History   Occupational History  . Not on file  Tobacco Use  . Smoking status: Never Smoker  . Smokeless tobacco: Never Used  Substance and Sexual Activity  . Alcohol use: No  . Drug use: No  . Sexual activity: Not on file     Garald Balding, MD   Note - This record has been created using [employer].  Chart creation errors have been sought, but may not always  have been located. Such creation errors do not reflect on  the standard of medical care.

## 2017-05-17 ENCOUNTER — Encounter (INDEPENDENT_AMBULATORY_CARE_PROVIDER_SITE_OTHER): Payer: Self-pay | Admitting: Orthopaedic Surgery

## 2017-05-24 DIAGNOSIS — K4090 Unilateral inguinal hernia, without obstruction or gangrene, not specified as recurrent: Secondary | ICD-10-CM | POA: Diagnosis not present

## 2017-05-24 DIAGNOSIS — Z6824 Body mass index (BMI) 24.0-24.9, adult: Secondary | ICD-10-CM | POA: Diagnosis not present

## 2017-06-03 ENCOUNTER — Ambulatory Visit (INDEPENDENT_AMBULATORY_CARE_PROVIDER_SITE_OTHER): Payer: PPO | Admitting: Orthopaedic Surgery

## 2017-06-03 ENCOUNTER — Encounter (INDEPENDENT_AMBULATORY_CARE_PROVIDER_SITE_OTHER): Payer: Self-pay | Admitting: Orthopaedic Surgery

## 2017-06-03 VITALS — BP 115/66 | HR 53 | Resp 14 | Ht 70.0 in | Wt 185.0 lb

## 2017-06-03 DIAGNOSIS — M7711 Lateral epicondylitis, right elbow: Secondary | ICD-10-CM

## 2017-06-03 NOTE — Progress Notes (Signed)
Office Visit Note   Patient: Andrew Payne           Date of Birth: [birth year]           MRN: [id number] Visit Date: 06/03/2017              Requested by: Marton Redwood, MD [address] [place], [place] [address] PCP: Marton Redwood, MD   Assessment & Plan: Visit Diagnoses:  1. Right tennis elbow     Plan: One month status post right tennis elbow release and doing well. Careful with his activities. Long discussion regarding activity level, exercises. We'll see in 1 month  Follow-Up Instructions: Return in about 1 month (around 07/01/2017).   Orders:  No orders of the defined types were placed in this encounter.  No orders of the defined types were placed in this encounter.     Procedures: No procedures performed   Clinical Data: No additional findings.   Subjective: Chief Complaint  Patient presents with  . Right Elbow - Routine Post Op    [name] is a 72 y o S/P 4 weeks Right right tennis elbow release and doing well. Pt relates he is waiting for hernia consult.  No significant issues related to the right elbow  HPI  Review of Systems  Constitutional: Negative for fatigue.  HENT: Negative for hearing loss.   Eyes: Positive for redness.  Respiratory: Negative for apnea, chest tightness and shortness of breath.   Cardiovascular: Negative for chest pain, palpitations and leg swelling.  Gastrointestinal: Negative for blood in stool, constipation and diarrhea.  Genitourinary: Negative for difficulty urinating.  Musculoskeletal: Negative for arthralgias, back pain, joint swelling, myalgias, neck pain and neck stiffness.  Neurological: Negative for weakness, numbness and headaches.  Hematological: Does not bruise/bleed easily.  Psychiatric/Behavioral: Negative for sleep disturbance. The patient is not nervous/anxious.      Objective: Vital Signs: BP 115/66   Pulse (!) 53   Resp 14   Ht 5\' 10"  (1.778 m)   Wt 185 lb (83.9 kg)   BMI 26.54 kg/m   Physical  Exam  Ortho Exam awake alert and oriented 3. Comfortable sitting. Full range of motion of right elbow in flexion extension pronation and supination. Tennis elbow incision healed without problem. Neurovascular exam intact. No ecchymosis or erythema.. Still has some pain with grip as expected  Specialty Comments:  No specialty comments available.  Imaging: No results found.   PMFS History: Patient Active Problem List   Diagnosis Date Noted  . History of right tennis elbow 05/09/2017  . Right tennis elbow 04/07/2017  . Prolapsed internal hemorrhoids, grade 3 06/03/2016  . Hx of adenomatous colonic polyps 05/27/2016   Past Medical History:  Diagnosis Date  . Bradycardia   . Glaucoma   . Hx of adenomatous colonic polyps 05/27/2016  . Internal hemorrhoids   . PVC's (premature ventricular contractions)     Family History  Problem Relation Age of Onset  . Heart disease Mother   . Alzheimer's disease Father   . Colon cancer Neg Hx   . Pancreatic cancer Neg Hx   . Prostate cancer Neg Hx   . Rectal cancer Neg Hx     Past Surgical History:  Procedure Laterality Date  . COLONOSCOPY    . ELBOW SURGERY    . HEMORRHOID BANDING  2018  . HERNIA REPAIR    . MENISCUS REPAIR  09/2015  . TONSILLECTOMY     Social History   Occupational History  .  Not on file  Tobacco Use  . Smoking status: Never Smoker  . Smokeless tobacco: Never Used  Substance and Sexual Activity  . Alcohol use: No  . Drug use: No  . Sexual activity: Not on file

## 2017-06-13 ENCOUNTER — Ambulatory Visit: Payer: Self-pay | Admitting: Surgery

## 2017-06-13 ENCOUNTER — Other Ambulatory Visit: Payer: Self-pay

## 2017-06-13 ENCOUNTER — Encounter (HOSPITAL_BASED_OUTPATIENT_CLINIC_OR_DEPARTMENT_OTHER): Payer: Self-pay | Admitting: *Deleted

## 2017-06-13 DIAGNOSIS — K4090 Unilateral inguinal hernia, without obstruction or gangrene, not specified as recurrent: Secondary | ICD-10-CM | POA: Diagnosis not present

## 2017-06-13 NOTE — H&P (Signed)
Fenton Malling Milliman Documented: 06/13/2017 2:08 PM Location: [facility] Patient #: [id number] DOB: [birth year] Married / Language: Cleophus Molt / Race: White Male  History of Present Illness Marcello Moores A. Corrie Reder MD; 06/13/2017 2:37 PM) Patient words: [facility]    Patient presents at the request of Dr. Lang Snow for left inguinal hernia. The patient has a one-month history of left groin pain and swelling. He is unclear how this started. He is extremely active. The pain is worse with coughing sneezing or lifting. There is an associated bulge in the left groin. He had repair of a right inguinal hernia 2011 by Dr. Redmond Pulling with the laparoscopic approach.  The patient is a 72 year old male.   Allergies Andee Poles Gerrigner, CMA; 06/13/2017 2:09 PM) No Known Drug Allergies [06/13/2017]: Allergies Reconciled  Medication History Levonne Spiller, CMA; 06/13/2017 2:10 PM) Dorzolamide HCl (2% Solution, Ophthalmic) Active. Latanoprost (0.005% Solution, Ophthalmic) Active. Vitamin D (Oral) Specific strength unknown - Active. Fish Oil (Oral) Specific strength unknown - Active. Medications Reconciled    Vitals (Danielle Gerrigner CMA; 06/13/2017 2:10 PM) 06/13/2017 2:10 PM Weight: 190.13 lb Height: 73in Body Surface Area: 2.11 m Body Mass Index: 25.08 kg/m  Temp.: 97.54F(Oral)  Pulse: 58 (Regular)  BP: 124/98 (Sitting, Left Arm, Standard)      Physical Exam (Chenee Munns A. Mirra Basilio MD; 06/13/2017 2:38 PM)  General Mental Status-Alert. General Appearance-Consistent with stated age. Hydration-Well hydrated. Voice-Normal.  Head and Neck Head-normocephalic, atraumatic with no lesions or palpable masses. Trachea-midline. Thyroid Gland Characteristics - normal size and consistency.  Chest and Lung Exam Chest and lung exam reveals -quiet, even and easy respiratory effort with no use of accessory muscles and on auscultation, normal breath sounds, no  adventitious sounds and normal vocal resonance. Inspection Chest Wall - Normal. Back - normal.  Cardiovascular Cardiovascular examination reveals -normal heart sounds, regular rate and rhythm with no murmurs and normal pedal pulses bilaterally.  Abdomen Note: Laparoscopic scars noted. Reducible left inguinal hernia. No evidence of right inguinal hernia.  Neurologic Neurologic evaluation reveals -alert and oriented x 3 with no impairment of recent or remote memory. Mental Status-Normal.  Musculoskeletal Note: Scar right elbow    Assessment & Plan (Manuel Dall A. Anda Sobotta MD; 06/13/2017 2:38 PM)  LEFT INGUINAL HERNIA (K40.90) Impression: The risk of hernia repair include bleeding, infection, organ injury, bowel injury, bladder injury, nerve injury recurrent hernia, blood clots, worsening of underlying condition, chronic pain, mesh use, open surgery, death, and the need for other operattions. Pt agrees to proceed  Current Plans You are being scheduled for surgery- Our schedulers will call you.  You should hear from our office's scheduling department within 5 working days about the location, date, and time of surgery. We try to make accommodations for patient's preferences in scheduling surgery, but sometimes the OR schedule or the surgeon's schedule prevents [place] from making those accommodations.  If you have not heard from our office [phone]) in 5 working days, call the office and ask for your surgeon's nurse.  If you have other questions about your diagnosis, plan, or surgery, call the office and ask for your surgeon's nurse.  Pt Education - Pamphlet Given - Hernia Surgery: discussed with patient and provided information. The anatomy & physiology of the abdominal wall and pelvic floor was discussed. The pathophysiology of hernias in the inguinal and pelvic region was discussed. Natural history risks such as progressive enlargement, pain, incarceration, and strangulation was  discussed. Contributors to complications such as smoking, obesity, diabetes, prior surgery, etc were  discussed.  I feel the risks of no intervention will lead to serious problems that outweigh the operative risks; therefore, I recommended surgery to reduce and repair the hernia. I explained laparoscopic techniques with possible need for an open approach. I noted usual use of mesh to patch and/or buttress hernia repair  Risks such as bleeding, infection, abscess, need for further treatment, heart attack, death, and other risks were discussed. I noted a good likelihood this will help address the problem. Goals of post-operative recovery were discussed as well. Possibility that this will not correct all symptoms was explained. I stressed the importance of low-impact activity, aggressive pain control, avoiding constipation, & not pushing through pain to minimize risk of post-operative chronic pain or injury. Possibility of reherniation was discussed. We will work to minimize complications.  An educational handout further explaining the pathology & treatment options was given as well. Questions were answered. The patient expresses understanding & wishes to proceed with surgery.  Pt Education - CCS Mesh education: discussed with patient and provided information.

## 2017-06-13 NOTE — H&P (View-Only) (Signed)
Fenton Malling Tena Documented: 06/13/2017 2:08 PM Location: [facility] Patient #: [id number] DOB: 1946-01-24 Married / Language: Cleophus Molt / Race: White Male  History of Present Illness Marcello Moores A. Kelleigh Skerritt MD; 06/13/2017 2:37 PM) Patient words: [facility]    Patient presents at the request of Dr. Lang Snow for left inguinal hernia. The patient has a one-month history of left groin pain and swelling. He is unclear how this started. He is extremely active. The pain is worse with coughing sneezing or lifting. There is an associated bulge in the left groin. He had repair of a right inguinal hernia 2011 by Dr. Redmond Pulling with the laparoscopic approach.  The patient is a 72 year old male.   Allergies Andee Poles Gerrigner, CMA; 06/13/2017 2:09 PM) No Known Drug Allergies [06/13/2017]: Allergies Reconciled  Medication History Levonne Spiller, CMA; 06/13/2017 2:10 PM) Dorzolamide HCl (2% Solution, Ophthalmic) Active. Latanoprost (0.005% Solution, Ophthalmic) Active. Vitamin D (Oral) Specific strength unknown - Active. Fish Oil (Oral) Specific strength unknown - Active. Medications Reconciled    Vitals (Danielle Gerrigner CMA; 06/13/2017 2:10 PM) 06/13/2017 2:10 PM Weight: 190.13 lb Height: 73in Body Surface Area: 2.11 m Body Mass Index: 25.08 kg/m  Temp.: 97.43F(Oral)  Pulse: 58 (Regular)  BP: 124/98 (Sitting, Left Arm, Standard)      Physical Exam (Lashuna Tamashiro A. Rustyn Conery MD; 06/13/2017 2:38 PM)  General Mental Status-Alert. General Appearance-Consistent with stated age. Hydration-Well hydrated. Voice-Normal.  Head and Neck Head-normocephalic, atraumatic with no lesions or palpable masses. Trachea-midline. Thyroid Gland Characteristics - normal size and consistency.  Chest and Lung Exam Chest and lung exam reveals -quiet, even and easy respiratory effort with no use of accessory muscles and on auscultation, normal breath sounds, no  adventitious sounds and normal vocal resonance. Inspection Chest Wall - Normal. Back - normal.  Cardiovascular Cardiovascular examination reveals -normal heart sounds, regular rate and rhythm with no murmurs and normal pedal pulses bilaterally.  Abdomen Note: Laparoscopic scars noted. Reducible left inguinal hernia. No evidence of right inguinal hernia.  Neurologic Neurologic evaluation reveals -alert and oriented x 3 with no impairment of recent or remote memory. Mental Status-Normal.  Musculoskeletal Note: Scar right elbow    Assessment & Plan (Lelah Rennaker A. Even Budlong MD; 06/13/2017 2:38 PM)  LEFT INGUINAL HERNIA (K40.90) Impression: The risk of hernia repair include bleeding, infection, organ injury, bowel injury, bladder injury, nerve injury recurrent hernia, blood clots, worsening of underlying condition, chronic pain, mesh use, open surgery, death, and the need for other operattions. Pt agrees to proceed  Current Plans You are being scheduled for surgery- Our schedulers will call you.  You should hear from our office's scheduling department within 5 working days about the location, date, and time of surgery. We try to make accommodations for patient's preferences in scheduling surgery, but sometimes the OR schedule or the surgeon's schedule prevents [place] from making those accommodations.  If you have not heard from our office [phone]) in 5 working days, call the office and ask for your surgeon's nurse.  If you have other questions about your diagnosis, plan, or surgery, call the office and ask for your surgeon's nurse.  Pt Education - Pamphlet Given - Hernia Surgery: discussed with patient and provided information. The anatomy & physiology of the abdominal wall and pelvic floor was discussed. The pathophysiology of hernias in the inguinal and pelvic region was discussed. Natural history risks such as progressive enlargement, pain, incarceration, and strangulation was  discussed. Contributors to complications such as smoking, obesity, diabetes, prior surgery, etc were  discussed.  I feel the risks of no intervention will lead to serious problems that outweigh the operative risks; therefore, I recommended surgery to reduce and repair the hernia. I explained laparoscopic techniques with possible need for an open approach. I noted usual use of mesh to patch and/or buttress hernia repair  Risks such as bleeding, infection, abscess, need for further treatment, heart attack, death, and other risks were discussed. I noted a good likelihood this will help address the problem. Goals of post-operative recovery were discussed as well. Possibility that this will not correct all symptoms was explained. I stressed the importance of low-impact activity, aggressive pain control, avoiding constipation, & not pushing through pain to minimize risk of post-operative chronic pain or injury. Possibility of reherniation was discussed. We will work to minimize complications.  An educational handout further explaining the pathology & treatment options was given as well. Questions were answered. The patient expresses understanding & wishes to proceed with surgery.  Pt Education - CCS Mesh education: discussed with patient and provided information.

## 2017-06-13 NOTE — Anesthesia Preprocedure Evaluation (Signed)
Anesthesia Evaluation  Patient identified by MRN, date of birth, ID band Patient awake    Reviewed: Allergy & Precautions, NPO status , Patient's Chart, lab work & pertinent test results  Airway Mallampati: II  TM Distance: >3 FB Neck ROM: Full    Dental no notable dental hx.    Pulmonary neg pulmonary ROS,    Pulmonary exam normal breath sounds clear to auscultation       Cardiovascular negative cardio ROS Normal cardiovascular exam Rhythm:Regular Rate:Normal     Neuro/Psych negative neurological ROS  negative psych ROS   GI/Hepatic negative GI ROS, Neg liver ROS,   Endo/Other  negative endocrine ROS  Renal/GU negative Renal ROS  negative genitourinary   Musculoskeletal negative musculoskeletal ROS (+)   Abdominal   Peds negative pediatric ROS (+)  Hematology negative hematology ROS (+)   Anesthesia Other Findings   Reproductive/Obstetrics negative OB ROS                             Anesthesia Physical Anesthesia Plan  ASA: II  Anesthesia Plan: General   Post-op Pain Management: GA combined w/ Regional for post-op pain   Induction: Intravenous  PONV Risk Score and Plan: 2 and Ondansetron, Treatment may vary due to age or medical condition and Dexamethasone  Airway Management Planned: Oral ETT  Additional Equipment:   Intra-op Plan:   Post-operative Plan: Extubation in OR  Informed Consent: I have reviewed the patients History and Physical, chart, labs and discussed the procedure including the risks, benefits and alternatives for the proposed anesthesia with the patient or authorized representative who has indicated his/her understanding and acceptance.     Plan Discussed with: Anesthesiologist and CRNA  Anesthesia Plan Comments: (  )        Anesthesia Quick Evaluation

## 2017-06-14 ENCOUNTER — Encounter (HOSPITAL_BASED_OUTPATIENT_CLINIC_OR_DEPARTMENT_OTHER): Payer: Self-pay | Admitting: Certified Registered"

## 2017-06-14 ENCOUNTER — Other Ambulatory Visit: Payer: Self-pay

## 2017-06-14 ENCOUNTER — Ambulatory Visit (HOSPITAL_BASED_OUTPATIENT_CLINIC_OR_DEPARTMENT_OTHER)
Admission: RE | Admit: 2017-06-14 | Discharge: 2017-06-14 | Disposition: A | Discharge status: Home / Self Care | Payer: PPO | Source: Ambulatory Visit | Attending: Surgery | Admitting: Surgery

## 2017-06-14 ENCOUNTER — Ambulatory Visit (HOSPITAL_BASED_OUTPATIENT_CLINIC_OR_DEPARTMENT_OTHER): Payer: PPO | Admitting: Anesthesiology

## 2017-06-14 ENCOUNTER — Encounter (HOSPITAL_BASED_OUTPATIENT_CLINIC_OR_DEPARTMENT_OTHER)
Admission: RE | Disposition: A | Discharge status: Home / Self Care | Payer: Self-pay | Source: Ambulatory Visit | Attending: Surgery

## 2017-06-14 DIAGNOSIS — K4090 Unilateral inguinal hernia, without obstruction or gangrene, not specified as recurrent: Secondary | ICD-10-CM | POA: Diagnosis not present

## 2017-06-14 DIAGNOSIS — G8918 Other acute postprocedural pain: Secondary | ICD-10-CM | POA: Diagnosis not present

## 2017-06-14 DIAGNOSIS — M7710 Lateral epicondylitis, unspecified elbow: Secondary | ICD-10-CM | POA: Diagnosis not present

## 2017-06-14 DIAGNOSIS — K642 Third degree hemorrhoids: Secondary | ICD-10-CM | POA: Diagnosis not present

## 2017-06-14 HISTORY — DX: Unspecified osteoarthritis, unspecified site: M19.90

## 2017-06-14 HISTORY — PX: INGUINAL HERNIA REPAIR: SHX194

## 2017-06-14 HISTORY — PX: INSERTION OF MESH: SHX5868

## 2017-06-14 HISTORY — DX: Unilateral inguinal hernia, without obstruction or gangrene, not specified as recurrent: K40.90

## 2017-06-14 SURGERY — REPAIR, HERNIA, INGUINAL, ADULT
Anesthesia: General | Site: Groin | Laterality: Left

## 2017-06-14 MED ORDER — FENTANYL CITRATE (PF) 100 MCG/2ML IJ SOLN
INTRAMUSCULAR | Status: AC
  Filled 2017-06-14: qty 2

## 2017-06-14 MED ORDER — CEFAZOLIN SODIUM-DEXTROSE 2-4 GM/100ML-% IV SOLN
2.0000 g | INTRAVENOUS | Status: AC
  Administered 2017-06-14: 2 g via INTRAVENOUS

## 2017-06-14 MED ORDER — LIDOCAINE 2% (20 MG/ML) 5 ML SYRINGE
INTRAMUSCULAR | Status: AC
  Filled 2017-06-14: qty 5

## 2017-06-14 MED ORDER — BUPIVACAINE-EPINEPHRINE 0.25% -1:200000 IJ SOLN
INTRAMUSCULAR | Status: DC | PRN
  Administered 2017-06-14: 10 mL

## 2017-06-14 MED ORDER — EPHEDRINE SULFATE-NACL 50-0.9 MG/10ML-% IV SOSY
PREFILLED_SYRINGE | INTRAVENOUS | Status: DC | PRN
  Administered 2017-06-14 (×4): 5 mg via INTRAVENOUS

## 2017-06-14 MED ORDER — ROCURONIUM BROMIDE 10 MG/ML (PF) SYRINGE
PREFILLED_SYRINGE | INTRAVENOUS | Status: AC
  Filled 2017-06-14: qty 5

## 2017-06-14 MED ORDER — HYDROCODONE-ACETAMINOPHEN 5-325 MG PO TABS: 1.0000 | ORAL_TABLET | Freq: Four times a day (QID) | ORAL | 0 refills | Status: DC | PRN

## 2017-06-14 MED ORDER — CHLORHEXIDINE GLUCONATE CLOTH 2 % EX PADS: 6.0000 | MEDICATED_PAD | Freq: Once | CUTANEOUS | Status: DC

## 2017-06-14 MED ORDER — LACTATED RINGERS IV SOLN
INTRAVENOUS | Status: DC
  Administered 2017-06-14 (×2): via INTRAVENOUS

## 2017-06-14 MED ORDER — SCOPOLAMINE 1 MG/3DAYS TD PT72
1.0000 | MEDICATED_PATCH | Freq: Once | TRANSDERMAL | Status: DC | PRN
  Administered 2017-06-14: 1.5 mg via TRANSDERMAL

## 2017-06-14 MED ORDER — BUPIVACAINE HCL (PF) 0.75 % IJ SOLN
INTRAMUSCULAR | Status: DC | PRN
  Administered 2017-06-14: 30 mL

## 2017-06-14 MED ORDER — ACETAMINOPHEN 500 MG PO TABS
ORAL_TABLET | ORAL | Status: AC
  Filled 2017-06-14: qty 2

## 2017-06-14 MED ORDER — ONDANSETRON HCL 4 MG/2ML IJ SOLN
INTRAMUSCULAR | Status: DC | PRN
  Administered 2017-06-14: 4 mg via INTRAVENOUS

## 2017-06-14 MED ORDER — FENTANYL CITRATE (PF) 100 MCG/2ML IJ SOLN
50.0000 ug | INTRAMUSCULAR | Status: DC | PRN
  Administered 2017-06-14: 100 ug via INTRAVENOUS

## 2017-06-14 MED ORDER — SUGAMMADEX SODIUM 200 MG/2ML IV SOLN
INTRAVENOUS | Status: DC | PRN
  Administered 2017-06-14: 200 mg via INTRAVENOUS

## 2017-06-14 MED ORDER — PROPOFOL 10 MG/ML IV BOLUS
INTRAVENOUS | Status: DC | PRN
  Administered 2017-06-14: 150 mg via INTRAVENOUS

## 2017-06-14 MED ORDER — ROCURONIUM BROMIDE 100 MG/10ML IV SOLN
INTRAVENOUS | Status: DC | PRN
  Administered 2017-06-14: 40 mg via INTRAVENOUS

## 2017-06-14 MED ORDER — LIDOCAINE 2% (20 MG/ML) 5 ML SYRINGE
INTRAMUSCULAR | Status: DC | PRN
  Administered 2017-06-14: 60 mg via INTRAVENOUS

## 2017-06-14 MED ORDER — DEXAMETHASONE SODIUM PHOSPHATE 10 MG/ML IJ SOLN
INTRAMUSCULAR | Status: DC | PRN
  Administered 2017-06-14: 10 mg via INTRAVENOUS

## 2017-06-14 MED ORDER — ACETAMINOPHEN 500 MG PO TABS
1000.0000 mg | ORAL_TABLET | ORAL | Status: AC
  Administered 2017-06-14: 1000 mg via ORAL

## 2017-06-14 MED ORDER — ONDANSETRON HCL 4 MG PO TABS: 4.0000 mg | ORAL_TABLET | Freq: Every day | ORAL | 1 refills | Status: DC | PRN

## 2017-06-14 MED ORDER — MIDAZOLAM HCL 2 MG/2ML IJ SOLN
INTRAMUSCULAR | Status: AC
  Filled 2017-06-14: qty 2

## 2017-06-14 MED ORDER — KETOROLAC TROMETHAMINE 30 MG/ML IJ SOLN
INTRAMUSCULAR | Status: AC
  Filled 2017-06-14: qty 1

## 2017-06-14 MED ORDER — BUPIVACAINE-EPINEPHRINE 0.25% -1:200000 IJ SOLN
INTRAMUSCULAR | Status: AC
  Filled 2017-06-14: qty 1

## 2017-06-14 MED ORDER — DEXTROSE 5 % IV SOLN: 3.0000 g | INTRAVENOUS | Status: DC

## 2017-06-14 MED ORDER — IBUPROFEN 800 MG PO TABS
800.0000 mg | ORAL_TABLET | Freq: Three times a day (TID) | ORAL | 0 refills | Status: DC | PRN
Start: 2017-06-14 — End: 2017-08-08

## 2017-06-14 MED ORDER — EPHEDRINE 5 MG/ML INJ
INTRAVENOUS | Status: AC
  Filled 2017-06-14: qty 10

## 2017-06-14 MED ORDER — DEXAMETHASONE SODIUM PHOSPHATE 10 MG/ML IJ SOLN
INTRAMUSCULAR | Status: AC
  Filled 2017-06-14: qty 1

## 2017-06-14 MED ORDER — MIDAZOLAM HCL 2 MG/2ML IJ SOLN
1.0000 mg | INTRAMUSCULAR | Status: DC | PRN
  Administered 2017-06-14: 2 mg via INTRAVENOUS

## 2017-06-14 MED ORDER — KETOROLAC TROMETHAMINE 15 MG/ML IJ SOLN
INTRAMUSCULAR | Status: DC | PRN
  Administered 2017-06-14: 15 mg via INTRAVENOUS

## 2017-06-14 MED ORDER — SCOPOLAMINE 1 MG/3DAYS TD PT72
MEDICATED_PATCH | TRANSDERMAL | Status: AC
  Filled 2017-06-14: qty 1

## 2017-06-14 MED ORDER — SUGAMMADEX SODIUM 200 MG/2ML IV SOLN
INTRAVENOUS | Status: AC
  Filled 2017-06-14: qty 2

## 2017-06-14 MED ORDER — CEFAZOLIN SODIUM-DEXTROSE 2-4 GM/100ML-% IV SOLN
INTRAVENOUS | Status: AC
  Filled 2017-06-14: qty 100

## 2017-06-14 MED ORDER — ONDANSETRON HCL 4 MG/2ML IJ SOLN
INTRAMUSCULAR | Status: AC
  Filled 2017-06-14: qty 2

## 2017-06-14 SURGICAL SUPPLY — 55 items
ADH SKN CLS APL DERMABOND .7 (GAUZE/BANDAGES/DRESSINGS) ×1
BLADE CLIPPER SURG (BLADE) ×2 IMPLANT
BLADE SURG 15 STRL LF DISP TIS (BLADE) ×1 IMPLANT
BLADE SURG 15 STRL SS (BLADE) ×3
CANISTER SUCT 1200ML W/VALVE (MISCELLANEOUS) IMPLANT
CHLORAPREP W/TINT 26ML (MISCELLANEOUS) ×3 IMPLANT
COVER BACK TABLE 60X90IN (DRAPES) ×3 IMPLANT
COVER MAYO STAND STRL (DRAPES) ×3 IMPLANT
DECANTER SPIKE VIAL GLASS SM (MISCELLANEOUS) IMPLANT
DERMABOND ADVANCED (GAUZE/BANDAGES/DRESSINGS) ×2
DERMABOND ADVANCED .7 DNX12 (GAUZE/BANDAGES/DRESSINGS) ×1 IMPLANT
DRAIN PENROSE 1/2X12 LTX STRL (WOUND CARE) ×3 IMPLANT
DRAPE LAPAROTOMY TRNSV 102X78 (DRAPE) ×3 IMPLANT
DRAPE UTILITY XL STRL (DRAPES) ×3 IMPLANT
ELECT COATED BLADE 2.86 ST (ELECTRODE) ×5 IMPLANT
ELECT REM PT RETURN 9FT ADLT (ELECTROSURGICAL) ×3
ELECTRODE REM PT RTRN 9FT ADLT (ELECTROSURGICAL) ×1 IMPLANT
GAUZE SPONGE 4X4 12PLY STRL LF (GAUZE/BANDAGES/DRESSINGS) IMPLANT
GAUZE SPONGE 4X4 16PLY XRAY LF (GAUZE/BANDAGES/DRESSINGS) IMPLANT
GLOVE BIO SURGEON STRL SZ 6.5 (GLOVE) ×1 IMPLANT
GLOVE BIO SURGEONS STRL SZ 6.5 (GLOVE) ×1
GLOVE BIOGEL PI IND STRL 7.0 (GLOVE) IMPLANT
GLOVE BIOGEL PI IND STRL 8 (GLOVE) ×1 IMPLANT
GLOVE BIOGEL PI INDICATOR 7.0 (GLOVE) ×2
GLOVE BIOGEL PI INDICATOR 8 (GLOVE) ×2
GLOVE ECLIPSE 8.0 STRL XLNG CF (GLOVE) ×3 IMPLANT
GLOVE EXAM NITRILE MD LF STRL (GLOVE) ×2 IMPLANT
GOWN STRL REUS W/ TWL LRG LVL3 (GOWN DISPOSABLE) ×2 IMPLANT
GOWN STRL REUS W/ TWL XL LVL3 (GOWN DISPOSABLE) IMPLANT
GOWN STRL REUS W/TWL LRG LVL3 (GOWN DISPOSABLE) ×3
GOWN STRL REUS W/TWL XL LVL3 (GOWN DISPOSABLE) ×3
MESH HERNIA SYS ULTRAPRO LRG (Mesh General) ×2 IMPLANT
NDL HYPO 25X1 1.5 SAFETY (NEEDLE) ×1 IMPLANT
NEEDLE HYPO 25X1 1.5 SAFETY (NEEDLE) ×3 IMPLANT
NS IRRIG 1000ML POUR BTL (IV SOLUTION) ×2 IMPLANT
PACK BASIN DAY SURGERY FS (CUSTOM PROCEDURE TRAY) ×3 IMPLANT
PENCIL BUTTON HOLSTER BLD 10FT (ELECTRODE) ×5 IMPLANT
SLEEVE SCD COMPRESS KNEE MED (MISCELLANEOUS) ×3 IMPLANT
SPONGE LAP 4X18 X RAY DECT (DISPOSABLE) ×3 IMPLANT
SUT MON AB 4-0 PC3 18 (SUTURE) ×3 IMPLANT
SUT NOVA 0 T19/GS 22DT (SUTURE) ×6 IMPLANT
SUT VIC AB 0 SH 27 (SUTURE) ×3 IMPLANT
SUT VIC AB 2-0 SH 27 (SUTURE) ×3
SUT VIC AB 2-0 SH 27XBRD (SUTURE) ×1 IMPLANT
SUT VIC AB 3-0 54X BRD REEL (SUTURE) IMPLANT
SUT VIC AB 3-0 BRD 54 (SUTURE)
SUT VICRYL 3-0 CR8 SH (SUTURE) ×3 IMPLANT
SUT VICRYL AB 2 0 TIE (SUTURE) IMPLANT
SUT VICRYL AB 2 0 TIES (SUTURE)
SYR CONTROL 10ML LL (SYRINGE) ×3 IMPLANT
TOWEL OR 17X24 6PK STRL BLUE (TOWEL DISPOSABLE) ×3 IMPLANT
TOWEL OR NON WOVEN STRL DISP B (DISPOSABLE) ×3 IMPLANT
TUBE CONNECTING 20'X1/4 (TUBING)
TUBE CONNECTING 20X1/4 (TUBING) IMPLANT
YANKAUER SUCT BULB TIP NO VENT (SUCTIONS) IMPLANT

## 2017-06-14 NOTE — Progress Notes (Signed)
Assisted Dr. Oddono with left, ultrasound guided, transabdominal plane block. Side rails up, monitors on throughout procedure. See vital signs in flow sheet. Tolerated Procedure well. 

## 2017-06-14 NOTE — Anesthesia Postprocedure Evaluation (Signed)
Anesthesia Post Note  Patient: Andrew Payne  Procedure(s) Performed: LEFT INGUINAL HERNIA REPAIR ERAS PATHWAY (Left Groin) INSERTION OF MESH (Left Groin)     Patient location during evaluation: PACU Anesthesia Type: General Level of consciousness: awake and alert Pain management: pain level controlled Vital Signs Assessment: post-procedure vital signs reviewed and stable Respiratory status: spontaneous breathing, nonlabored ventilation, respiratory function stable and patient connected to nasal cannula oxygen Cardiovascular status: blood pressure returned to baseline and stable Postop Assessment: no apparent nausea or vomiting Anesthetic complications: no    Last Vitals:  Vitals:   06/14/17 1530 06/14/17 1545  BP: 129/72 133/71  Pulse: (!) 48 (!) 43  Resp: 14 18  Temp:    SpO2: 99% 98%    Last Pain:  Vitals:   06/14/17 1545  TempSrc:   PainSc: 3                  Daimian Sudberry

## 2017-06-14 NOTE — Discharge Instructions (Signed)
[facility] _______[facility]  UMBILICAL OR INGUINAL HERNIA REPAIR: POST OP INSTRUCTIONS  Always review your discharge instruction sheet given to you by the facility where your surgery was performed. IF YOU HAVE DISABILITY OR FAMILY LEAVE FORMS, YOU MUST BRING THEM TO THE OFFICE FOR PROCESSING.   DO NOT GIVE THEM TO YOUR DOCTOR.  1. A  prescription for pain medication may be given to you upon discharge.  Take your pain medication as prescribed, if needed.  If narcotic pain medicine is not needed, then you may take acetaminophen (Tylenol) or ibuprofen (Advil) as needed. 2. Take your usually prescribed medications unless otherwise directed. NO TYLENOL UNTIL 6:30pm IF NEEDED. NO IBUPROFEN UNTIL 11:00pm IF NEEDED. If you need a refill on your pain medication, please contact your pharmacy.  They will contact our office to request authorization. Prescriptions will not be filled after 5 pm or on week-ends. 3. You should follow a light diet the first 24 hours after arrival home, such as soup and crackers, etc.  Be sure to include lots of fluids daily.  Resume your normal diet the day after surgery. 4.Most patients will experience some swelling and bruising around the umbilicus or in the groin and scrotum.  Ice packs and reclining will help.  Swelling and bruising can take several days to resolve.  6. It is common to experience some constipation if taking pain medication after surgery.  Increasing fluid intake and taking a stool softener (such as Colace) will usually help or prevent this problem from occurring.  A mild laxative (Milk of Magnesia or Miralax) should be taken according to package directions if there are no bowel movements after 48 hours. 7. Unless discharge instructions indicate otherwise, you may remove your bandages 24-48 hours after surgery, and you may shower at that time.  You may have steri-strips (small skin tapes) in place directly over the incision.  These strips should be left  on the skin for 7-10 days.  If your surgeon used skin glue on the incision, you may shower in 24 hours.  The glue will flake off over the next 2-3 weeks.  Any sutures or staples will be removed at the office during your follow-up visit. 8. ACTIVITIES:  You may resume regular (light) daily activities beginning the next day--such as daily self-care, walking, climbing stairs--gradually increasing activities as tolerated.  You may have sexual intercourse when it is comfortable.  Refrain from any heavy lifting or straining until approved by your doctor.  a.You may drive when you are no longer taking prescription pain medication, you can comfortably wear a seatbelt, and you can safely maneuver your car and apply brakes. b.RETURN TO WORK:   _____________________________________________  9.You should see your doctor in the office for a follow-up appointment approximately 2-3 weeks after your surgery.  Make sure that you call for this appointment within a day or two after you arrive home to insure a convenient appointment time. 10.OTHER INSTRUCTIONS: _________________________    _____________________________________  WHEN TO CALL YOUR DOCTOR: 1. Fever over 101.0 2. Inability to urinate 3. Nausea and/or vomiting 4. Extreme swelling or bruising 5. Continued bleeding from incision. 6. Increased pain, redness, or drainage from the incision  The clinic staff is available to answer your questions during regular business hours.  Please dont hesitate to call and ask to speak to one of the nurses for clinical concerns.  If you have a medical emergency, go to the nearest emergency room or call 911.  A surgeon from  [facility] is always on call at the hospital   [address], [place], [place], [place]  [address] ?  [address], [place], [place]   [address] [phone] ? [phone] ? FAX [phone] Web site: [email or web address]   Post Anesthesia Home Care  Instructions  Activity: Get plenty of rest for the remainder of the day. A responsible individual must stay with you for 24 hours following the procedure.  For the next 24 hours, DO NOT: -Drive a car -Paediatric nurse -Drink alcoholic beverages -Take any medication unless instructed by your physician -Make any legal decisions or sign important papers.  Meals: Start with liquid foods such as gelatin or soup. Progress to regular foods as tolerated. Avoid greasy, spicy, heavy foods. If nausea and/or vomiting occur, drink only clear liquids until the nausea and/or vomiting subsides. Call your physician if vomiting continues.  Special Instructions/Symptoms: Your throat may feel dry or sore from the anesthesia or the breathing tube placed in your throat during surgery. If this causes discomfort, gargle with warm salt water. The discomfort should disappear within 24 hours.  If you had a scopolamine patch placed behind your ear for the management of post- operative nausea and/or vomiting:  1. The medication in the patch is effective for 72 hours, after which it should be removed.  Wrap patch in a tissue and discard in the trash. Wash hands thoroughly with soap and water. 2. You may remove the patch earlier than 72 hours if you experience unpleasant side effects which may include dry mouth, dizziness or visual disturbances. 3. Avoid touching the patch. Wash your hands with soap and water after contact with the patch.

## 2017-06-14 NOTE — Anesthesia Procedure Notes (Signed)
Anesthesia Regional Block: TAP block   Pre-Anesthetic Checklist: ,, timeout performed, Correct Patient, Correct Site, Correct Laterality, Correct Procedure, Correct Position, site marked, Risks and benefits discussed,  Surgical consent,  Pre-op evaluation,  At surgeon's request and post-op pain management  Laterality: Left  Prep: chloraprep       Needles:  Injection technique: Single-shot  Needle Type: Echogenic Stimulator Needle     Needle Length: 5cm  Needle Gauge: 22     Additional Needles:   Procedures:, nerve stimulator,,, ultrasound used (permanent image in chart),,,,  Narrative:  Start time: 06/14/2017 1:58 PM End time: 06/14/2017 1:05 PM Injection made incrementally with aspirations every 5 mL.  Performed by: Personally  Anesthesiologist: Janeece Riggers, MD  Additional Notes: Functioning IV was confirmed and monitors were applied.  A 27mm 22ga Arrow echogenic stimulator needle was used. Sterile prep and drape,hand hygiene and sterile gloves were used. Ultrasound guidance: relevant anatomy identified, needle position confirmed, local anesthetic spread visualized around nerve(s)., vascular puncture avoided.  Image printed for medical record. Negative aspiration and negative test dose prior to incremental administration of local anesthetic. The patient tolerated the procedure well.

## 2017-06-14 NOTE — Interval H&P Note (Signed)
History and Physical Interval Note:  06/14/2017 1:18 PM  Andrew Payne  has presented today for surgery, with the diagnosis of LEFT INGUINAL HERNIA  The various methods of treatment have been discussed with the patient and family. After consideration of risks, benefits and other options for treatment, the patient has consented to  Procedure(s): [place] (Left) INSERTION OF MESH (Left) as a surgical intervention .  The patient's history has been reviewed, patient examined, no change in status, stable for surgery.  I have reviewed the patient's chart and labs.  Questions were answered to the patient's satisfaction.     [place]

## 2017-06-14 NOTE — Op Note (Signed)
Andrew Payne [facility] Dec 26, 1945 [id number] 06/14/2017  Preoperative diagnosis: Left inguinal hernia reducible  Postoperative diagnosis: Same  Procedure: Repair of left inguinal hernia with mesh  Surgeon: Erroll Luna, MD, FACS  Anesthesia:GA combined with regional for post-op pain  Clinical History and Indications: Patient presents for a left inguinal hernia that is symptomatic.  Risk, benefits and other options of surgery were discussed.  Options of laparoscopic versus open and the use of mesh and the pros and cons of mesh use were discussed.  He agreed to proceed.The risk of hernia repair include bleeding,  Infection,   Recurrence of the hernia,  Mesh use, chronic pain,  Organ injury,  Bowel injury,  Bladder injury,   nerve injury with numbness around the incision,  Death,  and worsening of preexisting  medical problems.  The alternatives to surgery have been discussed as well..  Long term expectations of both operative and non operative treatments have been discussed.   The patient agrees to proceed.  Description of Procedure:The patient was seen in the holding area and the plans for the procedure as noted above confirmed with the patient. We reviewed again the risks and complications and the patient had no further questions. I then marked the left  as the operative side. This was confirmed with the patient. He wishes to proceed.  The patient underwent a block per anesthesia.  The patient was taken to the operating room and after satisfactory general anesthesia was obtained the left  inguinal area was prepped and draped as a sterile field. A time out was done.  I used an equal mixture of 0.25% plain Marcaine for local anesthesia and to help with postoperative pain management. The area of the incision was infiltrated first.   An oblique incision was made and deepened to the external oblique aponeurosis. Bleeders were either cauterized or tied with 3-0 Vicryl. The external oblique aponeurosis was  opened in the line of its fibers and elevated off of the underlying tissue. The ilioinguinal nerve was noted and protected.  The spermatic cord was then dissected up off of the inguinal floor and surrounded with a drain.  The patient had both direct and indirect defect.  The indirect defect was dissected off the cord structures and reduced back in the preperitoneal space.  I then took some Prolene ultra pro hernia system mesh and cut it to shape.  The inner leaflet was placed in the preperitoneal space and deployed.  The onlay was then placed onto the floor the inguinal canal.  It was anchored at the pubic tubercle and a interrupted 2-0 Novofil used to suture it to the edge of the inferior shelving edge of the external oblique. It was split laterally to go around the cord and then laid gently over the internal oblique medially. Several more sutures of 2-0 Novofil were used to anchor the mesh to the internal oblique. The slit was closed along the cord structures with ample room for the cord structures with 2-0 Novafil.  The ilioinguinal nerve was swept medially out of the operative field.  This appeared to produce a nice coverage and repair with no tension. There was adequate space for the cord structures to exit through the mesh and deep ring.  I checked to make sure everything was dry.  The patient had good hemostasis. The incision was then closed with a running 2-0 Vicryl on the external oblique, closing it over the repair. Scarpa's fascia was closed with a running 3-0 Vicryl and the skin with  a running 4-0 Monocryl subcuticular and Dermabond on the skin.  The patient tolerated the procedure well. There were no operative complications. There was minimal blood loss. All counts were correct.  The patient was taken to recovery in satisfactory condition.  Erroll Luna, MD, FACS 06/14/2017 3:19 PM

## 2017-06-14 NOTE — Anesthesia Procedure Notes (Signed)
Procedure Name: Intubation Date/Time: 06/14/2017 2:01 PM Performed by: Gwyndolyn Saxon, CRNA Pre-anesthesia Checklist: Patient identified, Emergency Drugs available, Suction available, Patient being monitored and Timeout performed Patient Re-evaluated:Patient Re-evaluated prior to induction Oxygen Delivery Method: Circle system utilized Preoxygenation: Pre-oxygenation with 100% oxygen Induction Type: IV induction Ventilation: Mask ventilation without difficulty Laryngoscope Size: Mac and 3 Grade View: Grade I Tube type: Oral Tube size: 8.0 mm Number of attempts: 1 Placement Confirmation: ETT inserted through vocal cords under direct vision,  positive ETCO2,  CO2 detector and breath sounds checked- equal and bilateral Secured at: 23 cm Tube secured with: Tape Dental Injury: Teeth and Oropharynx as per pre-operative assessment

## 2017-06-14 NOTE — Transfer of Care (Signed)
Immediate Anesthesia Transfer of Care Note  Patient: Andrew Payne  Procedure(s) Performed: LEFT INGUINAL HERNIA REPAIR ERAS PATHWAY (Left Groin) INSERTION OF MESH (Left Groin)  Patient Location: PACU  Anesthesia Type:General  Level of Consciousness: drowsy  Airway & Oxygen Therapy: Patient Spontanous Breathing and Patient connected to face mask oxygen  Post-op Assessment: Report given to RN and Post -op Vital signs reviewed and stable  Post vital signs: Reviewed and stable  Last Vitals:  Vitals:   06/14/17 1320 06/14/17 1522  BP:  139/75  Pulse: (!) 50 (!) 51  Resp: 20 14  Temp:    SpO2: 97% 96%    Last Pain:  Vitals:   06/14/17 1238  TempSrc: Oral  PainSc: 0-No pain      Patients Stated Pain Goal: 3 (56/86/16 8372)  Complications: No apparent anesthesia complications

## 2017-06-15 ENCOUNTER — Encounter (HOSPITAL_BASED_OUTPATIENT_CLINIC_OR_DEPARTMENT_OTHER): Payer: Self-pay | Admitting: Surgery

## 2017-06-15 DIAGNOSIS — D1801 Hemangioma of skin and subcutaneous tissue: Secondary | ICD-10-CM | POA: Diagnosis not present

## 2017-06-15 DIAGNOSIS — L821 Other seborrheic keratosis: Secondary | ICD-10-CM | POA: Diagnosis not present

## 2017-06-15 DIAGNOSIS — L570 Actinic keratosis: Secondary | ICD-10-CM | POA: Diagnosis not present

## 2017-06-15 DIAGNOSIS — D225 Melanocytic nevi of trunk: Secondary | ICD-10-CM | POA: Diagnosis not present

## 2017-06-15 DIAGNOSIS — D2262 Melanocytic nevi of left upper limb, including shoulder: Secondary | ICD-10-CM | POA: Diagnosis not present

## 2017-06-30 ENCOUNTER — Encounter (INDEPENDENT_AMBULATORY_CARE_PROVIDER_SITE_OTHER): Payer: Self-pay | Admitting: Orthopaedic Surgery

## 2017-06-30 ENCOUNTER — Ambulatory Visit (INDEPENDENT_AMBULATORY_CARE_PROVIDER_SITE_OTHER): Payer: PPO | Admitting: Orthopaedic Surgery

## 2017-06-30 VITALS — BP 114/58 | HR 53 | Ht 74.0 in | Wt 186.0 lb

## 2017-06-30 DIAGNOSIS — Z8739 Personal history of other diseases of the musculoskeletal system and connective tissue: Secondary | ICD-10-CM

## 2017-06-30 NOTE — Progress Notes (Signed)
Office Visit Note   Patient: Andrew Payne           Date of Birth: [birth year]           MRN: [id number] Visit Date: 06/30/2017              Requested by: Marton Redwood, MD [address] [place], [place] [address] PCP: Marton Redwood, MD   Assessment & Plan: Visit Diagnoses:  1. History of right lateral epicondylitis     Plan:  #1: Continue guarded activities for the next 2-3 weeks.  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  At 3 months probably full activity without difficulties.  Follow-Up Instructions: No Follow-up on file.   Orders:  No orders of the defined types were placed in this encounter.  No orders of the defined types were placed in this encounter.     Procedures: No procedures performed   Clinical Data: No additional findings.   Subjective: Chief Complaint  Patient presents with  . Right Elbow - Routine Post Op    P/O R TENNIS ELBOW F/U . PT NOT HAVING ANY ISSUES    HPI  Andrew Payne is now 2 months status post right lateral epicondylar debridement and repair.  Doing very well overall.  He would like to get back to playing golf.  He just had a hernia operation on the left will be certainly limited for at least 2 more weeks.  Denies any neurovascular compromise.  Review of Systems  Constitutional: Negative for fatigue and fever.  HENT: Negative for ear pain and tinnitus.   Eyes: Positive for pain.  Respiratory: Negative for cough and shortness of breath.   Cardiovascular: Negative for chest pain, palpitations and leg swelling.  Gastrointestinal: Negative for blood in stool, constipation and diarrhea.  Genitourinary: Positive for dysuria.  Musculoskeletal: Negative for back pain and neck pain.  Skin: Negative for rash and wound.  Allergic/Immunologic: Negative for food allergies.  Neurological: Negative for dizziness, weakness, light-headedness, numbness and headaches.  Hematological: Does not bruise/bleed easily.  Psychiatric/Behavioral:  Negative for sleep disturbance.     Objective: Vital Signs: BP (!) 114/58 (BP Location: Left Arm, Patient Position: Sitting, Cuff Size: Normal)   Pulse (!) 53   Ht 6\' 2"  (1.88 m)   Wt 186 lb (84.4 kg)   BMI 23.88 kg/m   Physical Exam  Constitutional: He is oriented to person, place, and time. He appears well-developed and well-nourished.  HENT:  Head: Normocephalic and atraumatic.  Eyes: EOM are normal. Pupils are equal, round, and reactive to light.  Pulmonary/Chest: Effort normal.  Neurological: He is alert and oriented to person, place, and time.  Skin: Skin is warm and dry.  Psychiatric: He has a normal mood and affect. His behavior is normal. Judgment and thought content normal.    Ortho Exam  Today he has a well healed the lateral epicondylar incision.  Barely see the scar.  No pain to palpation over the area of surgery.  He has no pain with flexion, extension, pronation, or supination.  Minimal if any pain with a resistance of dorsiflexion of the wrist with the arm in full extension.  Vas intact distally.  Specialty Comments:  No specialty comments available.  Imaging: No results found.   PMFS History: Patient Active Problem List   Diagnosis Date Noted  . History of right tennis elbow 05/09/2017  . Right tennis elbow 04/07/2017  . Prolapsed internal hemorrhoids, grade 3 06/03/2016  . Hx of  adenomatous colonic polyps 05/27/2016   Past Medical History:  Diagnosis Date  . Arthritis    wrist, elbow, thumb, knee  . Bradycardia   . Glaucoma   . Hx of adenomatous colonic polyps 05/27/2016  . Inguinal hernia    left  . Internal hemorrhoids   . PVC's (premature ventricular contractions)     Family History  Problem Relation Age of Onset  . Heart disease Mother   . Alzheimer's disease Father   . Colon cancer Neg Hx   . Pancreatic cancer Neg Hx   . Prostate cancer Neg Hx   . Rectal cancer Neg Hx     Past Surgical History:  Procedure Laterality Date  .  COLONOSCOPY    . ELBOW SURGERY    . HEMORRHOID BANDING  2018  . HERNIA REPAIR Right   . INGUINAL HERNIA REPAIR Left 06/14/2017   Procedure: LEFT INGUINAL HERNIA REPAIR ERAS PATHWAY;  Surgeon: Erroll Luna, MD;  Location: [facility];  Service: General;  Laterality: Left;  . INSERTION OF MESH Left 06/14/2017   Procedure: INSERTION OF MESH;  Surgeon: Erroll Luna, MD;  Location: [facility];  Service: General;  Laterality: Left;  . MENISCUS REPAIR  09/2015  . TONSILLECTOMY     Social History   Occupational History  . Not on file  Tobacco Use  . Smoking status: Never Smoker  . Smokeless tobacco: Never Used  Substance and Sexual Activity  . Alcohol use: Yes    Comment: rare  . Drug use: No  . Sexual activity: Not on file

## 2017-07-01 ENCOUNTER — Ambulatory Visit (INDEPENDENT_AMBULATORY_CARE_PROVIDER_SITE_OTHER): Payer: PPO | Admitting: Orthopaedic Surgery

## 2017-07-01 DIAGNOSIS — H5212 Myopia, left eye: Secondary | ICD-10-CM | POA: Diagnosis not present

## 2017-07-01 DIAGNOSIS — H401111 Primary open-angle glaucoma, right eye, mild stage: Secondary | ICD-10-CM | POA: Diagnosis not present

## 2017-07-01 DIAGNOSIS — H52221 Regular astigmatism, right eye: Secondary | ICD-10-CM | POA: Diagnosis not present

## 2017-08-07 ENCOUNTER — Other Ambulatory Visit: Payer: Self-pay

## 2017-08-07 ENCOUNTER — Observation Stay (HOSPITAL_COMMUNITY)
Admission: EM | Admit: 2017-08-07 | Discharge: 2017-08-08 | Disposition: A | Discharge status: Home / Self Care | Payer: PPO | Attending: Internal Medicine | Admitting: Internal Medicine

## 2017-08-07 ENCOUNTER — Encounter (HOSPITAL_COMMUNITY): Payer: Self-pay | Admitting: Emergency Medicine

## 2017-08-07 ENCOUNTER — Emergency Department (HOSPITAL_COMMUNITY): Payer: PPO

## 2017-08-07 DIAGNOSIS — R079 Chest pain, unspecified: Secondary | ICD-10-CM | POA: Diagnosis present

## 2017-08-07 DIAGNOSIS — R001 Bradycardia, unspecified: Secondary | ICD-10-CM | POA: Insufficient documentation

## 2017-08-07 DIAGNOSIS — R072 Precordial pain: Secondary | ICD-10-CM

## 2017-08-07 DIAGNOSIS — R0789 Other chest pain: Principal | ICD-10-CM | POA: Insufficient documentation

## 2017-08-07 DIAGNOSIS — R61 Generalized hyperhidrosis: Secondary | ICD-10-CM | POA: Diagnosis not present

## 2017-08-07 DIAGNOSIS — M79602 Pain in left arm: Secondary | ICD-10-CM | POA: Diagnosis not present

## 2017-08-07 LAB — CBC
HEMATOCRIT: 44.7 % (ref 39.0–52.0)
Hemoglobin: 15.4 g/dL (ref 13.0–17.0)
MCH: 33.8 pg (ref 26.0–34.0)
MCHC: 34.5 g/dL (ref 30.0–36.0)
MCV: 98.2 fL (ref 78.0–100.0)
Platelets: 185 10*3/uL (ref 150–400)
RBC: 4.55 MIL/uL (ref 4.22–5.81)
RDW: 14.5 % (ref 11.5–15.5)
WBC: 3.6 10*3/uL — ABNORMAL LOW (ref 4.0–10.5)

## 2017-08-07 LAB — BASIC METABOLIC PANEL
Anion gap: 9 (ref 5–15)
BUN: 14 mg/dL (ref 6–20)
CO2: 25 mmol/L (ref 22–32)
Calcium: 9.2 mg/dL (ref 8.9–10.3)
Chloride: 104 mmol/L (ref 101–111)
Creatinine, Ser: 1.18 mg/dL (ref 0.61–1.24)
GFR calc Af Amer: >60 mL/min (ref >60)
GLUCOSE: 100 mg/dL — AB (ref 65–99)
POTASSIUM: 4.2 mmol/L (ref 3.5–5.1)
Sodium: 138 mmol/L (ref 135–145)

## 2017-08-07 LAB — I-STAT TROPONIN, ED: Troponin i, poc: 0 ng/mL (ref 0.00–0.08)

## 2017-08-07 LAB — TROPONIN I

## 2017-08-07 MED ORDER — SODIUM CHLORIDE 0.9 % IV SOLN: 250.0000 mL | INTRAVENOUS | Status: DC | PRN

## 2017-08-07 MED ORDER — ATORVASTATIN CALCIUM 80 MG PO TABS
80.0000 mg | ORAL_TABLET | Freq: Every day | ORAL | Status: DC
  Administered 2017-08-07: 80 mg via ORAL
  Filled 2017-08-07: qty 1

## 2017-08-07 MED ORDER — SODIUM CHLORIDE 0.9% FLUSH: 3.0000 mL | INTRAVENOUS | Status: DC | PRN

## 2017-08-07 MED ORDER — DORZOLAMIDE HCL 2 % OP SOLN
1.0000 [drp] | Freq: Every day | OPHTHALMIC | Status: DC
  Filled 2017-08-07: qty 10

## 2017-08-07 MED ORDER — ACETAMINOPHEN 650 MG RE SUPP
650.0000 mg | Freq: Four times a day (QID) | RECTAL | Status: DC | PRN
Start: 2017-08-07 — End: 2017-08-08

## 2017-08-07 MED ORDER — ASPIRIN EC 81 MG PO TBEC
324.0000 mg | DELAYED_RELEASE_TABLET | Freq: Once | ORAL | Status: DC
  Filled 2017-08-07: qty 4

## 2017-08-07 MED ORDER — ACETAMINOPHEN 325 MG PO TABS
650.0000 mg | ORAL_TABLET | Freq: Four times a day (QID) | ORAL | Status: DC | PRN
Start: 2017-08-07 — End: 2017-08-08

## 2017-08-07 MED ORDER — LATANOPROST 0.005 % OP SOLN
1.0000 [drp] | Freq: Every day | OPHTHALMIC | Status: DC
  Filled 2017-08-07: qty 2.5

## 2017-08-07 MED ORDER — NITROGLYCERIN 2 % TD OINT
0.5000 [in_us] | TOPICAL_OINTMENT | Freq: Three times a day (TID) | TRANSDERMAL | Status: DC
  Filled 2017-08-07 (×2): qty 30

## 2017-08-07 MED ORDER — ENOXAPARIN SODIUM 40 MG/0.4ML ~~LOC~~ SOLN
40.0000 mg | SUBCUTANEOUS | Status: DC
  Administered 2017-08-07: 40 mg via SUBCUTANEOUS
  Filled 2017-08-07: qty 0.4

## 2017-08-07 MED ORDER — SODIUM CHLORIDE 0.9% FLUSH
3.0000 mL | Freq: Two times a day (BID) | INTRAVENOUS | Status: DC
  Administered 2017-08-07: 3 mL via INTRAVENOUS

## 2017-08-07 NOTE — ED Provider Notes (Signed)
Emergency Department Provider Note   I have reviewed the triage vital signs and the nursing notes.   HISTORY  Chief Complaint Chest Pain   HPI Andrew Payne is a 72 y.o. male with past medical history of bradycardia and borderline hyperlipidemia presents to the emergency department with chest pain, diaphoresis, left arm discomfort which began this morning at home and lasted for approximately 1 hour.  The patient took 4 baby aspirin and noted the pain resolved without other intervention.  He is not currently having chest pain or shortness of breath.  No palpitations.  No history of coronary artery disease.  He has a remote history of seen cardiology for bradycardia but no intervention at that time.  Past Medical History:  Diagnosis Date  . Arthritis    wrist, elbow, thumb, knee  . Bradycardia   . Glaucoma   . Hx of adenomatous colonic polyps 05/27/2016  . Inguinal hernia    left  . Internal hemorrhoids   . PVC's (premature ventricular contractions)     Patient Active Problem List   Diagnosis Date Noted  . Chest pain 08/07/2017  . Bradycardia 08/07/2017  . History of right tennis elbow 05/09/2017  . Right tennis elbow 04/07/2017  . Prolapsed internal hemorrhoids, grade 3 06/03/2016  . Hx of adenomatous colonic polyps 05/27/2016    Past Surgical History:  Procedure Laterality Date  . COLONOSCOPY    . ELBOW SURGERY    . HEMORRHOID BANDING  2018  . HERNIA REPAIR Right   . INGUINAL HERNIA REPAIR Left 06/14/2017   Procedure: LEFT INGUINAL HERNIA REPAIR ERAS PATHWAY;  Surgeon: Erroll Luna, MD;  Location: [place];  Service: General;  Laterality: Left;  . INSERTION OF MESH Left 06/14/2017   Procedure: INSERTION OF MESH;  Surgeon: Erroll Luna, MD;  Location: [place];  Service: General;  Laterality: Left;  . MENISCUS REPAIR  09/2015  . TONSILLECTOMY        Allergies Oxycodone  Family History  Problem Relation Age of Onset  .  Heart disease Mother   . Alzheimer's disease Father   . Colon cancer Neg Hx   . Pancreatic cancer Neg Hx   . Prostate cancer Neg Hx   . Rectal cancer Neg Hx     Social History Social History   Tobacco Use  . Smoking status: Never Smoker  . Smokeless tobacco: Never Used  Substance Use Topics  . Alcohol use: Yes    Comment: rare  . Drug use: No    Review of Systems  Constitutional: No fever/chills Eyes: No visual changes. ENT: No sore throat. Cardiovascular: Positive chest pain. Respiratory: Denies shortness of breath. Gastrointestinal: No abdominal pain.  No nausea, no vomiting.  No diarrhea.  No constipation. Genitourinary: Negative for dysuria. Musculoskeletal: Negative for back pain. Skin: Negative for rash. Neurological: Negative for headaches, focal weakness or numbness.  10-point ROS otherwise negative.  ____________________________________________   PHYSICAL EXAM:  VITAL SIGNS: ED Triage Vitals  Enc Vitals Group     BP 08/07/17 0942 (!) 158/91     Pulse Rate 08/07/17 0942 72     Resp 08/07/17 0942 16     Temp 08/07/17 0942 98.2 F (36.8 C)     Temp Source 08/07/17 0942 Oral     SpO2 08/07/17 0942 100 %     Weight 08/07/17 0942 188 lb (85.3 kg)     Height 08/07/17 0942 6\' 2"  (1.88 m)     Pain  Score 08/07/17 0945 0   Constitutional: Alert and oriented. Well appearing and in no acute distress. Eyes: Conjunctivae are normal.  Head: Atraumatic. Nose: No congestion/rhinnorhea. Mouth/Throat: Mucous membranes are moist. Neck: No stridor.   Cardiovascular: Normal rate, regular rhythm. Good peripheral circulation. Grossly normal heart sounds.   Respiratory: Normal respiratory effort.  No retractions. Lungs CTAB. Gastrointestinal: Soft and nontender. No distention.  Musculoskeletal: No lower extremity tenderness nor edema. No gross deformities of extremities. Neurologic:  Normal speech and language. No gross focal neurologic deficits are appreciated.  Skin:   Skin is warm, dry and intact. No rash noted.  ____________________________________________   LABS (all labs ordered are listed, but only abnormal results are displayed)  Labs Reviewed  BASIC METABOLIC PANEL - Abnormal; Notable for the following components:      Result Value   Glucose, Bld 100 (*)    All other components within normal limits  CBC - Abnormal; Notable for the following components:   WBC 3.6 (*)    All other components within normal limits  TROPONIN I  TROPONIN I  TROPONIN I  LIPID PANEL  COMPREHENSIVE METABOLIC PANEL  CBC  I-STAT TROPONIN, ED   ____________________________________________  EKG   EKG Interpretation  Date/Time:  Sunday August 07 2017 09:46:44 EDT Ventricular Rate:  44 PR Interval:  158 QRS Duration: 134 QT Interval:  492 QTC Calculation: 420 R Axis:   -19 Text Interpretation:  Marked sinus bradycardia with sinus arrhythmia Right bundle branch block Abnormal ECG No STEMI.  Confirmed by Nanda Quinton [phone]) on 08/07/2017 11:42:39 AM       ____________________________________________  RADIOLOGY  Dg Chest 2 View  Result Date: 08/07/2017 CLINICAL DATA:  Chest pain and sweating this morning. EXAM: CHEST - 2 VIEW COMPARISON:  None. FINDINGS: The heart size and mediastinal contours are within normal limits. Both lungs are clear. The visualized skeletal structures are unremarkable. IMPRESSION: No active cardiopulmonary disease. No evidence of pneumonia or pulmonary edema. Electronically Signed   By: Franki Cabot M.D.   On: 08/07/2017 10:18    ____________________________________________   PROCEDURES  Procedure(s) performed:   Procedures  None ____________________________________________   INITIAL IMPRESSION / ASSESSMENT AND PLAN / ED COURSE  Pertinent labs & imaging results that were available during my care of the patient were reviewed by me and considered in my medical decision making (see chart for details).  Patient presents  to the emergency department for evaluation of chest pain.  He has a concerning chest pain story with no active pain at this time.  Patient also with significant bradycardia which seems to have been an issue for him in the past.  Troponin negative.  Plan for observation admission for chest pain evaluation. CXR negative.   Discussed patient's case with Hospitalist, Dr. Maudie Mercury to request admission. Patient and family (if present) updated with plan. Care transferred to Hospitalist service.  I reviewed all nursing notes, vitals, pertinent old records, EKGs, labs, imaging (as available).  Spoke with Cardiology. They ask that patient be trended overnight and call consult in the AM for recommendations regarding stress testing.     ____________________________________________  FINAL CLINICAL IMPRESSION(S) / ED DIAGNOSES  Final diagnoses:  Precordial chest pain     MEDICATIONS GIVEN DURING THIS VISIT:  Medications  aspirin EC tablet 324 mg (has no administration in time range)  dorzolamide (TRUSOPT) 2 % ophthalmic solution 1 drop (has no administration in time range)  latanoprost (XALATAN) 0.005 % ophthalmic solution 1 drop (has no administration  in time range)  nitroGLYCERIN (NITROGLYN) 2 % ointment 0.5 inch (has no administration in time range)  atorvastatin (LIPITOR) tablet 80 mg (80 mg Oral Given 08/07/17 1703)  enoxaparin (LOVENOX) injection 40 mg (40 mg Subcutaneous Given 08/07/17 1703)  sodium chloride flush (NS) 0.9 % injection 3 mL (has no administration in time range)  sodium chloride flush (NS) 0.9 % injection 3 mL (has no administration in time range)  0.9 %  sodium chloride infusion (has no administration in time range)  acetaminophen (TYLENOL) tablet 650 mg (has no administration in time range)    Or  acetaminophen (TYLENOL) suppository 650 mg (has no administration in time range)    Note:  This document was prepared using Dragon voice recognition software and may include  unintentional dictation errors.  Nanda Quinton, MD Emergency Medicine   Gladyce Mcray, [facility], MD 08/07/17 [phone]

## 2017-08-07 NOTE — ED Triage Notes (Signed)
Pt. Stated, I woke up this morning around 700 and had chest pain and sweating really bad. The symptoms lasted hour half. No pain at present.

## 2017-08-07 NOTE — H&P (Signed)
TRH H&P   Patient Demographics:    Andrew Payne, is a 72 y.o. male  MRN: [id number]   DOB - 1946/04/20  Admit Date - 08/07/2017  Outpatient Primary MD for the patient is Marton Redwood, MD  Referring MD/NP/PA:   Antonieta Loveless  Outpatient Specialists:   Patient coming from:  home  Chief Complaint  Patient presents with  . Chest Pain      HPI:    Andrew Payne  is a 72 y.o. male, w hx of bradycardia, PVC apparently c/o chest pain "dull" left sided, with radiation to the left arm,  Starting at rest at 7:30 am while lying in bed.  Lasting about 45 minutes.  Slight heartburn, .  Pt denies fever, chills, palp, sob, n/v, diarrhea, brbpr.  Pt is currently chest pain free.    In Ed,  CXR IMPRESSION: No active cardiopulmonary disease. No evidence of pneumonia or pulmonary edema.  EKG nsr at 44, borderline LAD, no st-t changes c/w ischemia   Na 138, K 4.2, Bun 14, Creatinine 1.18 Wbc 3.6, Hgb 15.4, Plt 185 Trop 0.00  ED consulted cardiology Pt will be admitted for w/up of chest pain.             Review of systems:    In addition to the HPI above, No Fever-chills, No Headache, No changes with Vision or hearing, No problems swallowing food or Liquids, No  Cough or Shortness of Breath, No Abdominal pain, No Nausea or Vommitting, Bowel movements are regular, No Blood in stool or Urine, No dysuria, No new skin rashes or bruises, No new joints pains-aches,  No new weakness, tingling, numbness in any extremity, No recent weight gain or loss, No polyuria, polydypsia or polyphagia, No significant Mental Stressors.  A full 10 point Review of Systems was done, except as stated above, all other Review of Systems were negative.   With Past History of the following :    Past Medical History:  Diagnosis Date  . Arthritis    wrist, elbow, thumb, knee  . Bradycardia     . Glaucoma   . Hx of adenomatous colonic polyps 05/27/2016  . Inguinal hernia    left  . Internal hemorrhoids   . PVC's (premature ventricular contractions)       Past Surgical History:  Procedure Laterality Date  . COLONOSCOPY    . ELBOW SURGERY    . HEMORRHOID BANDING  2018  . HERNIA REPAIR Right   . INGUINAL HERNIA REPAIR Left 06/14/2017   Procedure: LEFT INGUINAL HERNIA REPAIR ERAS PATHWAY;  Surgeon: Erroll Luna, MD;  Location: [place];  Service: General;  Laterality: Left;  . INSERTION OF MESH Left 06/14/2017   Procedure: INSERTION OF MESH;  Surgeon: Erroll Luna, MD;  Location: [place];  Service: General;  Laterality: Left;  . MENISCUS REPAIR  09/2015  .  TONSILLECTOMY        Social History:     Social History   Tobacco Use  . Smoking status: Never Smoker  . Smokeless tobacco: Never Used  Substance Use Topics  . Alcohol use: Yes    Comment: rare     Lives - at home  Mobility - walks by self   Family History :     Family History  Problem Relation Age of Onset  . Heart disease Mother   . Alzheimer's disease Father   . Colon cancer Neg Hx   . Pancreatic cancer Neg Hx   . Prostate cancer Neg Hx   . Rectal cancer Neg Hx      Home Medications:   Prior to Admission medications   Medication Sig Start Date End Date Taking? Authorizing Provider  Cholecalciferol (VITAMIN D PO) Take by mouth. 2 capsules daily    [provider]  dorzolamide (TRUSOPT) 2 % ophthalmic solution Place 1 drop into both eyes daily.    [provider]  HYDROcodone-acetaminophen (NORCO/VICODIN) 5-325 MG tablet Take 1-2 tablets by mouth every 6 (six) hours as needed for moderate pain. Patient not taking: Reported on 06/30/2017 06/14/17   Erroll Luna, MD  ibuprofen (ADVIL,MOTRIN) 800 MG tablet Take 1 tablet (800 mg total) by mouth every 8 (eight) hours as needed. 06/14/17   Cornett, Marcello Moores, MD  latanoprost (XALATAN) 0.005 %  ophthalmic solution Place 1 drop into both eyes at bedtime.    [provider]  Omega-3 Fatty Acids (FISH OIL) 1000 MG CAPS Take 1 capsule by mouth 2 (two) times daily.    [provider]  ondansetron (ZOFRAN) 4 MG tablet Take 1 tablet (4 mg total) by mouth daily as needed for nausea or vomiting. Patient not taking: Reported on 06/30/2017 06/14/17 06/14/18  Erroll Luna, MD     Allergies:     Allergies  Allergen Reactions  . Oxycodone Nausea And Vomiting     Physical Exam:   Vitals  Blood pressure (!) 145/84, pulse (!) 38, temperature 98.2 F (36.8 C), temperature source Oral, resp. rate 13, height 6\' 2"  (1.88 m), weight 85.3 kg (188 lb), SpO2 100 %.   1. General lying in bed in NAD,   2. Normal affect and insight, Not Suicidal or Homicidal, Awake Alert, Oriented X 3.  3. No F.N deficits, ALL C.Nerves Intact, Strength 5/5 all 4 extremities, Sensation intact all 4 extremities, Plantars down going.  4. Ears and Eyes appear Normal, Conjunctivae clear, PERRLA. Moist Oral Mucosa.  5. Supple Neck, No JVD, No cervical lymphadenopathy appriciated, No Carotid Bruits.  6. Symmetrical Chest wall movement, Good air movement bilaterally, CTAB.  7. RRR, No Gallops, Rubs or Murmurs, No Parasternal Heave.  8. Positive Bowel Sounds, Abdomen Soft, No tenderness, No organomegaly appriciated,No rebound -guarding or rigidity.  9.  No Cyanosis, Normal Skin Turgor, No Skin Rash or Bruise.  10. Good muscle tone,  joints appear normal , no effusions, Normal ROM.  11. No Palpable Lymph Nodes in Neck or Axillae     Data Review:    CBC Recent Labs  Lab 08/07/17 0948  WBC 3.6*  HGB 15.4  HCT 44.7  PLT 185  MCV 98.2  MCH 33.8  MCHC 34.5  RDW 14.5   ------------------------------------------------------------------------------------------------------------------  Chemistries  Recent Labs  Lab 08/07/17 0948  NA 138  K 4.2  CL 104  CO2 25  GLUCOSE 100*  BUN 14    CREATININE 1.18  CALCIUM 9.2   ------------------------------------------------------------------------------------------------------------------  estimated creatinine clearance is 68.7 mL/min (by C-G formula based on SCr of 1.18 mg/dL). ------------------------------------------------------------------------------------------------------------------ No results for input(s): TSH, T4TOTAL, T3FREE, THYROIDAB in the last 72 hours.  Invalid input(s): FREET3  Coagulation profile No results for input(s): INR, PROTIME in the last 168 hours. ------------------------------------------------------------------------------------------------------------------- No results for input(s): DDIMER in the last 72 hours. -------------------------------------------------------------------------------------------------------------------  Cardiac Enzymes No results for input(s): CKMB, TROPONINI, MYOGLOBIN in the last 168 hours.  Invalid input(s): CK ------------------------------------------------------------------------------------------------------------------ No results found for: BNP   ---------------------------------------------------------------------------------------------------------------  Urinalysis No results found for: COLORURINE, APPEARANCEUR, LABSPEC, PHURINE, GLUCOSEU, HGBUR, BILIRUBINUR, KETONESUR, PROTEINUR, UROBILINOGEN, NITRITE, LEUKOCYTESUR  ----------------------------------------------------------------------------------------------------------------   Imaging Results:    Dg Chest 2 View  Result Date: 08/07/2017 CLINICAL DATA:  Chest pain and sweating this morning. EXAM: CHEST - 2 VIEW COMPARISON:  None. FINDINGS: The heart size and mediastinal contours are within normal limits. Both lungs are clear. The visualized skeletal structures are unremarkable. IMPRESSION: No active cardiopulmonary disease. No evidence of pneumonia or pulmonary edema. Electronically Signed   By: Franki Cabot M.D.   On: 08/07/2017 10:18      Assessment & Plan:    Principal Problem:   Chest pain Active Problems:   Bradycardia    Chest pain Tele Trop I q6h x3 Check cardiac echo Check lipid Start Aspirin 325mg  po qday Start Lipitor 80mg  po qhs No beta blocker due to bradycardia NTP 1/2 inch topically q8h Cardiology consult by ED, appreciate input  Bradycardia Check TSH     DVT Prophylaxis Lovenox - SCDs  AM Labs Ordered, also please review Full Orders  Family Communication: Admission, patients condition and plan of care including tests being ordered have been discussed with the patient  who indicate understanding and agree with the plan and Code Status.  Code Status FULL CODE  Likely DC to  home  Condition GUARDED   Consults called: cardiology by ED  Admission status: observation  Time spent in minutes :45   Jani Gravel M.D on 08/07/2017 at 12:49 PM  Between 7am to 7pm - Pager - [phone]  . After 7pm go to [email or web address] - password [email or web address]  [facility] - Office  [phone]

## 2017-08-07 NOTE — ED Notes (Signed)
Dr. Laverta Baltimore at bedside at this time.

## 2017-08-07 NOTE — ED Notes (Signed)
Dr. Maudie Mercury at bedside at this time.

## 2017-08-08 ENCOUNTER — Other Ambulatory Visit (HOSPITAL_COMMUNITY): Payer: PPO

## 2017-08-08 ENCOUNTER — Observation Stay (HOSPITAL_COMMUNITY): Payer: PPO

## 2017-08-08 DIAGNOSIS — R001 Bradycardia, unspecified: Secondary | ICD-10-CM

## 2017-08-08 DIAGNOSIS — R0789 Other chest pain: Secondary | ICD-10-CM | POA: Diagnosis not present

## 2017-08-08 DIAGNOSIS — R079 Chest pain, unspecified: Secondary | ICD-10-CM | POA: Diagnosis not present

## 2017-08-08 LAB — COMPREHENSIVE METABOLIC PANEL WITH GFR
ALT: 29 U/L (ref 17–63)
AST: 30 U/L (ref 15–41)
Albumin: 3.1 g/dL — ABNORMAL LOW (ref 3.5–5.0)
Alkaline Phosphatase: 47 U/L (ref 38–126)
Anion gap: 8 (ref 5–15)
BUN: 17 mg/dL (ref 6–20)
CO2: 23 mmol/L (ref 22–32)
Calcium: 8.8 mg/dL — ABNORMAL LOW (ref 8.9–10.3)
Chloride: 107 mmol/L (ref 101–111)
Creatinine, Ser: 1.21 mg/dL (ref 0.61–1.24)
GFR calc Af Amer: >60 mL/min
GFR calc non Af Amer: 59 mL/min — ABNORMAL LOW
Glucose, Bld: 94 mg/dL (ref 65–99)
Potassium: 4 mmol/L (ref 3.5–5.1)
Sodium: 138 mmol/L (ref 135–145)
Total Bilirubin: 1.1 mg/dL (ref 0.3–1.2)
Total Protein: 5.4 g/dL — ABNORMAL LOW (ref 6.5–8.1)

## 2017-08-08 LAB — CBC
HCT: 40 % (ref 39.0–52.0)
HEMOGLOBIN: 13.7 g/dL (ref 13.0–17.0)
MCH: 33.7 pg (ref 26.0–34.0)
MCHC: 34.3 g/dL (ref 30.0–36.0)
MCV: 98.5 fL (ref 78.0–100.0)
Platelets: 169 10*3/uL (ref 150–400)
RBC: 4.06 MIL/uL — AB (ref 4.22–5.81)
RDW: 14.5 % (ref 11.5–15.5)
WBC: 3.8 10*3/uL — ABNORMAL LOW (ref 4.0–10.5)

## 2017-08-08 LAB — LIPID PANEL
CHOL/HDL RATIO: 3.4 ratio
Cholesterol: 182 mg/dL (ref 0–200)
HDL: 54 mg/dL (ref >40)
LDL CALC: 116 mg/dL — AB (ref 0–99)
Triglycerides: 59 mg/dL (ref <150)
VLDL: 12 mg/dL (ref 0–40)

## 2017-08-08 LAB — TROPONIN I

## 2017-08-08 LAB — TSH: TSH: 3.223 u[IU]/mL (ref 0.350–4.500)

## 2017-08-08 MED ORDER — NITROGLYCERIN 0.4 MG SL SUBL
SUBLINGUAL_TABLET | SUBLINGUAL | Status: AC
  Filled 2017-08-08: qty 2

## 2017-08-08 MED ORDER — IOPAMIDOL (ISOVUE-370) INJECTION 76%: 100.0000 mL | Freq: Once | INTRAVENOUS | Status: DC | PRN

## 2017-08-08 MED ORDER — IOPAMIDOL (ISOVUE-370) INJECTION 76%
INTRAVENOUS | Status: AC
  Administered 2017-08-08: 100 mL
  Filled 2017-08-08: qty 100

## 2017-08-08 MED ORDER — NITROGLYCERIN 0.4 MG SL SUBL
0.8000 mg | SUBLINGUAL_TABLET | SUBLINGUAL | Status: DC | PRN
  Administered 2017-08-08: 0.8 mg via SUBLINGUAL

## 2017-08-08 MED ORDER — ATORVASTATIN CALCIUM 80 MG PO TABS: 80.0000 mg | ORAL_TABLET | Freq: Every day | ORAL | 0 refills | Status: DC

## 2017-08-08 NOTE — Consult Note (Addendum)
Cardiology Consultation:   Patient ID: Andrew Payne; [id number]; [birth year]   Admit date: 08/07/2017 Date of Consult: 08/08/2017  Primary Care Provider: Marton Redwood, MD Primary Cardiologist: New to [place]; remotely Dr. Harrington Challenger (2006) Primary Electrophysiologist:  None   Patient Profile:   Andrew Payne is a 72 y.o. male with a PMH of bradycardia and borderline HLD, who is being seen today for the evaluation of chest pain at the request of Dr. Eliseo Squires.  History of Present Illness:   Andrew Payne was in his usual state of health until yesterday morning when he experienced dull left-sided chest pain radiating to his L arm with associated diaphoresis while laying in bed. The pain persisted for ~45 minutes and resolved after taking 4 baby aspirins.   Patient has no significant cardiac history. He was evaluated by Dr. Caryl Comes in 2006 for bradycardia without intervention at that time. Echo showed normal LVEF and no significant valvular disease. Myocardial perfusion study was negative. His last cardiac evaluation included a Coronary CT scan with calcium score of 49 08/2015.   Hospital course: Hypertensive on arrival (158/91), improved to 110/69 this AM; bradycardic in the 30s-40s overnight; otherwise VSS. Labs notable for electrolytes wnl, Cr 1.21 (unknown baseline), Hgb 13.7, PLT 169, Trop negative x3, Tcholesterol 182, HDL 54, LDL 116. EKG with sinus bradycardia (rate 44) with sinus arrhythmia, no comparison. TSH pending. Patient was given IVF and started on atorvastatin 80mg  daily by the medicine team. Cardiology consulted for further evaluation. 2D echo pending.   Pt is currently resting comfortably in bed. He denies any recurrent CP since it resolved yesterday morning. He is completley asymptomatic with his bradycardia. He reports a long h/o bradycardia with baseline HR typically in the 40s. He denies syncope, exertional fatigue, dyspnea, dizziness. No exertional CP. He is not on any AV nodal  blocking agents.   Past Medical History:  Diagnosis Date  . Arthritis    wrist, elbow, thumb, knee  . Bradycardia   . Glaucoma   . Hx of adenomatous colonic polyps 05/27/2016  . Inguinal hernia    left  . Internal hemorrhoids   . PVC's (premature ventricular contractions)     Past Surgical History:  Procedure Laterality Date  . COLONOSCOPY    . ELBOW SURGERY    . HEMORRHOID BANDING  2018  . HERNIA REPAIR Right   . INGUINAL HERNIA REPAIR Left 06/14/2017   Procedure: LEFT INGUINAL HERNIA REPAIR ERAS PATHWAY;  Surgeon: Erroll Luna, MD;  Location: [facility];  Service: General;  Laterality: Left;  . INSERTION OF MESH Left 06/14/2017   Procedure: INSERTION OF MESH;  Surgeon: Erroll Luna, MD;  Location: [facility];  Service: General;  Laterality: Left;  . MENISCUS REPAIR  09/2015  . TONSILLECTOMY       Home Medications:  Prior to Admission medications   Medication Sig Start Date End Date Taking? Authorizing Provider  aspirin EC 81 MG tablet Take 324 mg by mouth once.   Yes [provider]  Cholecalciferol (VITAMIN D PO) Take by mouth. 2 capsules daily   Yes [provider]  dorzolamide (TRUSOPT) 2 % ophthalmic solution Place 1 drop into both eyes daily.   Yes [provider]  latanoprost (XALATAN) 0.005 % ophthalmic solution Place 1 drop into both eyes at bedtime.   Yes [provider]  Omega-3 Fatty Acids (FISH OIL) 1000 MG CAPS Take 1 capsule by mouth 2 (two) times daily.   Yes  [provider]  HYDROcodone-acetaminophen (NORCO/VICODIN) 5-325 MG tablet Take 1-2 tablets by mouth every 6 (six) hours as needed for moderate pain. Patient not taking: Reported on 06/30/2017 06/14/17   Erroll Luna, MD  ibuprofen (ADVIL,MOTRIN) 800 MG tablet Take 1 tablet (800 mg total) by mouth every 8 (eight) hours as needed. Patient not taking: Reported on 08/07/2017 06/14/17   Erroll Luna, MD  ondansetron (ZOFRAN) 4 MG  tablet Take 1 tablet (4 mg total) by mouth daily as needed for nausea or vomiting. Patient not taking: Reported on 06/30/2017 06/14/17 06/14/18  Erroll Luna, MD    Inpatient Medications: Scheduled Meds: . aspirin EC  324 mg Oral Once  . atorvastatin  80 mg Oral q1800  . dorzolamide  1 drop Both Eyes Daily  . enoxaparin (LOVENOX) injection  40 mg Subcutaneous Q24H  . latanoprost  1 drop Both Eyes QHS  . nitroGLYCERIN  0.5 inch Topical Q8H  . sodium chloride flush  3 mL Intravenous Q12H   Continuous Infusions: . sodium chloride     PRN Meds: sodium chloride, acetaminophen **OR** acetaminophen, sodium chloride flush  Allergies:    Allergies  Allergen Reactions  . Oxycodone Nausea And Vomiting    Social History:   Social History   Socioeconomic History  . Marital status: Married    Spouse name: Not on file  . Number of children: Not on file  . Years of education: Not on file  . Highest education level: Not on file  Occupational History  . Not on file  Social Needs  . Financial resource strain: Not on file  . Food insecurity:    Worry: Not on file    Inability: Not on file  . Transportation needs:    Medical: Not on file    Non-medical: Not on file  Tobacco Use  . Smoking status: Never Smoker  . Smokeless tobacco: Never Used  Substance and Sexual Activity  . Alcohol use: Yes    Comment: rare  . Drug use: No  . Sexual activity: Not on file  Lifestyle  . Physical activity:    Days per week: Not on file    Minutes per session: Not on file  . Stress: Not on file  Relationships  . Social connections:    Talks on phone: Not on file    Gets together: Not on file    Attends religious service: Not on file    Active member of club or organization: Not on file    Attends meetings of clubs or organizations: Not on file    Relationship status: Not on file  . Intimate partner violence:    Fear of current or ex partner: Not on file    Emotionally abused: Not on file     Physically abused: Not on file    Forced sexual activity: Not on file  Other Topics Concern  . Not on file  Social History Narrative  . Not on file    Family History:    Family History  Problem Relation Age of Onset  . Heart disease Mother   . Alzheimer's disease Father   . Colon cancer Neg Hx   . Pancreatic cancer Neg Hx   . Prostate cancer Neg Hx   . Rectal cancer Neg Hx      ROS:  Please see the history of present illness.   All other ROS reviewed and negative.     Physical Exam/Data:   Vitals:   08/07/17 1313 08/07/17 2038  08/08/17 0451 08/08/17 0455  BP: (!) 165/79 128/70  110/69  Pulse: (!) 41 (!) 41  (!) 41  Resp:  16  18  Temp: 97.8 F (36.6 C) 98.6 F (37 C)  98 F (36.7 C)  TempSrc: Oral Oral  Oral  SpO2: 100% 97%  95%  Weight:   187 lb 11.2 oz (85.1 kg)   Height:        Intake/Output Summary (Last 24 hours) at 08/08/2017 0714 Last data filed at 08/07/2017 2150 Gross per 24 hour  Intake 3 ml  Output -  Net 3 ml   Filed Weights   08/07/17 0942 08/08/17 0451  Weight: 188 lb (85.3 kg) 187 lb 11.2 oz (85.1 kg)   Body mass index is 24.1 kg/m.  General:  Well nourished, well developed, in no acute distress HEENT: sclera anicteric  Lymph: no adenopathy Neck: no JVD Endocrine:  No thryomegaly Vascular: No carotid bruits; distal pulses 2+ bilaterally Cardiac:  Bradycardia, sinus, no murmur  Lungs:  clear to auscultation bilaterally, no wheezing, rhonchi or rales  Abd: NABS, soft, nontender, no hepatomegaly Ext: no edema Musculoskeletal:  No deformities, BUE and BLE strength normal and equal Skin: warm and dry  Neuro:  CNs 2-12 intact, no focal abnormalities noted Psych:  Normal affect   EKG:  The EKG was personally reviewed and demonstrates:  Sinus bradycardia with sinus arrhythmia Telemetry:  Telemetry was personally reviewed and demonstrates:  Sinus bradycardia, upper 30s low 40s  Relevant CV Studies:  CT Cardiac Scoring 08/2015: Coronary  arteries: Small punctate areas of calcium in all 3 major epicardial coronary arteries  IMPRESSION: Coronary calcium score of 49. This was 40th percentile for age and sex matched control.   Laboratory Data:  Chemistry Recent Labs  Lab 08/07/17 0948 08/08/17 0032  NA 138 138  K 4.2 4.0  CL 104 107  CO2 25 23  GLUCOSE 100* 94  BUN 14 17  CREATININE 1.18 1.21  CALCIUM 9.2 8.8*  GFRNONAA >60 59*  GFRAA >60 >60  ANIONGAP 9 8    Recent Labs  Lab 08/08/17 0032  PROT 5.4*  ALBUMIN 3.1*  AST 30  ALT 29  ALKPHOS 47  BILITOT 1.1   Hematology Recent Labs  Lab 08/07/17 0948 08/08/17 0032  WBC 3.6* 3.8*  RBC 4.55 4.06*  HGB 15.4 13.7  HCT 44.7 40.0  MCV 98.2 98.5  MCH 33.8 33.7  MCHC 34.5 34.3  RDW 14.5 14.5  PLT 185 169   Cardiac Enzymes Recent Labs  Lab 08/07/17 1346 08/07/17 1903 08/08/17 0024  TROPONINI <0.03 <0.03 <0.03    Recent Labs  Lab 08/07/17 1017  TROPIPOC 0.00    BNPNo results for input(s): BNP, PROBNP in the last 168 hours.  DDimer No results for input(s): DDIMER in the last 168 hours.  Radiology/Studies:  Dg Chest 2 View  Result Date: 08/07/2017 CLINICAL DATA:  Chest pain and sweating this morning. EXAM: CHEST - 2 VIEW COMPARISON:  None. FINDINGS: The heart size and mediastinal contours are within normal limits. Both lungs are clear. The visualized skeletal structures are unremarkable. IMPRESSION: No active cardiopulmonary disease. No evidence of pneumonia or pulmonary edema. Electronically Signed   By: Franki Cabot M.D.   On: 08/07/2017 10:18    Assessment and Plan:   1. Chest pain: atypical pain which occurred at rest, lasting ~45 min. Resolved spontaneously. No recurrence overnight. CP free. Troponins are negative x 3. EKG shows sinus bradycardia. Calcium score in 2017 was  55 and CT showed small punctate areas of calcium in all 3 major epicardial coronary arteries. May consider additional imaging with coronary CTA with morphology for  further evaluation. He would be a good candidate for study given his baseline bradycardia.   2. Dyslipidemia: LDL 116. Calcium score in 2017 was 49. CT showed small punctate areas of calcium in all 3 major epicardial coronary arteries. Would benefit from risk factor modifications. - Agree with statin therapy. Target LDL < 70 mg/dL.   3. Bradycardia: longstanding for many years. HR in the 30s-40s , which he reports is his baseline. Completely asymptomatic w/o syncope/ near syncope or dizziness. No h/o exertional fatigue, dyspnea, or exertional CP. He is not on any AV nodal blocking agents. K WNL. TSH pending. He underwent evaluation in 2006 and was seen by Dr. Caryl Comes for bradycardia. NST and 2D echo were normal. No further w/u pursed at that time.    For questions or updates, please contact [place] Please consult [email or web address] for contact info under Cardiology/STEMI.   Signed, Abigail Butts, [name]  08/08/2017 7:14 AM [phone]  History and all data above reviewed.  Patient examined.  I agree with the findings as above. the patient is very well and usually very active.  He denies chest pain typically.  He developed chest pain as above.  He has had no objective evidence of ischemia.  However, his pain had typical and atypica features.  EKG with RBBB and bradycardia.  I cannot find a description of his EKG from 2006 and I cannot find Dr. Olin Pia complete note.  He has ectopy but no syncope.  The patient exam reveals COR:RRR  ,  Lungs: Clear  ,  Abd: Positive bowel sounds, no rebound no guarding, Ext No edema  .  All available labs, radiology testing, previous records reviewed. Agree with documented assessment and plan.  CHEST PAIN:  Typical greater than atypical features.   He has had coronary calcium noted previously.  I will assume that this was [place].  I will plan CTA.  Bradycardia:  No symptoms.  No change in therapy.  Call Andrew Payne with the results and send results to Marton Redwood, MD    Minus Breeding  9:53 AM  08/08/2017

## 2017-08-08 NOTE — Progress Notes (Signed)
Patient received discharge information and acknowledged understanding of it. Patient IV was removed.  

## 2017-08-08 NOTE — Discharge Summary (Signed)
Physician Discharge Summary  Andrew Payne PPI:[id number] DOB: 1945-06-27 DOA: 08/07/2017  PCP: Marton Redwood, MD  Admit date: 08/07/2017 Discharge date: 08/08/2017   Recommendations for Outpatient Follow-Up:   1. Risk factor modification   Discharge Diagnosis:   Principal Problem:   Chest pain Active Problems:   Bradycardia   Discharge disposition:  Home  Discharge Condition: Improved.  Diet recommendation: Low sodium, heart healthy  Wound care: None.   History of Present Illness:   Andrew Payne  is a 72 y.o. male, w hx of bradycardia, PVC apparently c/o chest pain "dull" left sided, with radiation to the left arm,  Starting at rest at 7:30 am while lying in bed.  Lasting about 45 minutes.  Slight heartburn, .  Pt denies fever, chills, palp, sob, n/v, diarrhea, brbpr.  Pt is currently chest pain free.      Hospital Course by Problem:   Chest pain -CE negative -CTA -- needs risk factor modification-- statin -cardiology follow up  Bradycardia TSH normal -has been worked up in the past by Dr. Caryl Comes      Medical Consultants:    cards   Discharge Exam:   Vitals:   08/08/17 0455 08/08/17 1500  BP: 110/69 106/71  Pulse: (!) 41 (!) 53  Resp: 18 17  Temp: 98 F (36.7 C) 98.1 F (36.7 C)  SpO2: 95% 100%   Vitals:   08/07/17 2038 08/08/17 0451 08/08/17 0455 08/08/17 1500  BP: 128/70  110/69 106/71  Pulse: (!) 41  (!) 41 (!) 53  Resp: 16  18 17   Temp: 98.6 F (37 C)  98 F (36.7 C) 98.1 F (36.7 C)  TempSrc: Oral  Oral Oral  SpO2: 97%  95% 100%  Weight:  85.1 kg (187 lb 11.2 oz)    Height:        Gen:  NAD    The results of significant diagnostics from this hospitalization (including imaging, microbiology, ancillary and laboratory) are listed below for reference.     Procedures and Diagnostic Studies:   Dg Chest 2 View  Result Date: 08/07/2017 CLINICAL DATA:  Chest pain and sweating this morning. EXAM: CHEST - 2 VIEW COMPARISON:   None. FINDINGS: The heart size and mediastinal contours are within normal limits. Both lungs are clear. The visualized skeletal structures are unremarkable. IMPRESSION: No active cardiopulmonary disease. No evidence of pneumonia or pulmonary edema. Electronically Signed   By: Franki Cabot M.D.   On: 08/07/2017 10:18   Ct Coronary Morph W/cta Cor W/score W/ca W/cm &/or Wo/cm  Addendum Date: 08/08/2017   ADDENDUM REPORT: 08/08/2017 17:44 CLINICAL DATA:  72 year old male with atypical chest pain. EXAM: Cardiac/Coronary  CT TECHNIQUE: The patient was scanned on a [employer]. FINDINGS: A 120 kV prospective scan was triggered in the descending thoracic aorta at 111 HU's. Axial non-contrast 3 mm slices were carried out through the heart. The data set was analyzed on a dedicated work station and scored using the Merlin. Gantry rotation speed was 250 msecs and collimation was .6 mm. No beta blockade and 0.8 mg of sl NTG was given. The 3D data set was reconstructed in 5% intervals of the 67-82 % of the R-R cycle. Diastolic phases were analyzed on a dedicated work station using MPR, MIP and VRT modes. The patient received 80 cc of contrast. Aorta:  Normal size.  No calcifications.  No dissection. Aortic Valve:  Trileaflet.  Mild calcifications. Coronary Arteries:  Normal coronary origin.  Right  dominance. RCA is a large dominant artery that gives rise to PDA and PLVB. There is mild mixed plaque in the proximal RCA. PDA and PLA arteries have small lumen. Left main is a large caliber artery that gives rise to LAD and LCX arteries. LM has no plaque. LAD is a large vessel that gives rise to one large diagonal artery. LAD has a mild diffuse plaque throughout the proximal and mid portion with maximum stenosis 25-50%. D1 has mild non-calcified plaque with stenosis 25-50%. LCX is a medium caliber non-dominant artery that gives rise to one large OM1 branch. There is minimal calcified plaque in the proximal the  proximal LCX artery associated with 0-25% stenosis. There is mild non-calcified plaque in the proximal LAD associated with 25-50% stenosis. Other findings: Normal pulmonary vein drainage into the left atrium. Normal let atrial appendage without a thrombus. Normal size of the pulmonary artery. IMPRESSION: 1. Coronary calcium score of 50. This was 50 percentile for age and sex matched control. 2. Normal coronary origin with right dominance. 3. Mild CAD in the proximal and mid LAD, first diagonal artery, OM1 and proximal RCA. Aggressive risk factor modification is recommended. Electronically Signed   By: Ena Dawley   On: 08/08/2017 17:44   Result Date: 08/08/2017 EXAM: OVER-READ INTERPRETATION  CT CHEST The following report is an over-read performed by radiologist Dr. Rolm Baptise of [facility], [place] on 08/08/2017. This over-read does not include interpretation of cardiac or coronary anatomy or pathology. The coronary CTA interpretation by the cardiologist is attached. COMPARISON:  09/28/2015 FINDINGS: Vascular: Heart is normal size.  Visualized aorta is normal caliber. Mediastinum/Nodes: No adenopathy in the lower mediastinum or hila. Lungs/Pleura: Minimal dependent atelectasis.  No effusions. Upper Abdomen: Imaging into the upper abdomen shows no acute findings. Musculoskeletal: Chest wall soft tissues are unremarkable. No acute bony abnormality. IMPRESSION: No acute or significant extracardiac abnormality. Electronically Signed: By: Rolm Baptise M.D. On: 08/08/2017 15:50     Labs:   Basic Metabolic Panel: Recent Labs  Lab 08/07/17 0948 08/08/17 0032  NA 138 138  K 4.2 4.0  CL 104 107  CO2 25 23  GLUCOSE 100* 94  BUN 14 17  CREATININE 1.18 1.21  CALCIUM 9.2 8.8*   GFR Estimated Creatinine Clearance: 67 mL/min (by C-G formula based on SCr of 1.21 mg/dL). Liver Function Tests: Recent Labs  Lab 08/08/17 0032  AST 30  ALT 29  ALKPHOS 47  BILITOT 1.1  PROT 5.4*  ALBUMIN 3.1*    No results for input(s): LIPASE, AMYLASE in the last 168 hours. No results for input(s): AMMONIA in the last 168 hours. Coagulation profile No results for input(s): INR, PROTIME in the last 168 hours.  CBC: Recent Labs  Lab 08/07/17 0948 08/08/17 0032  WBC 3.6* 3.8*  HGB 15.4 13.7  HCT 44.7 40.0  MCV 98.2 98.5  PLT 185 169   Cardiac Enzymes: Recent Labs  Lab 08/07/17 1346 08/07/17 1903 08/08/17 0024  TROPONINI <0.03 <0.03 <0.03   BNP: Invalid input(s): POCBNP CBG: No results for input(s): GLUCAP in the last 168 hours. D-Dimer No results for input(s): DDIMER in the last 72 hours. Hgb A1c No results for input(s): HGBA1C in the last 72 hours. Lipid Profile Recent Labs    08/08/17 0032  CHOL 182  HDL 54  LDLCALC 116*  TRIG 59  CHOLHDL 3.4   Thyroid function studies Recent Labs    08/08/17 0830  TSH 3.223   Anemia work up No results  for input(s): VITAMINB12, FOLATE, FERRITIN, TIBC, IRON, RETICCTPCT in the last 72 hours. Microbiology No results found for this or any previous visit (from the past 240 hour(s)).   Discharge Instructions:   Discharge Instructions    Diet - low sodium heart healthy   Complete by:  As directed    Increase activity slowly   Complete by:  As directed      Allergies as of 08/08/2017      Reactions   Oxycodone Nausea And Vomiting      Medication List    STOP taking these medications   HYDROcodone-acetaminophen 5-325 MG tablet Commonly known as:  NORCO/VICODIN   ibuprofen 800 MG tablet Commonly known as:  ADVIL,MOTRIN   ondansetron 4 MG tablet Commonly known as:  ZOFRAN     TAKE these medications   aspirin EC 81 MG tablet Take 324 mg by mouth once.   atorvastatin 80 MG tablet Commonly known as:  LIPITOR Take 1 tablet (80 mg total) by mouth daily at 6 PM.   dorzolamide 2 % ophthalmic solution Commonly known as:  TRUSOPT Place 1 drop into both eyes daily.   Fish Oil 1000 MG Caps Take 1 capsule by mouth 2  (two) times daily.   latanoprost 0.005 % ophthalmic solution Commonly known as:  XALATAN Place 1 drop into both eyes at bedtime.   VITAMIN D PO Take by mouth. 2 capsules daily         Time coordinating discharge: 25 min  Signed:  Geradine Girt   [facility] 08/08/2017, 6:34 PM

## 2017-08-08 NOTE — Care Management Note (Signed)
Case Management Note  Patient Details  Name: Andrew Payne MRN: [id number] Date of Birth: 1945-08-10  Subjective/Objective: Pt presented for Chest Pain hx of bradycardia. PTA Independent from home with support of wife. Plan will be to transition home once stable.                   Action/Plan: No home needs identified at this time.   Expected Discharge Date:                  Expected Discharge Plan:  Home/Self Care  In-House Referral:  NA  Discharge planning Services  CM Consult  Post Acute Care Choice:  NA Choice offered to:  NA  DME Arranged:  N/A DME Agency:  NA  HH Arranged:  NA HH Agency:  NA  Status of Service:  Completed, signed off  If discussed at [place] of Stay Meetings, dates discussed:    Additional Comments:  Bethena Roys, RN 08/08/2017, 11:44 AM

## 2017-08-08 NOTE — Care Management Obs Status (Signed)
[place] NOTIFICATION   Patient Details  Name: Andrew Payne MRN: [id number] Date of Birth: 1945/09/21   Medicare Observation Status Notification Given:  Yes    Bethena Roys, RN 08/08/2017, 11:32 AM

## 2017-08-08 NOTE — Progress Notes (Signed)
Pt has had episodic bigeminy PVC and persistent bradycardia.  Heart rate upper 30s to 40s while awake in bed, 60s with movement and as low as 30 while asleep.  BP WNL, pt denies any symptoms, Troponin (-) x 3, K 4.0.  [facility] notified via [place].  Will cont to monitor closely.

## 2017-08-08 NOTE — Progress Notes (Signed)
PROGRESS NOTE    Andrew Payne  UUV:[id number] DOB: Jun 13, 1945 DOA: 08/07/2017 PCP: Marton Redwood, MD   Outpatient Specialists:     Brief Narrative:  Andrew Payne  is a 72 y.o. male, w hx of bradycardia, PVC apparently c/o chest pain "dull" left sided, with radiation to the left arm,  Starting at rest at 7:30 am while lying in bed.  Lasting about 45 minutes.  Slight heartburn, .  Pt denies fever, chills, palp, sob, n/v, diarrhea, brbpr.  Pt is currently chest pain free.      Assessment & Plan:   Principal Problem:   Chest pain Active Problems:   Bradycardia   Chest pain -CE negative -CTA pending  Bradycardia TSH normal -has been worked up in the past by Dr. Caryl Comes     DVT prophylaxis:  Lovenox   Code Status: Full Code   Family Communication:   Disposition Plan:     Consultants:   cards   Subjective: No further chest discomfort, had work up for low HR in 2006  Objective: Vitals:   08/07/17 1313 08/07/17 2038 08/08/17 0451 08/08/17 0455  BP: (!) 165/79 128/70  110/69  Pulse: (!) 41 (!) 41  (!) 41  Resp:  16  18  Temp: 97.8 F (36.6 C) 98.6 F (37 C)  98 F (36.7 C)  TempSrc: Oral Oral  Oral  SpO2: 100% 97%  95%  Weight:   85.1 kg (187 lb 11.2 oz)   Height:        Intake/Output Summary (Last 24 hours) at 08/08/2017 1330 Last data filed at 08/08/2017 1008 Gross per 24 hour  Intake 123 ml  Output -  Net 123 ml   Filed Weights   08/07/17 0942 08/08/17 0451  Weight: 85.3 kg (188 lb) 85.1 kg (187 lb 11.2 oz)    Examination:  General exam: Appears calm and comfortable  Respiratory system: Clear to auscultation. Respiratory effort normal. Cardiovascular system: S1 & S2 heard, brady. No JVD, murmurs, rubs, gallops or clicks. No pedal edema. Gastrointestinal system: Abdomen is nondistended, soft and nontender. No organomegaly or masses felt. Normal bowel sounds heard. Central nervous system: Alert and oriented. No focal neurological  deficits. Extremities: Symmetric 5 x 5 power. Skin: No rashes, lesions or ulcers Psychiatry: Judgement and insight appear normal. Mood & affect appropriate.     Data Reviewed: I have personally reviewed following labs and imaging studies  CBC: Recent Labs  Lab 08/07/17 0948 08/08/17 0032  WBC 3.6* 3.8*  HGB 15.4 13.7  HCT 44.7 40.0  MCV 98.2 98.5  PLT 185 474   Basic Metabolic Panel: Recent Labs  Lab 08/07/17 0948 08/08/17 0032  NA 138 138  K 4.2 4.0  CL 104 107  CO2 25 23  GLUCOSE 100* 94  BUN 14 17  CREATININE 1.18 1.21  CALCIUM 9.2 8.8*   GFR: Estimated Creatinine Clearance: 67 mL/min (by C-G formula based on SCr of 1.21 mg/dL). Liver Function Tests: Recent Labs  Lab 08/08/17 0032  AST 30  ALT 29  ALKPHOS 47  BILITOT 1.1  PROT 5.4*  ALBUMIN 3.1*   No results for input(s): LIPASE, AMYLASE in the last 168 hours. No results for input(s): AMMONIA in the last 168 hours. Coagulation Profile: No results for input(s): INR, PROTIME in the last 168 hours. Cardiac Enzymes: Recent Labs  Lab 08/07/17 1346 08/07/17 1903 08/08/17 0024  TROPONINI <0.03 <0.03 <0.03   BNP (last 3 results) No results for input(s): PROBNP in the  last 8760 hours. HbA1C: No results for input(s): HGBA1C in the last 72 hours. CBG: No results for input(s): GLUCAP in the last 168 hours. Lipid Profile: Recent Labs    08/08/17 0032  CHOL 182  HDL 54  LDLCALC 116*  TRIG 59  CHOLHDL 3.4   Thyroid Function Tests: Recent Labs    08/08/17 0830  TSH 3.223   Anemia Panel: No results for input(s): VITAMINB12, FOLATE, FERRITIN, TIBC, IRON, RETICCTPCT in the last 72 hours. Urine analysis: No results found for: COLORURINE, APPEARANCEUR, LABSPEC, [place], GLUCOSEU, HGBUR, BILIRUBINUR, KETONESUR, PROTEINUR, UROBILINOGEN, NITRITE, LEUKOCYTESUR   )No results found for this or any previous visit (from the past 240 hour(s)).    Anti-infectives (From admission, onward)   None        Radiology Studies: Dg Chest 2 View  Result Date: 08/07/2017 CLINICAL DATA:  Chest pain and sweating this morning. EXAM: CHEST - 2 VIEW COMPARISON:  None. FINDINGS: The heart size and mediastinal contours are within normal limits. Both lungs are clear. The visualized skeletal structures are unremarkable. IMPRESSION: No active cardiopulmonary disease. No evidence of pneumonia or pulmonary edema. Electronically Signed   By: Franki Cabot M.D.   On: 08/07/2017 10:18        Scheduled Meds: . aspirin EC  324 mg Oral Once  . atorvastatin  80 mg Oral q1800  . dorzolamide  1 drop Both Eyes Daily  . enoxaparin (LOVENOX) injection  40 mg Subcutaneous Q24H  . latanoprost  1 drop Both Eyes QHS  . sodium chloride flush  3 mL Intravenous Q12H   Continuous Infusions: . sodium chloride       LOS: 0 days    Time spent: 25 min    Geradine Girt, DO [facility] Pager [phone]  If 7PM-7AM, please contact night-coverage [email or web address] Password [email or web address] 08/08/2017, 1:30 PM

## 2017-08-15 ENCOUNTER — Encounter (INDEPENDENT_AMBULATORY_CARE_PROVIDER_SITE_OTHER): Payer: Self-pay | Admitting: Orthopaedic Surgery

## 2017-08-15 ENCOUNTER — Ambulatory Visit (INDEPENDENT_AMBULATORY_CARE_PROVIDER_SITE_OTHER): Payer: PPO | Admitting: Orthopaedic Surgery

## 2017-08-15 VITALS — BP 119/76 | HR 48 | Resp 18 | Ht 74.0 in | Wt 185.0 lb

## 2017-08-15 DIAGNOSIS — M25512 Pain in left shoulder: Secondary | ICD-10-CM

## 2017-08-15 DIAGNOSIS — M25561 Pain in right knee: Secondary | ICD-10-CM | POA: Diagnosis not present

## 2017-08-15 DIAGNOSIS — G8929 Other chronic pain: Secondary | ICD-10-CM | POA: Diagnosis not present

## 2017-08-15 MED ORDER — METHYLPREDNISOLONE ACETATE 40 MG/ML IJ SUSP
80.0000 mg | INTRAMUSCULAR | Status: AC | PRN
  Administered 2017-08-15: 80 mg

## 2017-08-15 MED ORDER — BUPIVACAINE HCL 0.5 % IJ SOLN
2.0000 mL | INTRAMUSCULAR | Status: AC | PRN
Start: 2017-08-15 — End: 2017-08-15
  Administered 2017-08-15: 2 mL via INTRA_ARTICULAR

## 2017-08-15 MED ORDER — BUPIVACAINE HCL 0.5 % IJ SOLN
2.0000 mL | INTRAMUSCULAR | Status: AC | PRN
  Administered 2017-08-15: 2 mL via INTRA_ARTICULAR

## 2017-08-15 MED ORDER — LIDOCAINE HCL 2 % IJ SOLN
2.0000 mL | INTRAMUSCULAR | Status: AC | PRN
  Administered 2017-08-15: 2 mL

## 2017-08-15 MED ORDER — LIDOCAINE HCL 1 % IJ SOLN
2.0000 mL | INTRAMUSCULAR | Status: AC | PRN
  Administered 2017-08-15: 2 mL

## 2017-08-15 NOTE — Progress Notes (Signed)
Office Visit Note   Patient: Andrew Payne           Date of Birth: [birth year]           MRN: [id number] Visit Date: 08/15/2017              Requested by: Marton Redwood, MD [address] [place], [place] [address] PCP: Marton Redwood, MD   Assessment & Plan: Visit Diagnoses:  1. Chronic left shoulder pain   2. Chronic pain of right knee     Plan: Osteoarthritis right knee.  Cortisone injection and pre-certify Visco supplementation.  Impingement syndrome left shoulder.  Subacromial cortisone injection and monitor response  Follow-Up Instructions: Return in about 2 weeks (around 08/29/2017).   Orders:  Orders Placed This Encounter  Procedures  . Large Joint Inj: R knee  . Large Joint Inj: L subacromial bursa   No orders of the defined types were placed in this encounter.     Procedures: Large Joint Inj: R knee on 08/15/2017 3:04 PM Indications: pain and diagnostic evaluation Details: 25 G 1.5 in needle, anteromedial approach  Arthrogram: No  Medications: 2 mL bupivacaine 0.5 %; 2 mL lidocaine 1 %; 80 mg methylPREDNISolone acetate 40 MG/ML Procedure, treatment alternatives, risks and benefits explained, specific risks discussed. Consent was given by the patient. Immediately prior to procedure a time out was called to verify the correct patient, procedure, equipment, support staff and site/side marked as required. Patient was prepped and draped in the usual sterile fashion.   Large Joint Inj: L subacromial bursa on 08/15/2017 3:05 PM Indications: pain and diagnostic evaluation Details: 25 G 1.5 in needle, anterolateral approach  Arthrogram: No  Medications: 2 mL lidocaine 2 %; 2 mL bupivacaine 0.5 %; 80 mg methylPREDNISolone acetate 40 MG/ML Consent was given by the patient. Immediately prior to procedure a time out was called to verify the correct patient, procedure, equipment, support staff and site/side marked as required. Patient was prepped and draped in the usual  sterile fashion.       Clinical Data: No additional findings.   Subjective: Chief Complaint  Patient presents with  . Follow-up    l shoulder pain 2-3 weeks getting worse no injury, r knee f/u visco injection sept. 2017, issues are coming back  Has history of osteoarthritis right knee with prior knee arthroscopy.  X-rays and arthroscopy findings confirmed arthritis.  Completed a course of Visco supplementation over a year and a half ago with slow recurrence of his pain.  Wishes to restart Visco supplementation.  Recent atraumatic onset left shoulder pain.  Pain with overhead activity and lying on his left side.  Pain localized to the anterior and lateral subacromial region without referred discomfort, numbness or tingling  HPI  Review of Systems  Constitutional: Negative for fatigue.  HENT: Negative for ear pain.   Eyes: Negative for pain.  Respiratory: Negative for cough and shortness of breath.   Cardiovascular: Negative for leg swelling.  Gastrointestinal: Negative for constipation and diarrhea.  Genitourinary: Negative for difficulty urinating.  Musculoskeletal: Positive for neck pain. Negative for back pain.  Skin: Negative for rash.  Allergic/Immunologic: Negative for food allergies.  Neurological: Negative for weakness and numbness.  Hematological: Does not bruise/bleed easily.  Psychiatric/Behavioral: Negative for sleep disturbance.     Objective: Vital Signs: BP 119/76 (BP Location: Left Arm, Patient Position: Sitting, Cuff Size: Normal)   Pulse (!) 48   Resp 18   Ht 6\' 2"  (1.88 m)   Wt  185 lb (83.9 kg)   BMI 23.75 kg/m   Physical Exam  Constitutional: He is oriented to person, place, and time. He appears well-developed and well-nourished.  HENT:  Head: Atraumatic.  Eyes: Pupils are equal, round, and reactive to light. EOM are normal.  Pulmonary/Chest: Effort normal.  Neurological: He is alert and oriented to person, place, and time.  Skin: Skin is warm  and dry.    Ortho Exam awake alert and oriented x3.  Comfortable sitting.  Mild medial joint pain right knee with small effusion.  Nonpainful small Baker's cyst.  No calf pain.  No distal edema.  Full extension over 110 degrees of flexion. Left shoulder with positive impingement with anterior and lateral subacromial pain.  Biceps intact.  Good strength.  No discomfort of the acromioclavicular joint.  Pain with abduction lateral subacromial region.  Good grip and release. Over 3 months status post right tennis elbow release and doing quite well  Specialty Comments:  No specialty comments available.  Imaging: No results found.   PMFS History: Patient Active Problem List   Diagnosis Date Noted  . Chest pain 08/07/2017  . Bradycardia 08/07/2017  . History of right tennis elbow 05/09/2017  . Right tennis elbow 04/07/2017  . Prolapsed internal hemorrhoids, grade 3 06/03/2016  . Hx of adenomatous colonic polyps 05/27/2016   Past Medical History:  Diagnosis Date  . Arthritis    wrist, elbow, thumb, knee  . Bradycardia   . Glaucoma   . Hx of adenomatous colonic polyps 05/27/2016  . Inguinal hernia    left  . Internal hemorrhoids   . PVC's (premature ventricular contractions)     Family History  Problem Relation Age of Onset  . Heart disease Mother   . Alzheimer's disease Father   . Colon cancer Neg Hx   . Pancreatic cancer Neg Hx   . Prostate cancer Neg Hx   . Rectal cancer Neg Hx     Past Surgical History:  Procedure Laterality Date  . COLONOSCOPY    . ELBOW SURGERY    . HEMORRHOID BANDING  2018  . HERNIA REPAIR Right   . INGUINAL HERNIA REPAIR Left 06/14/2017   Procedure: LEFT INGUINAL HERNIA REPAIR ERAS PATHWAY;  Surgeon: Erroll Luna, MD;  Location: [facility];  Service: General;  Laterality: Left;  . INSERTION OF MESH Left 06/14/2017   Procedure: INSERTION OF MESH;  Surgeon: Erroll Luna, MD;  Location: [facility];  Service: General;   Laterality: Left;  . MENISCUS REPAIR  09/2015  . TONSILLECTOMY     Social History   Occupational History  . Not on file  Tobacco Use  . Smoking status: Never Smoker  . Smokeless tobacco: Never Used  Substance and Sexual Activity  . Alcohol use: Yes    Comment: rare  . Drug use: No  . Sexual activity: Not on file

## 2017-08-16 ENCOUNTER — Telehealth: Payer: Self-pay | Admitting: Rheumatology

## 2017-08-16 NOTE — Telephone Encounter (Signed)
Pharmacy calling to verify rx for Euflexxa. Please call to inform.

## 2017-08-18 ENCOUNTER — Telehealth (INDEPENDENT_AMBULATORY_CARE_PROVIDER_SITE_OTHER): Payer: Self-pay | Admitting: Orthopaedic Surgery

## 2017-08-18 NOTE — Telephone Encounter (Signed)
Andrew Payne took care of 08/17/17

## 2017-08-18 NOTE — Telephone Encounter (Signed)
[facility] left a voicemail stating that the Euflexxa is excluded under patient's Medicare.  They will be discontinuing the prescription at the pharmacy at this time.  If you have any questions, please call [phone]

## 2017-08-24 DIAGNOSIS — E7849 Other hyperlipidemia: Secondary | ICD-10-CM | POA: Diagnosis not present

## 2017-08-24 DIAGNOSIS — Z125 Encounter for screening for malignant neoplasm of prostate: Secondary | ICD-10-CM | POA: Diagnosis not present

## 2017-08-24 DIAGNOSIS — R82998 Other abnormal findings in urine: Secondary | ICD-10-CM | POA: Diagnosis not present

## 2017-08-26 DIAGNOSIS — Z1212 Encounter for screening for malignant neoplasm of rectum: Secondary | ICD-10-CM | POA: Diagnosis not present

## 2017-08-31 DIAGNOSIS — K649 Unspecified hemorrhoids: Secondary | ICD-10-CM | POA: Diagnosis not present

## 2017-08-31 DIAGNOSIS — I6529 Occlusion and stenosis of unspecified carotid artery: Secondary | ICD-10-CM | POA: Diagnosis not present

## 2017-08-31 DIAGNOSIS — Z6823 Body mass index (BMI) 23.0-23.9, adult: Secondary | ICD-10-CM | POA: Diagnosis not present

## 2017-08-31 DIAGNOSIS — I2510 Atherosclerotic heart disease of native coronary artery without angina pectoris: Secondary | ICD-10-CM | POA: Diagnosis not present

## 2017-08-31 DIAGNOSIS — Z23 Encounter for immunization: Secondary | ICD-10-CM | POA: Diagnosis not present

## 2017-08-31 DIAGNOSIS — E7849 Other hyperlipidemia: Secondary | ICD-10-CM | POA: Diagnosis not present

## 2017-08-31 DIAGNOSIS — Z0000 Encounter for general adult medical examination without abnormal findings: Secondary | ICD-10-CM | POA: Diagnosis not present

## 2017-08-31 DIAGNOSIS — R008 Other abnormalities of heart beat: Secondary | ICD-10-CM | POA: Diagnosis not present

## 2017-08-31 DIAGNOSIS — Z1389 Encounter for screening for other disorder: Secondary | ICD-10-CM | POA: Diagnosis not present

## 2017-09-02 ENCOUNTER — Telehealth (INDEPENDENT_AMBULATORY_CARE_PROVIDER_SITE_OTHER): Payer: Self-pay | Admitting: Orthopaedic Surgery

## 2017-09-02 NOTE — Telephone Encounter (Signed)
Patient called stating that he was checking to see if the Euflexxa injections have been approved so he can schedule an appointment.  Patient is requesting a return call.

## 2017-09-23 ENCOUNTER — Ambulatory Visit (INDEPENDENT_AMBULATORY_CARE_PROVIDER_SITE_OTHER): Payer: PPO | Admitting: Internal Medicine

## 2017-09-23 ENCOUNTER — Encounter: Payer: Self-pay | Admitting: Internal Medicine

## 2017-09-23 DIAGNOSIS — K642 Third degree hemorrhoids: Secondary | ICD-10-CM | POA: Diagnosis not present

## 2017-09-23 NOTE — Patient Instructions (Signed)
  HEMORRHOID BANDING PROCEDURE    FOLLOW-UP CARE   1. The procedure you have had should have been relatively painless since the banding of the area involved does not have nerve endings and there is no pain sensation.  The rubber band cuts off the blood supply to the hemorrhoid and the band may fall off as soon as 48 hours after the banding (the band may occasionally be seen in the toilet bowl following a bowel movement). You may notice a temporary feeling of fullness in the rectum which should respond adequately to plain Tylenol or Motrin.  2. Following the banding, avoid strenuous exercise that evening and resume full activity the next day.  A sitz bath (soaking in a warm tub) or bidet is soothing, and can be useful for cleansing the area after bowel movements.     3. To avoid constipation, take two tablespoons of natural wheat bran, natural oat bran, flax, Benefiber or any over the counter fiber supplement and increase your water intake to 7-8 glasses daily.    4. Unless you have been prescribed anorectal medication, do not put anything inside your rectum for two weeks: No suppositories, enemas, fingers, etc.  5. Occasionally, you may have more bleeding than usual after the banding procedure.  This is often from the untreated hemorrhoids rather than the treated one.  Don't be concerned if there is a tablespoon or so of blood.  If there is more blood than this, lie flat with your bottom higher than your head and apply an ice pack to the area. If the bleeding does not stop within a half an hour or if you feel faint, call our office at [phone] or go to the emergency room.  6. Problems are not common; however, if there is a substantial amount of bleeding, severe pain, chills, fever or difficulty passing urine (very rare) or other problems, you should call us at [phone] or report to the nearest emergency room.  7. Do not stay seated continuously for more than 2-3 hours for a day or  two after the procedure.  Tighten your buttock muscles 10-15 times every two hours and take 10-15 deep breaths every 1-2 hours.  Do not spend more than a few minutes on the toilet if you cannot empty your bowel; instead re-visit the toilet at a later time.    Please message [place] back in a month with how your doing.    I appreciate the opportunity to care for you. Silvano Rusk, MD, [facility]

## 2017-09-23 NOTE — Progress Notes (Signed)
Andrew Payne 72 y.o. [birth year] [id number]  Assessment & Plan:   Prolapsed internal hemorrhoids, grade 3 Banded LL, he will update me by my chart.  Hopefully this will solve his problems.  Consider banding of the other columns pending his response over the next month.  Surgery is an option as well that we are both trying to avoid.  I appreciate the opportunity to care for this patient. CC: Marton Redwood, MD    Subjective:   Chief Complaint: Recurrent hemorrhoids  HPI The patient has undergone hemorrhoidal banding in February 2018 in May 2018.  Left lateral grade 3 prolapsed each time.  After the May banding he had some pain issues.  He had 2 or 3 months of relief and then prolapse and difficulty cleansing has developed again without bleeding problems. Allergies  Allergen Reactions  . Oxycodone Nausea And Vomiting   Current Meds  Medication Sig  . aspirin EC 81 MG tablet Take 324 mg by mouth once.  . Cholecalciferol (VITAMIN D3) 1000 units CAPS Take 1 capsule by mouth daily.  . dorzolamide (TRUSOPT) 2 % ophthalmic solution Place 1 drop into both eyes daily.  Marland Kitchen latanoprost (XALATAN) 0.005 % ophthalmic solution Place 1 drop into both eyes at bedtime.  . Omega-3 Fatty Acids (FISH OIL) 1000 MG CAPS Take 1 capsule by mouth 2 (two) times daily.   Past Medical History:  Diagnosis Date  . Arthritis    wrist, elbow, thumb, knee  . Bradycardia   . Glaucoma   . Hx of adenomatous colonic polyps 05/27/2016  . Inguinal hernia    left  . Internal hemorrhoids   . PVC's (premature ventricular contractions)    Past Surgical History:  Procedure Laterality Date  . COLONOSCOPY    . ELBOW SURGERY Right   . HEMORRHOID BANDING  2018  . HERNIA REPAIR Right   . INGUINAL HERNIA REPAIR Left 06/14/2017   Procedure: LEFT INGUINAL HERNIA REPAIR ERAS PATHWAY;  Surgeon: Erroll Luna, MD;  Location: [facility];  Service: General;  Laterality: Left;  . INSERTION OF MESH Left  06/14/2017   Procedure: INSERTION OF MESH;  Surgeon: Erroll Luna, MD;  Location: [facility];  Service: General;  Laterality: Left;  . MENISCUS REPAIR Right 09/2015  . TONSILLECTOMY      Review of Systems As above  Objective:   Physical Exam BP (!) 102/50 (BP Location: Left Arm, Patient Position: Sitting, Cuff Size: Normal)   Pulse (!) 48   Ht 6' 0.56" (1.843 m)   Wt 183 lb 8 oz (83.2 kg)   BMI 24.50 kg/m    Rectal exam reveals a grade 3 prolapsed internal hemorrhoid visible, I cannot tell which column it is based upon the appearance it is reduced.  Digital exam is nontender.  Anoscopic exam shows a left posterior lateral hemorrhoid column that is enlarged and I think that the grade 3 hemorrhoid with inflammation also.  The right sided hemorrhoid columns appear to be grade 1-2  PROCEDURE NOTE: The patient presents with symptomatic grade 3 and I referred to it incorrectly patient GI resection and they clicked on it wrong hemorrhoids, requesting rubber band ligation of his/her hemorrhoidal disease.  All risks, benefits and alternative forms of therapy were described and informed consent was obtained.   The anorectum was pre-medicated with 0.125% NTG and 5% lidocaine The decision was made to band the LL internal hemorrhoid, and the [place] was used to perform band ligation without complication.  Digital anorectal examination was then performed to assure proper positioning of the band, and to adjust the banded tissue as required.  The patient was discharged home without pain or other issues.  Dietary and behavioral recommendations were given and along with follow-up instructions.      The patient will message me or call me regarding follow-up and possible additional banding as required. No complications were encountered and the patient tolerated the procedure well.   I appreciate the opportunity to care for this patient. CC: Marton Redwood, MD

## 2017-09-23 NOTE — Assessment & Plan Note (Addendum)
Banded LL, he will update me by my chart.  Hopefully this will solve his problems.  Consider banding of the other columns pending his response over the next month.  Surgery is an option as well that we are both trying to avoid.

## 2017-09-26 ENCOUNTER — Telehealth: Payer: Self-pay | Admitting: Rheumatology

## 2017-09-26 NOTE — Telephone Encounter (Signed)
I never received a message to apply for gel injection for patient.  Check with Andrew Payne for this patient.  I started submitting for gel injections in May 2019.

## 2017-09-26 NOTE — Telephone Encounter (Signed)
Patient called stating he was checking on the status of his Euflexxa injections.

## 2017-09-27 ENCOUNTER — Telehealth (INDEPENDENT_AMBULATORY_CARE_PROVIDER_SITE_OTHER): Payer: Self-pay

## 2017-09-27 NOTE — Telephone Encounter (Signed)
Submitted application online for Orthovisc injection series, right knee.

## 2017-09-27 NOTE — Telephone Encounter (Signed)
Request sent to April Jackson to apply, right knee only for Dr. Durward Fortes.

## 2017-10-03 ENCOUNTER — Encounter: Payer: Self-pay | Admitting: Internal Medicine

## 2017-10-03 ENCOUNTER — Ambulatory Visit (INDEPENDENT_AMBULATORY_CARE_PROVIDER_SITE_OTHER): Payer: PPO | Admitting: Internal Medicine

## 2017-10-03 DIAGNOSIS — K642 Third degree hemorrhoids: Secondary | ICD-10-CM

## 2017-10-03 NOTE — Progress Notes (Signed)
  HEMORRHOID LIGATION  SXS: Prolapse grade 3, status post banding last week with relapse    RECTAL: There is a grade 3 left lateral/posterior prolapsed internal hemorrhoid   ANOSCOPY: Same finding as rectal exam   PROCEDURE NOTE: The patient presents with symptomatic grade 3 hemorrhoids, requesting rubber band ligation of his/her hemorrhoidal disease.  All risks, benefits and alternative forms of therapy were described and informed consent was obtained.   The anorectum was pre-medicated with 0.125% NTG and 5% Lidocaine topical The decision was made to band the LL I placed bands internal hemorrhoid, and the [place] was used to perform band ligation without complication.   On this left lateral posterior area and more to the right of this.  The prior band had been placed more in the traditional left lateral position.  It was gone. Digital anorectal examination was then performed to assure proper positioning of the band, and to adjust the banded tissue as required.  The patient was discharged home without pain or other issues.  Dietary and behavioral recommendations were given and along with follow-up instructions.      The patient will return as needed for  follow-up and possible additional banding as required. No complications were encountered and the patient tolerated the procedure well.   I appreciate the opportunity to care for this patient.  He was not charged for this visit as it was within the timeframe of global care.  CC: Marton Redwood, MD

## 2017-10-03 NOTE — Patient Instructions (Addendum)
Hope this time works!  HEMORRHOID BANDING PROCEDURE    FOLLOW-UP CARE   1. The procedure you have had should have been relatively painless since the banding of the area involved does not have nerve endings and there is no pain sensation.  The rubber band cuts off the blood supply to the hemorrhoid and the band may fall off as soon as 48 hours after the banding (the band may occasionally be seen in the toilet bowl following a bowel movement). You may notice a temporary feeling of fullness in the rectum which should respond adequately to plain Tylenol or Motrin.  2. Following the banding, avoid strenuous exercise that evening and resume full activity the next day.  A sitz bath (soaking in a warm tub) or bidet is soothing, and can be useful for cleansing the area after bowel movements.     3. To avoid constipation, take two tablespoons of natural wheat bran, natural oat bran, flax, Benefiber or any over the counter fiber supplement and increase your water intake to 7-8 glasses daily.    4. Unless you have been prescribed anorectal medication, do not put anything inside your rectum for two weeks: No suppositories, enemas, fingers, etc.  5. Occasionally, you may have more bleeding than usual after the banding procedure.  This is often from the untreated hemorrhoids rather than the treated one.  Don't be concerned if there is a tablespoon or so of blood.  If there is more blood than this, lie flat with your bottom higher than your head and apply an ice pack to the area. If the bleeding does not stop within a half an hour or if you feel faint, call our office at [phone] or go to the emergency room.  6. Problems are not common; however, if there is a substantial amount of bleeding, severe pain, chills, fever or difficulty passing urine (very rare) or other problems, you should call us at [phone] or report to the nearest emergency room.  7. Do not stay seated continuously for  more than 2-3 hours for a day or two after the procedure.  Tighten your buttock muscles 10-15 times every two hours and take 10-15 deep breaths every 1-2 hours.  Do not spend more than a few minutes on the toilet if you cannot empty your bowel; instead re-visit the toilet at a later time.    Long Term Prevention of Recurrent Hemorrhoids:   1. Fiber - Western diets are typically deficient in dietary fiber, and the addition of 15 - 20 gm. of fiber will help you have stools of a proper consistency, limiting your need to strain.  In addition to the use of raw oat or wheat bran, there are a number of commercial preparations that are available (Metamucil, Benefiber and Citrucel are just a few).    2. Fluids - It is important to have a sufficient amount of water intake during the day, in part to help the fiber "do its job".  Unless you have a medical condition that would prohibit it, a minimum of 6 - 8 glasses per day is important to help keep a regular bowel movement.  3. Do not strain - Many experts feel that chronic straining is one of the causes for the development of hemorrhoids.  Trying to limit yourself to two minutes on the commode may well limit your risk of recurrent hemorrhoids.  Also, do not try to "hold it" or avoid going to the bathroom when the  urge is there.  These behavioral changes are thought to be very helpful in maintaining good bowel health.

## 2017-10-03 NOTE — Assessment & Plan Note (Signed)
I tried to band on and to the right of this left lateral/posterior grade 3 prolapsed internal hemorrhoid.  Hopefully this will hold.  It is a recalcitrant grade 3 hemorrhoid and it can be difficult to eradicate these with banding.  He will let me know how it goes.

## 2017-10-04 ENCOUNTER — Telehealth (INDEPENDENT_AMBULATORY_CARE_PROVIDER_SITE_OTHER): Payer: Self-pay

## 2017-10-04 NOTE — Telephone Encounter (Signed)
Patient called concerning status of gel injection for right knee.  Advised patient that application was submitted on portal on 09/27/17 and currently we are waiting for VOB.  Advised patient that I would send an e-mail to check the status. E-mail sent.

## 2017-10-14 ENCOUNTER — Encounter (INDEPENDENT_AMBULATORY_CARE_PROVIDER_SITE_OTHER): Payer: Self-pay | Admitting: Orthopaedic Surgery

## 2017-10-26 ENCOUNTER — Telehealth (INDEPENDENT_AMBULATORY_CARE_PROVIDER_SITE_OTHER): Payer: Self-pay

## 2017-10-26 NOTE — Telephone Encounter (Signed)
Patient is approved for Orthovisc series, right knee.(3) Andrew Payne Patient will be responsible for 20% OOP. $20.00 Co-pay No PA required.  Please schedule appointment with Dr. Durward Fortes.  Thank you.

## 2017-11-01 ENCOUNTER — Ambulatory Visit (INDEPENDENT_AMBULATORY_CARE_PROVIDER_SITE_OTHER): Payer: PPO | Admitting: Orthopaedic Surgery

## 2017-11-01 ENCOUNTER — Encounter (INDEPENDENT_AMBULATORY_CARE_PROVIDER_SITE_OTHER): Payer: Self-pay | Admitting: Orthopaedic Surgery

## 2017-11-01 VITALS — BP 111/65 | HR 52 | Ht 74.0 in | Wt 184.0 lb

## 2017-11-01 DIAGNOSIS — M1711 Unilateral primary osteoarthritis, right knee: Secondary | ICD-10-CM | POA: Diagnosis not present

## 2017-11-01 MED ORDER — HYALURONAN 30 MG/2ML IX SOSY
30.0000 mg | PREFILLED_SYRINGE | INTRA_ARTICULAR | Status: AC | PRN
  Administered 2017-11-01: 30 mg via INTRA_ARTICULAR

## 2017-11-01 NOTE — Progress Notes (Signed)
Office Visit Note   Patient: Andrew Payne           Date of Birth: 1945-07-15           MRN: [id number] Visit Date: 11/01/2017              Requested by: Marton Redwood, MD [address] [place], [place] [address] PCP: Marton Redwood, MD   Assessment & Plan: Visit Diagnoses:  1. Unilateral primary osteoarthritis, right knee     Plan: Initiate Orthovisc injection right knee.  Return weekly for the next 2 weeks to complete the series  Follow-Up Instructions: Return in about 1 week (around 11/08/2017).   Orders:  Orders Placed This Encounter  Procedures  . Large Joint Inj: R knee   No orders of the defined types were placed in this encounter.     Procedures: Large Joint Inj: R knee on 11/01/2017 4:05 PM Indications: pain and joint swelling Details: 25 G 1.5 in needle  Arthrogram: No  Medications: 30 mg Hyaluronan 30 MG/2ML Outcome: tolerated well, no immediate complications Procedure, treatment alternatives, risks and benefits explained, specific risks discussed. Consent was given by the patient. Immediately prior to procedure a time out was called to verify the correct patient, procedure, equipment, support staff and site/side marked as required. Patient was prepped and draped in the usual sterile fashion.       Clinical Data: No additional findings.   Subjective: Chief Complaint  Patient presents with  . Follow-up    R KNEE INJECTION # 1 ORTHOVISC    HPI  Review of Systems  Constitutional: Negative for fatigue and fever.  HENT: Negative for ear pain.   Eyes: Negative for pain.  Respiratory: Negative for cough and shortness of breath.   Cardiovascular: Negative for leg swelling.  Gastrointestinal: Negative for constipation and diarrhea.  Genitourinary: Negative for difficulty urinating.  Musculoskeletal: Negative for back pain and neck pain.  Skin: Negative for rash.  Allergic/Immunologic: Negative for food allergies.  Neurological: Negative for  weakness and numbness.  Hematological: Does not bruise/bleed easily.  Psychiatric/Behavioral: Negative for sleep disturbance.     Objective: Vital Signs: BP 111/65 (BP Location: Left Arm, Patient Position: Sitting, Cuff Size: Normal)   Pulse (!) 52   Ht 6\' 2"  (1.88 m)   Wt 184 lb (83.5 kg)   BMI 23.62 kg/m   Physical Exam  Ortho Exam right knee was not hot red or swollen.  No effusion.  Predominantly medial joint pain that was mild.  No patellar crepitation.  No instability Specialty Comments:  No specialty comments available.  Imaging: No results found.   PMFS History: Patient Active Problem List   Diagnosis Date Noted  . Unilateral primary osteoarthritis, right knee 11/01/2017  . Chest pain 08/07/2017  . Bradycardia 08/07/2017  . History of right tennis elbow 05/09/2017  . Right tennis elbow 04/07/2017  . Prolapsed internal hemorrhoids, grade 3 06/03/2016  . Hx of adenomatous colonic polyps 05/27/2016   Past Medical History:  Diagnosis Date  . Arthritis    wrist, elbow, thumb, knee  . Bradycardia   . Glaucoma   . Hx of adenomatous colonic polyps 05/27/2016  . Inguinal hernia    left  . Internal hemorrhoids   . PVC's (premature ventricular contractions)     Family History  Problem Relation Age of Onset  . Heart disease Mother   . Alzheimer's disease Father   . Colon cancer Neg Hx   . Pancreatic cancer Neg Hx   .  Prostate cancer Neg Hx   . Rectal cancer Neg Hx     Past Surgical History:  Procedure Laterality Date  . COLONOSCOPY    . ELBOW SURGERY Right   . HEMORRHOID BANDING  2018  . HERNIA REPAIR Right   . INGUINAL HERNIA REPAIR Left 06/14/2017   Procedure: LEFT INGUINAL HERNIA REPAIR ERAS PATHWAY;  Surgeon: Erroll Luna, MD;  Location: [facility];  Service: General;  Laterality: Left;  . INSERTION OF MESH Left 06/14/2017   Procedure: INSERTION OF MESH;  Surgeon: Erroll Luna, MD;  Location: [facility];  Service:  General;  Laterality: Left;  . MENISCUS REPAIR Right 09/2015  . TONSILLECTOMY     Social History   Occupational History  . Not on file  Tobacco Use  . Smoking status: Never Smoker  . Smokeless tobacco: Never Used  Substance and Sexual Activity  . Alcohol use: Yes    Comment: rare  . Drug use: No  . Sexual activity: Not on file

## 2017-11-04 DIAGNOSIS — M792 Neuralgia and neuritis, unspecified: Secondary | ICD-10-CM | POA: Diagnosis not present

## 2017-11-04 DIAGNOSIS — Z7689 Persons encountering health services in other specified circumstances: Secondary | ICD-10-CM | POA: Diagnosis not present

## 2017-11-07 ENCOUNTER — Ambulatory Visit (INDEPENDENT_AMBULATORY_CARE_PROVIDER_SITE_OTHER): Payer: PPO | Admitting: Orthopaedic Surgery

## 2017-11-07 ENCOUNTER — Encounter (INDEPENDENT_AMBULATORY_CARE_PROVIDER_SITE_OTHER): Payer: Self-pay | Admitting: Orthopaedic Surgery

## 2017-11-07 DIAGNOSIS — M1711 Unilateral primary osteoarthritis, right knee: Secondary | ICD-10-CM | POA: Diagnosis not present

## 2017-11-07 MED ORDER — HYALURONAN 30 MG/2ML IX SOSY
30.0000 mg | PREFILLED_SYRINGE | INTRA_ARTICULAR | Status: AC | PRN
  Administered 2017-11-07: 30 mg via INTRA_ARTICULAR

## 2017-11-07 NOTE — Progress Notes (Signed)
Office Visit Note   Patient: Andrew Payne           Date of Birth: 05/12/1945           MRN: [id number] Visit Date: 11/07/2017              Requested by: Marton Redwood, MD [address] [place],  [address] PCP: Marton Redwood, MD   Assessment & Plan: Visit Diagnoses:  1. Unilateral primary osteoarthritis, right knee     Plan: second orthovisc inj today  Follow-Up Instructions: Return in about 1 week (around 11/14/2017).   Orders:  No orders of the defined types were placed in this encounter.  No orders of the defined types were placed in this encounter.     Procedures: Large Joint Inj on 11/07/2017 8:01 AM Details: 25 G 1.5 in needle, anteromedial approach Medications: 30 mg Hyaluronan 30 MG/2ML Outcome: tolerated well, no immediate complications      Clinical Data: No additional findings.   Subjective: No chief complaint on file. No problems related to first Orthovisc injection  HPI  Review of Systems   Objective: Vital Signs: There were no vitals taken for this visit.  Physical Exam  Ortho Exam right knee was not hot red warm or swollen.  No effusion  Specialty Comments:  No specialty comments available.  Imaging: No results found.   PMFS History: Patient Active Problem List   Diagnosis Date Noted  . Unilateral primary osteoarthritis, right knee 11/01/2017  . Chest pain 08/07/2017  . Bradycardia 08/07/2017  . History of right tennis elbow 05/09/2017  . Right tennis elbow 04/07/2017  . Prolapsed internal hemorrhoids, grade 3 06/03/2016  . Hx of adenomatous colonic polyps 05/27/2016   Past Medical History:  Diagnosis Date  . Arthritis    wrist, elbow, thumb, knee  . Bradycardia   . Glaucoma   . Hx of adenomatous colonic polyps 05/27/2016  . Inguinal hernia    left  . Internal hemorrhoids   . PVC's (premature ventricular contractions)     Family History  Problem Relation Age of Onset  . Heart disease Mother   . Alzheimer's  disease Father   . Colon cancer Neg Hx   . Pancreatic cancer Neg Hx   . Prostate cancer Neg Hx   . Rectal cancer Neg Hx     Past Surgical History:  Procedure Laterality Date  . COLONOSCOPY    . ELBOW SURGERY Right   . HEMORRHOID BANDING  2018  . HERNIA REPAIR Right   . INGUINAL HERNIA REPAIR Left 06/14/2017   Procedure: LEFT INGUINAL HERNIA REPAIR ERAS PATHWAY;  Surgeon: Erroll Luna, MD;  Location: [facility];  Service: General;  Laterality: Left;  . INSERTION OF MESH Left 06/14/2017   Procedure: INSERTION OF MESH;  Surgeon: Erroll Luna, MD;  Location: [facility];  Service: General;  Laterality: Left;  . MENISCUS REPAIR Right 09/2015  . TONSILLECTOMY     Social History   Occupational History  . Not on file  Tobacco Use  . Smoking status: Never Smoker  . Smokeless tobacco: Never Used  Substance and Sexual Activity  . Alcohol use: Yes    Comment: rare  . Drug use: No  . Sexual activity: Not on file     Garald Balding, MD   Note - This record has been created using [employer].  Chart creation errors have been sought, but may not always  have been located. Such creation errors  do not reflect on  the standard of medical care.

## 2017-11-14 ENCOUNTER — Ambulatory Visit (INDEPENDENT_AMBULATORY_CARE_PROVIDER_SITE_OTHER): Payer: PPO | Admitting: Orthopaedic Surgery

## 2017-11-14 ENCOUNTER — Encounter (INDEPENDENT_AMBULATORY_CARE_PROVIDER_SITE_OTHER): Payer: Self-pay | Admitting: Orthopaedic Surgery

## 2017-11-14 VITALS — BP 123/75 | HR 56 | Ht 74.0 in | Wt 183.0 lb

## 2017-11-14 DIAGNOSIS — M1711 Unilateral primary osteoarthritis, right knee: Secondary | ICD-10-CM

## 2017-11-14 MED ORDER — HYALURONAN 30 MG/2ML IX SOSY
30.0000 mg | PREFILLED_SYRINGE | INTRA_ARTICULAR | Status: AC | PRN
  Administered 2017-11-14: 30 mg via INTRA_ARTICULAR

## 2017-11-14 NOTE — Progress Notes (Signed)
Office Visit Note   Patient: Andrew Payne           Date of Birth: [birth year]           MRN: [id number] Visit Date: 11/14/2017              Requested by: Marton Redwood, MD [address] [place], [place] [address] PCP: Marton Redwood, MD   Assessment & Plan: Visit Diagnoses:  1. Unilateral primary osteoarthritis, right knee     Plan: Third Orthovisc injection right knee.  Follow-up as needed  Follow-Up Instructions: Return if symptoms worsen or fail to improve.   Orders:  Orders Placed This Encounter  Procedures  . Large Joint Inj   No orders of the defined types were placed in this encounter.     Procedures: Large Joint Inj: R knee on 11/14/2017 4:25 PM Indications: pain and joint swelling Details: 25 G 1.5 in needle  Arthrogram: No  Medications: 30 mg Hyaluronan 30 MG/2ML Outcome: tolerated well, no immediate complications Procedure, treatment alternatives, risks and benefits explained, specific risks discussed. Consent was given by the patient. Immediately prior to procedure a time out was called to verify the correct patient, procedure, equipment, support staff and site/side marked as required. Patient was prepped and draped in the usual sterile fashion.       Clinical Data: No additional findings.   Subjective: No chief complaint on file. No significant improvement in right knee pain after the first 2 Orthovisc injections.  However, no complications  HPI  Review of Systems   Objective: Vital Signs: BP 123/75 (BP Location: Left Arm, Patient Position: Sitting, Cuff Size: Normal)   Pulse (!) 56   Ht 6\' 2"  (1.88 m)   Wt 183 lb (83 kg)   BMI 23.50 kg/m   Physical Exam  Ortho Exam right knee without effusion.  Walks without a limp.  No instability.  Predominately medial joint discomfort Specialty Comments:  No specialty comments available.  Imaging: No results found.   PMFS History: Patient Active Problem List   Diagnosis Date Noted  .  Unilateral primary osteoarthritis, right knee 11/01/2017  . Chest pain 08/07/2017  . Bradycardia 08/07/2017  . History of right tennis elbow 05/09/2017  . Right tennis elbow 04/07/2017  . Prolapsed internal hemorrhoids, grade 3 06/03/2016  . Hx of adenomatous colonic polyps 05/27/2016   Past Medical History:  Diagnosis Date  . Arthritis    wrist, elbow, thumb, knee  . Bradycardia   . Glaucoma   . Hx of adenomatous colonic polyps 05/27/2016  . Inguinal hernia    left  . Internal hemorrhoids   . PVC's (premature ventricular contractions)     Family History  Problem Relation Age of Onset  . Heart disease Mother   . Alzheimer's disease Father   . Colon cancer Neg Hx   . Pancreatic cancer Neg Hx   . Prostate cancer Neg Hx   . Rectal cancer Neg Hx     Past Surgical History:  Procedure Laterality Date  . COLONOSCOPY    . ELBOW SURGERY Right   . HEMORRHOID BANDING  2018  . HERNIA REPAIR Right   . INGUINAL HERNIA REPAIR Left 06/14/2017   Procedure: LEFT INGUINAL HERNIA REPAIR ERAS PATHWAY;  Surgeon: Erroll Luna, MD;  Location: [facility];  Service: General;  Laterality: Left;  . INSERTION OF MESH Left 06/14/2017   Procedure: INSERTION OF MESH;  Surgeon: Erroll Luna, MD;  Location: [facility];  Service: General;  Laterality: Left;  . MENISCUS REPAIR Right 09/2015  . TONSILLECTOMY     Social History   Occupational History  . Not on file  Tobacco Use  . Smoking status: Never Smoker  . Smokeless tobacco: Never Used  Substance and Sexual Activity  . Alcohol use: Yes    Comment: rare  . Drug use: No  . Sexual activity: Not on file     Garald Balding, MD   Note - This record has been created using [employer].  Chart creation errors have been sought, but may not always  have been located. Such creation errors do not reflect on  the standard of medical care.

## 2017-11-25 ENCOUNTER — Encounter (INDEPENDENT_AMBULATORY_CARE_PROVIDER_SITE_OTHER): Payer: Self-pay | Admitting: Orthopaedic Surgery

## 2017-11-25 ENCOUNTER — Encounter: Payer: Self-pay | Admitting: Internal Medicine

## 2017-11-29 ENCOUNTER — Telehealth: Payer: Self-pay

## 2017-11-29 NOTE — Telephone Encounter (Signed)
-----   Message from Gatha Mayer, MD sent at 11/28/2017  6:24 PM EDT ----- Regarding: banding appt He needs a banding appointment again Next week or later this month  if no openings you can make one when we do not have 18 or more patients already

## 2017-11-29 NOTE — Telephone Encounter (Signed)
Spoke with Andrew Payne and we have put him on the books for 12/30/2017 at 9:15AM. He is traveling before then so not going to work out sooner.

## 2017-12-06 NOTE — Telephone Encounter (Signed)
OK TO DO

## 2017-12-09 ENCOUNTER — Encounter (INDEPENDENT_AMBULATORY_CARE_PROVIDER_SITE_OTHER): Payer: Self-pay | Admitting: Orthopaedic Surgery

## 2017-12-23 ENCOUNTER — Encounter (INDEPENDENT_AMBULATORY_CARE_PROVIDER_SITE_OTHER): Payer: Self-pay | Admitting: Orthopaedic Surgery

## 2017-12-29 DIAGNOSIS — H401111 Primary open-angle glaucoma, right eye, mild stage: Secondary | ICD-10-CM | POA: Diagnosis not present

## 2017-12-30 ENCOUNTER — Encounter: Payer: Self-pay | Admitting: Internal Medicine

## 2017-12-30 ENCOUNTER — Ambulatory Visit (INDEPENDENT_AMBULATORY_CARE_PROVIDER_SITE_OTHER): Payer: PPO | Admitting: Internal Medicine

## 2017-12-30 DIAGNOSIS — K642 Third degree hemorrhoids: Secondary | ICD-10-CM

## 2017-12-30 DIAGNOSIS — K641 Second degree hemorrhoids: Secondary | ICD-10-CM | POA: Diagnosis not present

## 2017-12-30 NOTE — Progress Notes (Signed)
   HEMORRHOID LIGATION  SXS:  Prolapse mainly, some bleeding, and cleansing problems  Prior bandings 2/18 x 2, 5/18, 5/19, 6/19 LL banded x 4 - He thinks better and wants to pursue additional banding Surgical referral has been offered and declined  Anoscopy:   Grade 2 LL internal hemorrhoid w/ posterior component  PROCEDURE NOTE: The patient presents with symptomatic grade 2-3  hemorrhoids, requesting rubber band ligation of his/her hemorrhoidal disease.  All risks, benefits and alternative forms of therapy were described and informed consent was obtained.   The anorectum was pre-medicated with 0.125% NTG and 5% lidocaine The decision was made to band the LL/posterior internal hemorrhoid, and the [place] was used to perform band ligation without complication. 3 bands placed - 2 with limited uptake of tissue and third with typical tissue uptake. Digital anorectal examination was then performed to assure proper positioning of the band, and to adjust the banded tissue as required.  The patient was discharged home without pain or other issues.  Dietary and behavioral recommendations were given and along with follow-up instructions.     The following adjunctive treatments were recommended: Benefiber The patient will return prn for  follow-up and possible additional banding as required. No complications were encountered and the patient tolerated the procedure well.   I appreciate the opportunity to care for this patient. TG:YBWL, Gwyndolyn Saxon, MD

## 2017-12-30 NOTE — Patient Instructions (Addendum)
HEMORRHOID BANDING PROCEDURE    FOLLOW-UP CARE   1. The procedure you have had should have been relatively painless since the banding of the area involved does not have nerve endings and there is no pain sensation.  The rubber band cuts off the blood supply to the hemorrhoid and the band may fall off as soon as 48 hours after the banding (the band may occasionally be seen in the toilet bowl following a bowel movement). You may notice a temporary feeling of fullness in the rectum which should respond adequately to plain Tylenol or Motrin.  2. Following the banding, avoid strenuous exercise that evening and resume full activity the next day.  A sitz bath (soaking in a warm tub) or bidet is soothing, and can be useful for cleansing the area after bowel movements.     3. To avoid constipation, take two tablespoons of natural wheat bran, natural oat bran, flax, Benefiber or any over the counter fiber supplement and increase your water intake to 7-8 glasses daily.    4. Unless you have been prescribed anorectal medication, do not put anything inside your rectum for two weeks: No suppositories, enemas, fingers, etc.  5. Occasionally, you may have more bleeding than usual after the banding procedure.  This is often from the untreated hemorrhoids rather than the treated one.  Don't be concerned if there is a tablespoon or so of blood.  If there is more blood than this, lie flat with your bottom higher than your head and apply an ice pack to the area. If the bleeding does not stop within a half an hour or if you feel faint, call our office at [phone] or go to the emergency room.  6. Problems are not common; however, if there is a substantial amount of bleeding, severe pain, chills, fever or difficulty passing urine (very rare) or other problems, you should call us at [phone] or report to the nearest emergency room.  7. Do not stay seated continuously for more than 2-3 hours for a day or two  after the procedure.  Tighten your buttock muscles 10-15 times every two hours and take 10-15 deep breaths every 1-2 hours.  Do not spend more than a few minutes on the toilet if you cannot empty your bowel; instead re-visit the toilet at a later time.   ______________________________________________________________________________  Andrew Payne.  Call us with an update.  .If you are age 70 or older, your body mass index should be between 23-30. Your Body mass index is 23.91 kg/m. If this is out of the aforementioned range listed, please consider follow up with your Primary Care Provider.  I appreciate the opportunity to care for you. Silvano Rusk, MD, [facility]

## 2018-01-01 NOTE — Assessment & Plan Note (Addendum)
LL/posterior banded with 3 bands  RTC prn

## 2018-01-03 ENCOUNTER — Other Ambulatory Visit (INDEPENDENT_AMBULATORY_CARE_PROVIDER_SITE_OTHER): Payer: Self-pay | Admitting: Radiology

## 2018-01-03 DIAGNOSIS — G8929 Other chronic pain: Secondary | ICD-10-CM

## 2018-01-03 DIAGNOSIS — M25561 Pain in right knee: Principal | ICD-10-CM

## 2018-01-15 ENCOUNTER — Ambulatory Visit
Admission: RE | Admit: 2018-01-15 | Discharge: 2018-01-15 | Disposition: A | Discharge status: Home / Self Care | Payer: PPO | Source: Ambulatory Visit | Attending: Orthopaedic Surgery | Admitting: Orthopaedic Surgery

## 2018-01-15 DIAGNOSIS — M25561 Pain in right knee: Secondary | ICD-10-CM | POA: Diagnosis not present

## 2018-01-15 DIAGNOSIS — G8929 Other chronic pain: Secondary | ICD-10-CM

## 2018-01-31 ENCOUNTER — Encounter (INDEPENDENT_AMBULATORY_CARE_PROVIDER_SITE_OTHER): Payer: Self-pay | Admitting: Orthopaedic Surgery

## 2018-02-22 DIAGNOSIS — Z23 Encounter for immunization: Secondary | ICD-10-CM | POA: Diagnosis not present

## 2018-02-27 ENCOUNTER — Ambulatory Visit (INDEPENDENT_AMBULATORY_CARE_PROVIDER_SITE_OTHER): Payer: PPO | Admitting: Orthopaedic Surgery

## 2018-02-27 ENCOUNTER — Encounter (INDEPENDENT_AMBULATORY_CARE_PROVIDER_SITE_OTHER): Payer: Self-pay | Admitting: Orthopaedic Surgery

## 2018-02-27 VITALS — BP 115/71 | HR 52 | Ht 74.0 in | Wt 184.0 lb

## 2018-02-27 DIAGNOSIS — M1711 Unilateral primary osteoarthritis, right knee: Secondary | ICD-10-CM | POA: Diagnosis not present

## 2018-02-27 DIAGNOSIS — G8929 Other chronic pain: Secondary | ICD-10-CM

## 2018-02-27 DIAGNOSIS — M25512 Pain in left shoulder: Secondary | ICD-10-CM

## 2018-02-27 MED ORDER — LIDOCAINE HCL 1 % IJ SOLN
2.0000 mL | INTRAMUSCULAR | Status: AC | PRN
  Administered 2018-02-27: 2 mL

## 2018-02-27 MED ORDER — METHYLPREDNISOLONE ACETATE 40 MG/ML IJ SUSP
80.0000 mg | INTRAMUSCULAR | Status: AC | PRN
  Administered 2018-02-27: 80 mg

## 2018-02-27 MED ORDER — BUPIVACAINE HCL 0.5 % IJ SOLN
2.0000 mL | INTRAMUSCULAR | Status: AC | PRN
  Administered 2018-02-27: 2 mL via INTRA_ARTICULAR

## 2018-02-27 MED ORDER — LIDOCAINE HCL 2 % IJ SOLN
2.0000 mL | INTRAMUSCULAR | Status: AC | PRN
  Administered 2018-02-27: 2 mL

## 2018-02-27 NOTE — Progress Notes (Signed)
Office Visit Note   Patient: Andrew Payne           Date of Birth: 1946/04/17           MRN: [id number] Visit Date: 02/27/2018              Requested by: Marton Redwood, MD [address] [place], [place] [address] PCP: Marton Redwood, MD   Assessment & Plan: Visit Diagnoses:  1. Unilateral primary osteoarthritis, right knee   2. Chronic left shoulder pain     Plan: Osteoarthritis medial compartment right knee.  Will inject with cortisone.  Impingement syndrome left shoulder will inject subacromial space with cortisone.  Has what appears to be symptoms consistent with a Morton's neuroma between the third and fourth toes of his left foot will have him return in several weeks and consider diagnostic injection  Follow-Up Instructions: Return in about 2 weeks (around 03/13/2018).   Orders:  Orders Placed This Encounter  Procedures  . Large Joint Inj: R knee  . Large Joint Inj: L subacromial bursa   No orders of the defined types were placed in this encounter.     Procedures: Large Joint Inj: R knee on 02/27/2018 3:05 PM Indications: pain and diagnostic evaluation Details: 25 G 1.5 in needle, anteromedial approach  Arthrogram: No  Medications: 2 mL bupivacaine 0.5 %; 2 mL lidocaine 1 %; 80 mg methylPREDNISolone acetate 40 MG/ML Procedure, treatment alternatives, risks and benefits explained, specific risks discussed. Consent was given by the patient. Immediately prior to procedure a time out was called to verify the correct patient, procedure, equipment, support staff and site/side marked as required. Patient was prepped and draped in the usual sterile fashion.   Large Joint Inj: L subacromial bursa on 02/27/2018 3:05 PM Indications: pain and diagnostic evaluation Details: 25 G 1.5 in needle, anterolateral approach  Arthrogram: No  Medications: 2 mL lidocaine 2 %; 2 mL bupivacaine 0.5 %; 80 mg methylPREDNISolone acetate 40 MG/ML Consent was given by the patient. Immediately  prior to procedure a time out was called to verify the correct patient, procedure, equipment, support staff and site/side marked as required. Patient was prepped and draped in the usual sterile fashion.       Clinical Data: No additional findings.   Subjective: Chief Complaint  Patient presents with  . Follow-up    R KNEE INJECTION, LAST INJECTION WA S7/9/19 , L SHOULDER INJECTION LAST INJECTION WAS 08/15/17  Machael has an established diagnosis of right knee osteoarthritis predominantly of the medial compartment.  He has had prior arthroscopy several years ago and a recent MRI scan confirming the arthritis.  He is had cortisone and Visco supplementation.  He is considered a unipolar first.  7 years ago had a subacromial cortisone injection for his left shoulder and having recurrent symptoms.  No numbness or tingling.  No neck pain  Also has had some recent problems with his left foot pain between the third and fourth toes he has had several evaluations outside the office possible diagnosis of Morton's neuroma.  He has been using comfortable shoes  HPI  Review of Systems  Constitutional: Negative for fatigue and fever.  HENT: Negative for ear pain.   Eyes: Negative for pain.  Respiratory: Negative for cough and shortness of breath.   Cardiovascular: Negative for leg swelling.  Gastrointestinal: Negative for constipation and diarrhea.  Genitourinary: Negative for difficulty urinating.  Musculoskeletal: Negative for back pain and neck pain.  Skin: Negative for rash.  Allergic/Immunologic:  Negative for food allergies.  Neurological: Positive for weakness. Negative for numbness.  Hematological: Does not bruise/bleed easily.  Psychiatric/Behavioral: Negative for sleep disturbance.     Objective: Vital Signs: BP 115/71 (BP Location: Left Arm, Patient Position: Sitting, Cuff Size: Normal)   Pulse (!) 52   Ht 6\' 2"  (1.88 m)   Wt 184 lb (83.5 kg)   BMI 23.62 kg/m   Physical Exam    Constitutional: He is oriented to person, place, and time. He appears well-developed and well-nourished.  HENT:  Mouth/Throat: Oropharynx is clear and moist.  Eyes: Pupils are equal, round, and reactive to light. EOM are normal.  Pulmonary/Chest: Effort normal.  Neurological: He is alert and oriented to person, place, and time.  Skin: Skin is warm and dry.  Psychiatric: He has a normal mood and affect. His behavior is normal.    Ortho Exam awake alert and oriented x3.  Comfortable sitting.  Right knee exam with a little bit of varus and mild medial joint pain.  No effusion.  No lateral pain or patellofemoral discomfort.  Full extension and flexion over 105 degrees.  Left shoulder with positive impingement and empty can testing.  Skin intact.  Neurovascular exam intact.  Biceps intact mild discomfort in the anterior subacromial region  Left foot exam with some tenderness between the third and fourth toes dorsally numbness or tingling skin intact.  No swelling.  No calluses.  Vascular exam intact  Specialty Comments:  No specialty comments available.  Imaging: No results found.   PMFS History: Patient Active Problem List   Diagnosis Date Noted  . Unilateral primary osteoarthritis, right knee 11/01/2017  . Chest pain 08/07/2017  . Bradycardia 08/07/2017  . History of right tennis elbow 05/09/2017  . Right tennis elbow 04/07/2017  . Prolapsed internal hemorrhoids, grade 3 06/03/2016  . Hx of adenomatous colonic polyps 05/27/2016   Past Medical History:  Diagnosis Date  . Arthritis    wrist, elbow, thumb, knee  . Bradycardia   . Glaucoma   . Hx of adenomatous colonic polyps 05/27/2016  . Inguinal hernia    left  . Internal hemorrhoids   . PVC's (premature ventricular contractions)     Family History  Problem Relation Age of Onset  . Heart disease Mother   . Alzheimer's disease Father   . Colon cancer Neg Hx   . Pancreatic cancer Neg Hx   . Prostate cancer Neg Hx   .  Rectal cancer Neg Hx     Past Surgical History:  Procedure Laterality Date  . COLONOSCOPY    . ELBOW SURGERY Right   . HEMORRHOID BANDING  2018  . HERNIA REPAIR Right   . INGUINAL HERNIA REPAIR Left 06/14/2017   Procedure: LEFT INGUINAL HERNIA REPAIR ERAS PATHWAY;  Surgeon: Erroll Luna, MD;  Location: [facility];  Service: General;  Laterality: Left;  . INSERTION OF MESH Left 06/14/2017   Procedure: INSERTION OF MESH;  Surgeon: Erroll Luna, MD;  Location: [facility];  Service: General;  Laterality: Left;  . MENISCUS REPAIR Right 09/2015  . TONSILLECTOMY     Social History   Occupational History  . Not on file  Tobacco Use  . Smoking status: Never Smoker  . Smokeless tobacco: Never Used  Substance and Sexual Activity  . Alcohol use: Yes    Comment: rare  . Drug use: No  . Sexual activity: Not on file

## 2018-03-02 ENCOUNTER — Telehealth: Payer: Self-pay

## 2018-03-02 NOTE — Telephone Encounter (Signed)
Left message for patient to call back  

## 2018-03-02 NOTE — Telephone Encounter (Signed)
-----   Message from Gatha Mayer, MD sent at 03/02/2018  1:15 PM EST ----- Regarding: banding appt I told him to call and set up a banding appointment and to tell staff I said it was ok  If tjis is a problem you will need to contact him but trying to do it this way as we discussed.

## 2018-03-06 NOTE — Telephone Encounter (Signed)
Hem banding #1 scheduled for 03/31/18

## 2018-03-06 NOTE — Telephone Encounter (Signed)
Left message for patient to call back  

## 2018-03-17 ENCOUNTER — Encounter (INDEPENDENT_AMBULATORY_CARE_PROVIDER_SITE_OTHER): Payer: Self-pay | Admitting: Orthopaedic Surgery

## 2018-03-17 ENCOUNTER — Ambulatory Visit (INDEPENDENT_AMBULATORY_CARE_PROVIDER_SITE_OTHER): Payer: PPO | Admitting: Orthopaedic Surgery

## 2018-03-17 VITALS — BP 118/64 | HR 43 | Ht 74.0 in | Wt 182.0 lb

## 2018-03-17 DIAGNOSIS — M79672 Pain in left foot: Secondary | ICD-10-CM | POA: Diagnosis not present

## 2018-03-17 MED ORDER — METHYLPREDNISOLONE ACETATE 40 MG/ML IJ SUSP
13.3300 mg | INTRAMUSCULAR | Status: AC | PRN
  Administered 2018-03-17: 13.33 mg

## 2018-03-17 MED ORDER — BUPIVACAINE HCL 0.5 % IJ SOLN
1.0000 mL | INTRAMUSCULAR | Status: AC | PRN
  Administered 2018-03-17: 1 mL

## 2018-03-17 MED ORDER — LIDOCAINE HCL 1 % IJ SOLN
1.0000 mL | INTRAMUSCULAR | Status: AC | PRN
  Administered 2018-03-17: 1 mL

## 2018-03-17 NOTE — Progress Notes (Signed)
Office Visit Note   Patient: Andrew Payne           Date of Birth: May 04, 1945           MRN: [id number] Visit Date: 03/17/2018              Requested by: Andrew Redwood, MD [address] [place],  [address] PCP: Andrew Redwood, MD   Assessment & Plan: Visit Diagnoses:  1. Left foot pain     Plan: Morton's neuroma left foot between the third and fourth toes.  From his diagnostic and therapeutic standpoint will inject with Xylocaine, Marcaine and Depo-Medrol to response  Follow-Up Instructions: Return if symptoms worsen or fail to improve.   Orders:  Orders Placed This Encounter  Procedures  . Foot Inj   No orders of the defined types were placed in this encounter.     Procedures: Foot Inj Date/Time: 03/17/2018 8:35 AM Performed by: Andrew Balding, MD Authorized by: Andrew Balding, MD   Consent Given by:  Patient Indications:  Neuroma Condition: Morton's Neuroma   Location:  L foot Needle Size:  27 G Approach:  Dorsal Medications:  1 mL lidocaine 1 %; 13.33 mg methylPREDNISolone acetate 40 MG/ML; 1 mL bupivacaine 0.5 %     Clinical Data: No additional findings.   Subjective: Chief Complaint  Patient presents with  . Left Foot - Pain  Patient presents with left foot pain for possible injection of Morton's neuroma.  He received left shoulder injection on 02/27/18 with no relief. He also had his right knee injected that day with only relief for about 10 days.  He denies taking anything for pain. Andrew Payne notes that he has seen several physicians in regards to his left foot with a questionable diagnosis.  He is having pain in the area of the third and fourthtoes of his left foot.  No obvious injury or trauma.  He is very active on his feet walking, playing golf working in the yard .  Describes the pain is occasional numbness and achiness.  No skin changes HPI  Review of Systems   Objective: Vital Signs: BP 118/64   Pulse (!) 43   Ht 6\' 2"  (1.88 m)    Wt 182 lb (82.6 kg)   BMI 23.37 kg/m   Physical Exam  Constitutional: He is oriented to person, place, and time. He appears well-developed and well-nourished.  HENT:  Mouth/Throat: Oropharynx is clear and moist.  Eyes: Pupils are equal, round, and reactive to light. EOM are normal.  Pulmonary/Chest: Effort normal.  Neurological: He is alert and oriented to person, place, and time.  Skin: Skin is warm and dry.  Psychiatric: He has a normal mood and affect. His behavior is normal.    Ortho Exam left foot exam with some tenderness between the third and fourth metatarsal heads.  No skin changes.  No toe edema.  Good capillary refill to the toe.  No malady with the nail.  Some pain with compression of the metatarsal heads in the same area  Specialty Comments:  No specialty comments available.  Imaging: No results found.   PMFS History: Patient Active Problem List   Diagnosis Date Noted  . Unilateral primary osteoarthritis, right knee 11/01/2017  . Chest pain 08/07/2017  . Bradycardia 08/07/2017  . History of right tennis elbow 05/09/2017  . Right tennis elbow 04/07/2017  . Prolapsed internal hemorrhoids, grade 3 06/03/2016  . Hx of adenomatous colonic polyps 05/27/2016   Past Medical  History:  Diagnosis Date  . Arthritis    wrist, elbow, thumb, knee  . Bradycardia   . Glaucoma   . Hx of adenomatous colonic polyps 05/27/2016  . Inguinal hernia    left  . Internal hemorrhoids   . PVC's (premature ventricular contractions)     Family History  Problem Relation Age of Onset  . Heart disease Mother   . Alzheimer's disease Father   . Colon cancer Neg Hx   . Pancreatic cancer Neg Hx   . Prostate cancer Neg Hx   . Rectal cancer Neg Hx     Past Surgical History:  Procedure Laterality Date  . COLONOSCOPY    . ELBOW SURGERY Right   . HEMORRHOID BANDING  2018  . HERNIA REPAIR Right   . INGUINAL HERNIA REPAIR Left 06/14/2017   Procedure: LEFT INGUINAL HERNIA REPAIR ERAS  PATHWAY;  Surgeon: Andrew Luna, MD;  Location: [facility];  Service: General;  Laterality: Left;  . INSERTION OF MESH Left 06/14/2017   Procedure: INSERTION OF MESH;  Surgeon: Andrew Luna, MD;  Location: [facility];  Service: General;  Laterality: Left;  . MENISCUS REPAIR Right 09/2015  . TONSILLECTOMY     Social History   Occupational History  . Not on file  Tobacco Use  . Smoking status: Never Smoker  . Smokeless tobacco: Never Used  Substance and Sexual Activity  . Alcohol use: Yes    Comment: rare  . Drug use: No  . Sexual activity: Not on file

## 2018-03-26 ENCOUNTER — Encounter (INDEPENDENT_AMBULATORY_CARE_PROVIDER_SITE_OTHER): Payer: Self-pay | Admitting: Orthopaedic Surgery

## 2018-03-31 ENCOUNTER — Encounter (INDEPENDENT_AMBULATORY_CARE_PROVIDER_SITE_OTHER): Payer: Self-pay | Admitting: Orthopaedic Surgery

## 2018-03-31 ENCOUNTER — Ambulatory Visit (INDEPENDENT_AMBULATORY_CARE_PROVIDER_SITE_OTHER): Payer: PPO | Admitting: Internal Medicine

## 2018-03-31 ENCOUNTER — Encounter: Payer: Self-pay | Admitting: Internal Medicine

## 2018-03-31 DIAGNOSIS — K642 Third degree hemorrhoids: Secondary | ICD-10-CM

## 2018-03-31 NOTE — Assessment & Plan Note (Signed)
LL banded again It has improved Hopefull last banding He knows to contact me after 2 mos if still symptomatic

## 2018-03-31 NOTE — Patient Instructions (Signed)
Please follow up as needed.  HEMORRHOID BANDING PROCEDURE    FOLLOW-UP CARE   1. The procedure you have had should have been relatively painless since the banding of the area involved does not have nerve endings and there is no pain sensation.  The rubber band cuts off the blood supply to the hemorrhoid and the band may fall off as soon as 48 hours after the banding (the band may occasionally be seen in the toilet bowl following a bowel movement). You may notice a temporary feeling of fullness in the rectum which should respond adequately to plain Tylenol or Motrin.  2. Following the banding, avoid strenuous exercise that evening and resume full activity the next day.  A sitz bath (soaking in a warm tub) or bidet is soothing, and can be useful for cleansing the area after bowel movements.     3. To avoid constipation, take two tablespoons of natural wheat bran, natural oat bran, flax, Benefiber or any over the counter fiber supplement and increase your water intake to 7-8 glasses daily.    4. Unless you have been prescribed anorectal medication, do not put anything inside your rectum for two weeks: No suppositories, enemas, fingers, etc.  5. Occasionally, you may have more bleeding than usual after the banding procedure.  This is often from the untreated hemorrhoids rather than the treated one.  Don't be concerned if there is a tablespoon or so of blood.  If there is more blood than this, lie flat with your bottom higher than your head and apply an ice pack to the area. If the bleeding does not stop within a half an hour or if you feel faint, call our office at [phone] or go to the emergency room.  6. Problems are not common; however, if there is a substantial amount of bleeding, severe pain, chills, fever or difficulty passing urine (very rare) or other problems, you should call us at [phone] or report to the nearest emergency room.  7. Do not stay seated continuously for more  than 2-3 hours for a day or two after the procedure.  Tighten your buttock muscles 10-15 times every two hours and take 10-15 deep breaths every 1-2 hours.  Do not spend more than a few minutes on the toilet if you cannot empty your bowel; instead re-visit the toilet at a later time.     

## 2018-03-31 NOTE — Progress Notes (Signed)
   Hemorrhoid ligation  Indications are recurrent prolapsing left lateral internal hemorrhoid banded multiple times, gradually improving the patient does not want surgery.  He has had improving intervals of prolapse free time with repeated banding's.  Rectal:  NL anoderm - no prolapse w/ valsalva NL DRE  Anoscopy was performed with the patient in the left lateral decubitus position while a chaperone was present and revealed Gr 2 LL internal hemorrhoids   PROCEDURE NOTE: The patient presents with symptomatic grade 2-3  hemorrhoids, requesting rubber band ligation of his/her hemorrhoidal disease.  All risks, benefits and alternative forms of therapy were described and informed consent was obtained.   The anorectum was pre-medicated with there 5% lidocaine and 0.125% nitroglycerin The decision was made to band the left lateral internal hemorrhoid, and the [place] was used to perform band ligation without complication.   2 bands were placed in this region Digital anorectal examination was then performed to assure proper positioning of the band, and to adjust the banded tissue as required.  The patient was discharged home without pain or other issues.  Dietary and behavioral recommendations were given and along with follow-up instructions.      The patient will return as needed after waiting 2 months for  follow-up and possible additional banding as required. No complications were encountered and the patient tolerated the procedure well.  I appreciate the opportunity to care for this patient. CC: Marton Redwood, MD

## 2018-06-19 DIAGNOSIS — M79672 Pain in left foot: Secondary | ICD-10-CM | POA: Diagnosis not present

## 2018-06-19 DIAGNOSIS — M205X2 Other deformities of toe(s) (acquired), left foot: Secondary | ICD-10-CM | POA: Diagnosis not present

## 2018-06-19 DIAGNOSIS — G5762 Lesion of plantar nerve, left lower limb: Secondary | ICD-10-CM | POA: Diagnosis not present

## 2018-06-19 DIAGNOSIS — M21962 Unspecified acquired deformity of left lower leg: Secondary | ICD-10-CM | POA: Diagnosis not present

## 2018-06-26 DIAGNOSIS — G5762 Lesion of plantar nerve, left lower limb: Secondary | ICD-10-CM | POA: Diagnosis not present

## 2018-06-27 DIAGNOSIS — Z01818 Encounter for other preprocedural examination: Secondary | ICD-10-CM | POA: Diagnosis not present

## 2018-07-04 ENCOUNTER — Ambulatory Visit: Payer: PPO | Admitting: Cardiology

## 2018-07-04 ENCOUNTER — Encounter: Payer: Self-pay | Admitting: Cardiology

## 2018-07-04 VITALS — BP 122/72 | HR 52 | Ht 73.0 in | Wt 186.0 lb

## 2018-07-04 DIAGNOSIS — I493 Ventricular premature depolarization: Secondary | ICD-10-CM | POA: Diagnosis not present

## 2018-07-04 DIAGNOSIS — I495 Sick sinus syndrome: Secondary | ICD-10-CM | POA: Diagnosis not present

## 2018-07-04 NOTE — Progress Notes (Signed)
Electrophysiology Office Note   Date:  07/04/2018   ID:  Andrew Payne, DOB [birth year], MRN [id number]  PCP:  Marton Redwood, MD  Cardiologist:   Primary Electrophysiologist:  [name], MD    No chief complaint on file.    History of Present Illness: Andrew Payne is a 73 y.o. male who is being seen today for the evaluation of bradycardia at the request of Ladell Pier. Presenting today for electrophysiology evaluation.  He has a history significant for PVCs and bradycardia.  He had a planned minor foot surgery yesterday.  Initial ECG showed sinus bradycardia with heart rates in the 40s.  He had a right bundle branch block.  He subsequently had heart rates in the low 30s with a junctional escape versus PVCs.  In the emergency room, the patient was noted to no longer have P waves on their 3-lead ECG.  Twelve-lead ECG also showed bradycardia without P waves as well as frequent PVCs.  He is currently able to do all of his daily activities.  He exercises by walking 5 to 7 miles daily.  He used to be a runner.  His foot surgery was for a neuroma.  He did have surgery in January for an umbilical hernia which went well.    Today, he denies symptoms of palpitations, chest pain, shortness of breath, orthopnea, PND, lower extremity edema, claudication, dizziness, presyncope, syncope, bleeding, or neurologic sequela. The patient is tolerating medications without difficulties.    Past Medical History:  Diagnosis Date  . Arthritis    wrist, elbow, thumb, knee  . Bradycardia   . Glaucoma   . Hx of adenomatous colonic polyps 05/27/2016  . Inguinal hernia    left  . Internal hemorrhoids   . PVC's (premature ventricular contractions)    Past Surgical History:  Procedure Laterality Date  . COLONOSCOPY    . ELBOW SURGERY Right   . HEMORRHOID BANDING  2018  . HERNIA REPAIR Right   . INGUINAL HERNIA REPAIR Left 06/14/2017   Procedure: LEFT INGUINAL HERNIA REPAIR ERAS PATHWAY;  Surgeon:  Erroll Luna, MD;  Location: [facility];  Service: General;  Laterality: Left;  . INSERTION OF MESH Left 06/14/2017   Procedure: INSERTION OF MESH;  Surgeon: Erroll Luna, MD;  Location: [facility];  Service: General;  Laterality: Left;  . MENISCUS REPAIR Right 09/2015  . TONSILLECTOMY       Current Outpatient Medications  Medication Sig Dispense Refill  . aspirin EC 81 MG tablet Take 324 mg by mouth once.    . Cholecalciferol (VITAMIN D3) 1000 units CAPS Take 1 capsule by mouth daily.    . Cyanocobalamin (VITAMIN B 12 PO) Take by mouth.    . dorzolamide (TRUSOPT) 2 % ophthalmic solution Place 1 drop into both eyes daily.    Marland Kitchen latanoprost (XALATAN) 0.005 % ophthalmic solution Place 1 drop into both eyes at bedtime.    . Omega-3 Fatty Acids (FISH OIL) 1000 MG CAPS Take 1 capsule by mouth 2 (two) times daily.     No current facility-administered medications for this visit.     Allergies:   Oxycodone   Social History:  The patient  reports that he has never smoked. He has never used smokeless tobacco. He reports current alcohol use. He reports that he does not use drugs.   Family History:  The patient's family history includes Alzheimer's disease in his father; Heart disease in his mother.  ROS:  Please see the history of present illness.   Otherwise, review of systems is positive for palpitations.   All other systems are reviewed and negative.    PHYSICAL EXAM: VS:  BP 122/72   Pulse (!) 52   Ht 6\' 1"  (1.854 m)   Wt 186 lb (84.4 kg)   BMI 24.54 kg/m  , BMI Body mass index is 24.54 kg/m. GEN: Well nourished, well developed, in no acute distress  HEENT: normal  Neck: no JVD, carotid bruits, or masses Cardiac: Bradycardic, regular; no murmurs, rubs, or gallops,no edema  Respiratory:  clear to auscultation bilaterally, normal work of breathing GI: soft, nontender, nondistended, + BS MS: no deformity or atrophy  Skin: warm and dry Neuro:   Strength and sensation are intact Psych: euthymic mood, full affect  EKG:  EKG is ordered today. Personal review of the ekg ordered shows that his bradycardia, ventricular bigeminy   Device interrogation is reviewed today in detail.  See PaceArt for details.   Recent Labs: 08/08/2017: ALT 29; BUN 17; Creatinine, Ser 1.21; Hemoglobin 13.7; Platelets 169; Potassium 4.0; Sodium 138; TSH 3.223    Lipid Panel     Component Value Date/Time   CHOL 182 08/08/2017 0032   TRIG 59 08/08/2017 0032   HDL 54 08/08/2017 0032   CHOLHDL 3.4 08/08/2017 0032   VLDL 12 08/08/2017 0032   LDLCALC 116 (H) 08/08/2017 0032     Wt Readings from Last 3 Encounters:  07/04/18 186 lb (84.4 kg)  03/31/18 185 lb (83.9 kg)  03/17/18 182 lb (82.6 kg)      Other studies Reviewed: Additional studies/ records that were reviewed today include: Coronary CTA 08/09/2017 Review of the above records today demonstrates:  1. Left Main:  No significant stenosis.  2. LAD: No significant stenosis. 3. LCX: No significant stenosis. 4. RCA: No significant stenosis.  IMPRESSION: 1.  CT FFR analysis didn't show any significant stenosis.   ASSESSMENT AND PLAN:  1.  PVCs associated with sick sinus syndrome: At this point he is minimally symptomatic.  He did have some junctional beats at the time of his foot surgery yesterday.  I would like to get a more accurate representation of his PVC burden.  We will thus order a Holter monitor as well as a transthoracic echo.  He is asymptomatic and is able to do all of his daily activities.  He would like to avoid pacemaker implantation.   Current medicines are reviewed at length with the patient today.   The patient does not have concerns regarding his medicines.  The following changes were made today:  none  Labs/ tests ordered today include:  Orders Placed This Encounter  Procedures  . HOLTER MONITOR - 48 HOUR  . EKG 12-Lead  . ECHOCARDIOGRAM COMPLETE      Disposition:   FU with Will Camnitz 1 months  Signed, [name], MD  07/04/2018 11:28 AM     [facility] [address] [place] [address] [phone] (office) [phone] (fax)

## 2018-07-04 NOTE — Patient Instructions (Addendum)
Medication Instructions:  Your physician recommends that you continue on your current medications as directed. Please refer to the Current Medication list given to you today.  * If you need a refill on your cardiac medications before your next appointment, please call your pharmacy.   Labwork: None ordered  Testing/Procedures: Your physician has requested that you have an echocardiogram. Echocardiography is a painless test that uses sound waves to create images of your heart. It provides your doctor with information about the size and shape of your heart and how well your heart's chambers and valves are working. This procedure takes approximately one hour. There are no restrictions for this procedure.  Your physician has recommended that you wear a 48 hour holter monitor. Holter monitors are medical devices that record the heart's electrical activity. Doctors most often use these monitors to diagnose arrhythmias. Arrhythmias are problems with the speed or rhythm of the heartbeat. The monitor is a small, portable device. You can wear one while you do your normal daily activities. This is usually used to diagnose what is causing palpitations/syncope (passing out).  Follow-Up: Follow up to be determined once above tests are completed.  Thank you for choosing [facility]!!   Trinidad Curet, RN [phone]  Any Other Special Instructions Will Be Listed Below (If Applicable).    Ambulatory Cardiac Monitoring An ambulatory cardiac monitor is a small recording device that is used to detect abnormal heart rhythms (arrhythmias). Most monitors are connected by wires to flat, sticky disks (electrodes) that are then attached to your chest. You may need to wear a monitor if you have had symptoms such as:  Fast heartbeats (palpitations).  Dizziness.  Fainting or light-headedness.  Unexplained weakness.  Shortness of breath. There are several types of monitors. Some common monitors  include:  Holter monitor. This records your heart rhythm continuously, usually for 24-48 hours.  Event (episodic) monitor. This monitor has a symptoms button, and when pushed, it will begin recording. You need to activate this monitor to record when you have a heart-related symptom.  Automatic detection monitor. This monitor will begin recording when it detects an abnormal heartbeat. What are the risks? Generally, these devices are safe to use. However, it is possible that the skin under the electrodes will become irritated. How to prepare for monitoring Your health care provider will prepare your chest for the electrode placement and show you how to use the monitor.  Do not apply lotions to your chest before monitoring.  Follow directions on how to care for the monitor, and how to return the monitor when the testing period is complete. How to use your cardiac monitor  Follow directions about how long to wear the monitor, and if you can take the monitor off in order to shower or bathe. ? Do not let the monitor get wet. ? Do not bathe, swim, or use a hot tub while wearing the monitor.  Keep your skin clean. Do not put body lotion or moisturizer on your chest.  Change the electrodes as told by your health care provider, or any time they stop sticking to your skin. You may need to use medical tape to keep them on.  Try to put the electrodes in slightly different places on your chest to help prevent skin irritation. Follow directions from your health care provider about where to place the electrodes.  Make sure the monitor is safely clipped to your clothing or in a location close to your body as recommended by your  health care provider.  If your monitor has a symptoms button, press the button to mark an event as soon as you feel a heart-related symptom, such as: ? Dizziness. ? Weakness. ? Light-headedness. ? Palpitations. ? Thumping or pounding in your chest. ? Shortness of  breath. ? Unexplained weakness.  Keep a diary of your activities, such as walking, doing chores, and taking medicine. It is very important to note what you were doing when you pushed the button to record your symptoms. This will help your health care provider determine what might be contributing to your symptoms.  Send the recorded information as recommended by your health care provider. It may take some time for your health care provider to process the results.  Change the batteries as told by your health care provider.  Keep electronic devices away from your monitor. These include: ? Tablets. ? MP3 players. ? Cell phones.  While wearing your monitor you should avoid: ? Electric blankets. ? Armed forces operational officer. ? Electric toothbrushes. ? Microwave ovens. ? Magnets. ? Metal detectors. Get help right away if:  You have chest pain.  You have shortness of breath or extreme difficulty breathing.  You develop a very fast heartbeat that does not get better.  You develop dizziness that does not go away.  You faint or constantly feel like you are about to faint. Summary  An ambulatory cardiac monitor is a small recording device that is used to detect abnormal heart rhythms (arrhythmias).  Make sure you understand how to send the information from the monitor to your health care provider.  It is important to press the button on the monitor when you have any heart-related symptoms.  Keep a diary of your activities, such as walking, doing chores, and taking medicine. It is very important to note what you were doing when you pushed the button to record your symptoms. This will help your health care provider learn what might be causing your symptoms. This information is not intended to replace advice given to you by your health care provider. Make sure you discuss any questions you have with your health care provider. Document Released: 01/20/2008 Document Revised: 01/26/2017 Document  Reviewed: 03/27/2016 Elsevier Interactive Patient Education  2019 [employer].

## 2018-07-13 ENCOUNTER — Telehealth: Payer: Self-pay | Admitting: Internal Medicine

## 2018-07-13 NOTE — Telephone Encounter (Signed)
Called pt    Reviewed with Dr Curt Bears  Given corona virus pandemic it is OK to postpone studies until infection risk subsides  Someone from office will call to schedule in later April/May

## 2018-07-14 ENCOUNTER — Ambulatory Visit (HOSPITAL_COMMUNITY): Payer: PPO

## 2018-07-19 ENCOUNTER — Encounter: Payer: Self-pay | Admitting: *Deleted

## 2018-07-19 ENCOUNTER — Telehealth: Payer: Self-pay | Admitting: *Deleted

## 2018-07-19 NOTE — Telephone Encounter (Signed)
Monitor being mailed to [employer] address.  Log book and instructions come with kit.  You will mail the monitor back by attaching monitor to last page of Log book, putting log book in original shipping box which has prepaid postage.  Sealing the box and dropping it in a mail box.

## 2018-07-19 NOTE — Progress Notes (Signed)
Patient ID: Andrew Payne, male   DOB: 03/17/1946, 73 y.o.   MRN: [id number] Patient enrolled for [employer] to ship a 3 day [employer] XT monitor to [address], [place], [place] [address] Theodore Demark has been notified to bill at his [place] address.

## 2018-07-26 NOTE — Telephone Encounter (Signed)
Follow up   Please disregard one of the below message-one was sent in error.

## 2018-07-26 NOTE — Telephone Encounter (Signed)
Patient had called [employer].  Not only did they ship 2 monitors, they were delivered to the billing address, not the shipping address.  [employer] instructed patient to mail one monitor back, which he did.  He will be applying monitor serial # [id number] tomorrow morning after his shower.

## 2018-07-26 NOTE — Telephone Encounter (Signed)
Follow up   Patient states that he received 2 heart monitors and would like a call to discuss this. Please call to discuss.

## 2018-07-26 NOTE — Telephone Encounter (Signed)
Follow up   Patient states that he received 2 heart monitors in the mail. He would like a call to discuss.

## 2018-07-27 ENCOUNTER — Ambulatory Visit (INDEPENDENT_AMBULATORY_CARE_PROVIDER_SITE_OTHER): Payer: PPO

## 2018-07-27 DIAGNOSIS — I493 Ventricular premature depolarization: Secondary | ICD-10-CM | POA: Diagnosis not present

## 2018-07-27 DIAGNOSIS — I495 Sick sinus syndrome: Secondary | ICD-10-CM | POA: Diagnosis not present

## 2018-07-27 DIAGNOSIS — Z853 Personal history of malignant neoplasm of breast: Secondary | ICD-10-CM | POA: Diagnosis not present

## 2018-07-27 DIAGNOSIS — R928 Other abnormal and inconclusive findings on diagnostic imaging of breast: Secondary | ICD-10-CM | POA: Diagnosis not present

## 2018-08-02 ENCOUNTER — Other Ambulatory Visit: Payer: Self-pay | Admitting: Cardiology

## 2018-08-02 ENCOUNTER — Other Ambulatory Visit: Payer: Self-pay

## 2018-08-02 DIAGNOSIS — I493 Ventricular premature depolarization: Secondary | ICD-10-CM

## 2018-08-02 DIAGNOSIS — I495 Sick sinus syndrome: Secondary | ICD-10-CM

## 2018-08-08 ENCOUNTER — Telehealth: Payer: Self-pay | Admitting: *Deleted

## 2018-08-08 NOTE — Telephone Encounter (Signed)
-----   Message from [name], MD sent at 08/03/2018 12:01 PM EDT ----- Short episodes of SVT. No evidence of bradycardia while awake. Rare PVCs.

## 2018-08-08 NOTE — Telephone Encounter (Signed)
lmtcb

## 2018-08-10 NOTE — Telephone Encounter (Signed)
Informed patient of results and verbal understanding expressed.  

## 2018-08-10 NOTE — Telephone Encounter (Signed)
Pt called back returning your call °

## 2018-08-30 DIAGNOSIS — E7849 Other hyperlipidemia: Secondary | ICD-10-CM | POA: Diagnosis not present

## 2018-08-30 DIAGNOSIS — R82998 Other abnormal findings in urine: Secondary | ICD-10-CM | POA: Diagnosis not present

## 2018-08-30 DIAGNOSIS — Z125 Encounter for screening for malignant neoplasm of prostate: Secondary | ICD-10-CM | POA: Diagnosis not present

## 2018-09-01 ENCOUNTER — Ambulatory Visit (HOSPITAL_COMMUNITY): Payer: PPO | Attending: Internal Medicine

## 2018-09-01 ENCOUNTER — Other Ambulatory Visit: Payer: Self-pay

## 2018-09-01 DIAGNOSIS — I495 Sick sinus syndrome: Secondary | ICD-10-CM | POA: Diagnosis not present

## 2018-09-01 DIAGNOSIS — I493 Ventricular premature depolarization: Secondary | ICD-10-CM

## 2018-09-06 DIAGNOSIS — Z1331 Encounter for screening for depression: Secondary | ICD-10-CM | POA: Diagnosis not present

## 2018-09-06 DIAGNOSIS — I2510 Atherosclerotic heart disease of native coronary artery without angina pectoris: Secondary | ICD-10-CM | POA: Diagnosis not present

## 2018-09-06 DIAGNOSIS — G5762 Lesion of plantar nerve, left lower limb: Secondary | ICD-10-CM | POA: Diagnosis not present

## 2018-09-06 DIAGNOSIS — R001 Bradycardia, unspecified: Secondary | ICD-10-CM | POA: Diagnosis not present

## 2018-09-06 DIAGNOSIS — I6529 Occlusion and stenosis of unspecified carotid artery: Secondary | ICD-10-CM | POA: Diagnosis not present

## 2018-09-06 DIAGNOSIS — Z0000 Encounter for general adult medical examination without abnormal findings: Secondary | ICD-10-CM | POA: Diagnosis not present

## 2018-09-06 DIAGNOSIS — M179 Osteoarthritis of knee, unspecified: Secondary | ICD-10-CM | POA: Diagnosis not present

## 2018-09-06 DIAGNOSIS — E785 Hyperlipidemia, unspecified: Secondary | ICD-10-CM | POA: Diagnosis not present

## 2018-09-06 DIAGNOSIS — H409 Unspecified glaucoma: Secondary | ICD-10-CM | POA: Diagnosis not present

## 2018-09-06 DIAGNOSIS — Z86010 Personal history of colonic polyps: Secondary | ICD-10-CM | POA: Diagnosis not present

## 2018-09-13 ENCOUNTER — Telehealth: Payer: Self-pay

## 2018-09-13 NOTE — Telephone Encounter (Signed)
-----   Message from [name], MD sent at 09/11/2018 11:24 AM EDT ----- Patient without obvious need for pacing. Would be at intermediate risk for an intermediate risk procedure. Would have anesthesia have dopamine available if needed and avoid AV nodal blockers. ----- Message ----- From: Stanton Kidney, RN Sent: 09/08/2018  11:22 AM EDT To: Constance Haw, MD, Stanton Kidney, RN  Informed patient of results and verbal understanding expressed. OV note -  He had a planned minor foot surgery yesterday.  Initial ECG showed sinus bradycardia with heart rates in the 40s.  He had a right bundle branch block.  He subsequently had heart rates in the low 30s with a junctional escape versus PVCs. Dr. Curt Bears please advise if cleared to proceed w/ procedure or if OV needed to further discuss

## 2018-09-13 NOTE — Telephone Encounter (Signed)
Spoke with the pt re: Dr. Curt Bears note for his potential upcoming surgery on his foot with Dr. Rosemary Holms [phone]... pt has many questions he says he needs to talk with DR. Camnitz about... he is asking for a video visit with him soon.. I scheduled him for tomorrow with Dr. Curt Bears... pt says he just needs more time to talk with Dr. Curt Bears about some pertinent questions he has.      Virtual Visit Pre-Appointment Phone Call  "(Name), I am calling you today to discuss your upcoming appointment. We are currently trying to limit exposure to the virus that causes COVID-19 by seeing patients at home rather than in the office."  1. "What is the BEST phone number to call the day of the visit?" - include this in appointment notes  2. "Do you have or have access to (through a family member/friend) a smartphone with video capability that we can use for your visit?" a. If yes - list this number in appt notes as "cell" (if different from BEST phone #) and list the appointment type as a VIDEO visit in appointment notes b. If no - list the appointment type as a PHONE visit in appointment notes  3. Confirm consent - "In the setting of the current Covid19 crisis, you are scheduled for a (phone or video) visit with your provider on (date) at (time).  Just as we do with many in-office visits, in order for you to participate in this visit, we must obtain consent.  If you'd like, I can send this to your mychart (if signed up) or email for you to review.  Otherwise, I can obtain your verbal consent now.  All virtual visits are billed to your insurance company just like a normal visit would be.  By agreeing to a virtual visit, we'd like you to understand that the technology does not allow for your provider to perform an examination, and thus may limit your provider's ability to fully assess your condition. If your provider identifies any concerns that need to be evaluated in person, we will make arrangements to do  so.  Finally, though the technology is pretty good, we cannot assure that it will always work on either your or our end, and in the setting of a video visit, we may have to convert it to a phone-only visit.  In either situation, we cannot ensure that we have a secure connection.  Are you willing to proceed?" STAFF: Did the patient verbally acknowledge consent to telehealth visit? Document YES/NO here: YES  4. Advise patient to be prepared - "Two hours prior to your appointment, go ahead and check your blood pressure, pulse, oxygen saturation, and your weight (if you have the equipment to check those) and write them all down. When your visit starts, your provider will ask you for this information. If you have an Apple Watch or Kardia device, please plan to have heart rate information ready on the day of your appointment. Please have a pen and paper handy nearby the day of the visit as well."  5. Give patient instructions for MyChart download to smartphone OR Doximity/Doxy.me as below if video visit (depending on what platform provider is using)  6. Inform patient they will receive a phone call 15 minutes prior to their appointment time (may be from unknown caller ID) so they should be prepared to answer    TELEPHONE CALL NOTE  Andrew Payne has been deemed a candidate for a follow-up tele-health visit to  limit community exposure during the Covid-19 pandemic. I spoke with the patient via phone to ensure availability of phone/video source, confirm preferred email & phone number, and discuss instructions and expectations.  I reminded Andrew Payne to be prepared with any vital sign and/or heart rhythm information that could potentially be obtained via home monitoring, at the time of his visit. I reminded Andrew Payne to expect a phone call prior to his visit.  Stephani Police, RN 09/13/2018 10:50 AM   INSTRUCTIONS FOR DOWNLOADING THE MYCHART APP TO SMARTPHONE  - The patient must first make sure to have  activated MyChart and know their login information - If Apple, go to [employer] and type in MyChart in the search bar and download the app. If Android, ask patient to go to [employer] and type in [place] in the search bar and download the app. The app is free but as with any other app downloads, their phone may require them to verify saved payment information or Apple/Android password.  - The patient will need to then log into the app with their MyChart username and password, and select [facility] as their healthcare provider to link the account. When it is time for your visit, go to the MyChart app, find appointments, and click Begin Video Visit. Be sure to Select Allow for your device to access the Microphone and Camera for your visit. You will then be connected, and your provider will be with you shortly.  **If they have any issues connecting, or need assistance please contact MyChart service desk [phone])**  **If using a computer, in order to ensure the best quality for their visit they will need to use either of the following Internet Browsers: [employer], or Google Chrome**  IF USING DOXIMITY or DOXY.ME - The patient will receive a link just prior to their visit by text.     FULL LENGTH CONSENT FOR TELE-HEALTH VISIT   I hereby voluntarily request, consent and authorize [facility] and its employed or contracted physicians, physician assistants, nurse practitioners or other licensed health care professionals (the Practitioner), to provide me with telemedicine health care services (the "Services") as deemed necessary by the treating Practitioner. I acknowledge and consent to receive the Services by the Practitioner via telemedicine. I understand that the telemedicine visit will involve communicating with the Practitioner through live audiovisual communication technology and the disclosure of certain medical information by electronic transmission. I acknowledge that  I have been given the opportunity to request an in-person assessment or other available alternative prior to the telemedicine visit and am voluntarily participating in the telemedicine visit.  I understand that I have the right to withhold or withdraw my consent to the use of telemedicine in the course of my care at any time, without affecting my right to future care or treatment, and that the Practitioner or I may terminate the telemedicine visit at any time. I understand that I have the right to inspect all information obtained and/or recorded in the course of the telemedicine visit and may receive copies of available information for a reasonable fee.  I understand that some of the potential risks of receiving the Services via telemedicine include:  Marland Kitchen Delay or interruption in medical evaluation due to technological equipment failure or disruption; . Information transmitted may not be sufficient (e.g. poor resolution of images) to allow for appropriate medical decision making by the Practitioner; and/or  . In rare instances, security protocols could fail, causing a  breach of personal health information.  Furthermore, I acknowledge that it is my responsibility to provide information about my medical history, conditions and care that is complete and accurate to the best of my ability. I acknowledge that Practitioner's advice, recommendations, and/or decision may be based on factors not within their control, such as incomplete or inaccurate data provided by me or distortions of diagnostic images or specimens that may result from electronic transmissions. I understand that the practice of medicine is not an exact science and that Practitioner makes no warranties or guarantees regarding treatment outcomes. I acknowledge that I will receive a copy of this consent concurrently upon execution via email to the email address I last provided but may also request a printed copy by calling the office of [facility].     I understand that my insurance will be billed for this visit.   I have read or had this consent read to me. . I understand the contents of this consent, which adequately explains the benefits and risks of the Services being provided via telemedicine.  . I have been provided ample opportunity to ask questions regarding this consent and the Services and have had my questions answered to my satisfaction. . I give my informed consent for the services to be provided through the use of telemedicine in my medical care  By participating in this telemedicine visit I agree to the above.

## 2018-09-14 ENCOUNTER — Telehealth (INDEPENDENT_AMBULATORY_CARE_PROVIDER_SITE_OTHER): Payer: PPO | Admitting: Cardiology

## 2018-09-14 ENCOUNTER — Other Ambulatory Visit: Payer: Self-pay

## 2018-09-14 DIAGNOSIS — R001 Bradycardia, unspecified: Secondary | ICD-10-CM

## 2018-09-14 NOTE — Progress Notes (Signed)
Electrophysiology TeleHealth Note   Due to national recommendations of social distancing due to COVID 19, an audio/video telehealth visit is felt to be most appropriate for this patient at this time.  See Epic message for the patient's consent to telehealth for [facility].   Date:  09/14/2018   ID:  Andrew Payne, DOB 1945/09/02, MRN [id number]  Location: patient's home  Provider location: [address] [place]  Evaluation Performed: Follow-up visit  PCP:  Marton Redwood, MD  Cardiologist:  No primary care provider on file.  Electrophysiologist:  Dr Curt Bears  Chief Complaint:  Preop consult  History of Present Illness:    Andrew Payne is a 73 y.o. male who presents via audio/video conferencing for a telehealth visit today.  Since last being seen in our clinic, the patient reports doing very well.  Today, he denies symptoms of palpitations, chest pain, shortness of breath,  lower extremity edema, dizziness, presyncope, or syncope.  The patient is otherwise without complaint today.  The patient denies symptoms of fevers, chills, cough, or new SOB worrisome for COVID 19.  He had a planned minor foot surgery.  Initial ECG showed sinus bradycardia with heart rate in the 40s.  He had a right bundle branch block at the time.  He subsequently developed heart rates in the low 30s with a junctional escape.  He was sent to the emergency room with a 3-lead ECG showing a loss of P waves.  Twelve-lead ECG showed bradycardia without P waves but frequent PVCs.  Today, denies symptoms of palpitations, chest pain, shortness of breath, orthopnea, PND, lower extremity edema, claudication, dizziness, presyncope, syncope, bleeding, or neurologic sequela. The patient is tolerating medications without difficulties.  He is feeling well.  He has had no episodes of weakness or fatigue certainly not while at rest.  He does say that when he is walking uphill he gets fatigued for approximately 30  seconds.  He has unsure if this is related.  Past Medical History:  Diagnosis Date  . Arthritis    wrist, elbow, thumb, knee  . Bradycardia   . Glaucoma   . Hx of adenomatous colonic polyps 05/27/2016  . Inguinal hernia    left  . Internal hemorrhoids   . PVC's (premature ventricular contractions)     Past Surgical History:  Procedure Laterality Date  . COLONOSCOPY    . ELBOW SURGERY Right   . HEMORRHOID BANDING  2018  . HERNIA REPAIR Right   . INGUINAL HERNIA REPAIR Left 06/14/2017   Procedure: LEFT INGUINAL HERNIA REPAIR ERAS PATHWAY;  Surgeon: Erroll Luna, MD;  Location: [facility];  Service: General;  Laterality: Left;  . INSERTION OF MESH Left 06/14/2017   Procedure: INSERTION OF MESH;  Surgeon: Erroll Luna, MD;  Location: [facility];  Service: General;  Laterality: Left;  . MENISCUS REPAIR Right 09/2015  . TONSILLECTOMY      Current Outpatient Medications  Medication Sig Dispense Refill  . aspirin EC 81 MG tablet Take 324 mg by mouth once.    . Cholecalciferol (VITAMIN D3) 1000 units CAPS Take 1 capsule by mouth daily.    . Cyanocobalamin (VITAMIN B 12 PO) Take by mouth.    . dorzolamide (TRUSOPT) 2 % ophthalmic solution Place 1 drop into both eyes daily.    Marland Kitchen latanoprost (XALATAN) 0.005 % ophthalmic solution Place 1 drop into both eyes at bedtime.    . Omega-3 Fatty Acids (FISH OIL) 1000  MG CAPS Take 1 capsule by mouth 2 (two) times daily.     No current facility-administered medications for this visit.     Allergies:   Oxycodone   Social History:  The patient  reports that he has never smoked. He has never used smokeless tobacco. He reports current alcohol use. He reports that he does not use drugs.   Family History:  The patient's  family history includes Alzheimer's disease in his father; Heart disease in his mother.   ROS:  Please see the history of present illness.   All other systems are personally reviewed and negative.     Exam:    Vital Signs:  There were no vitals taken for this visit.  Well appearing, alert and conversant, regular work of breathing,  good skin color Eyes- anicteric, neuro- grossly intact, skin- no apparent rash or lesions or cyanosis, mouth- oral mucosa is pink   Labs/Other Tests and Data Reviewed:    Recent Labs: No results found for requested labs within last 8760 hours.   Wt Readings from Last 3 Encounters:  07/04/18 186 lb (84.4 kg)  03/31/18 185 lb (83.9 kg)  03/17/18 182 lb (82.6 kg)     Other studies personally reviewed: Additional studies/ records that were reviewed today include: Echo 09/01/2018  Review of the above records today demonstrates:    1. The left ventricle has normal systolic function, with an ejection fraction of 55-60%. The cavity size was normal. Left ventricular diastolic parameters were normal. No evidence of left ventricular regional wall motion abnormalities.  2. The right ventricle has normal systolic function. The cavity was normal. There is no increase in right ventricular wall thickness.  3. Left atrial size was mildly dilated.  4. Mild thickening of the mitral valve leaflet. There is mild mitral annular calcification present.  5. The aortic valve is tricuspid. Mild thickening of the aortic valve. Mild calcification of the aortic valve. Mild aortic annular calcification noted.  6. The aortic root is normal in size and structure.  Long-term monitor for 01/14/2019 personally reviewed   ASSESSMENT & PLAN:    1.  Preoperative consult: Patient had a junctional rhythm on admission to the hospital.  He has since had an echo which showed no major abnormalities as well as when a cardiac monitor.  He did have bradycardia while sleeping, but had no symptoms and no bradycardia during waking hours.  He was in sinus rhythm the entire time with some evidence of type II heart block, but this also occurred while sleeping.  I do not see an indication for pacemaker  implantation at this time.  I think that he would be an intermediate risk for an intermediate risk operation.  During the operation, would have dopamine on hand if he becomes bradycardic.  He did have some nonsustained VT of up to 4 beats as well some SVT.  Would try to avoid AV nodal blockers.  Otherwise no further work-up is necessary.   COVID 19 screen The patient denies symptoms of COVID 19 at this time.  The importance of social distancing was discussed today.  Follow-up:  PRN  Current medicines are reviewed at length with the patient today.   The patient does not have concerns regarding his medicines.  The following changes were made today:  none  Labs/ tests ordered today include:  No orders of the defined types were placed in this encounter.    Patient Risk:  after full review of this patients clinical status,  I feel that they are at moderate risk at this time.  Today, I have spent 9 minutes with the patient with telehealth technology discussing preoperative evaluation, bradycardia.    Signed, Dannell Raczkowski Meredith Leeds, MD  09/14/2018 3:55 PM     [facility] [address] [place] [address] [phone] (office) [phone] (fax)

## 2018-10-09 DIAGNOSIS — G5762 Lesion of plantar nerve, left lower limb: Secondary | ICD-10-CM | POA: Diagnosis not present

## 2018-10-12 NOTE — Anesthesia Preprocedure Evaluation (Addendum)
Anesthesia Evaluation    Airway Mallampati: II  TM Distance: >3 FB Neck ROM: Full    Dental no notable dental hx.    Pulmonary    Pulmonary exam normal breath sounds clear to auscultation       Cardiovascular + CAD  Normal cardiovascular exam+ dysrhythmias  Rhythm:Regular Rate:Normal  EKG: 07/04/2018 Rate 52 bpm Sinus bradycardia with frequent PVCs in a pattern of bigeminy Right bundle branch block  Inferior infarct, age undetermined  CV: Echo 09/01/2018 IMPRESSIONS 1. The left ventricle has normal systolic function, with an ejection fraction of 55-60%. The cavity size was normal. Left ventricular diastolic parameters were normal. No evidence of left ventricular regional wall motion abnormalities. 2. The right ventricle has normal systolic function. The cavity was normal. There is no increase in right ventricular wall thickness. 3. Left atrial size was mildly dilated. 4. Mild thickening of the mitral valve leaflet. There is mild mitral annular calcification present. 5. The aortic valve is tricuspid. Mild thickening of the aortic valve. Mild calcification of the aortic valve. Mild aortic annular calcification noted. 6. The aortic root is normal in size and structure.  Stress Test 04/15/2005 Overall Impression: Low risk stress nuclear study.  No definite ischemia with excellent exercise tolerance.     Neuro/Psych    GI/Hepatic   Endo/Other    Renal/GU      Musculoskeletal   Abdominal   Peds  Hematology   Anesthesia Other Findings   Reproductive/Obstetrics                             Anesthesia Physical Anesthesia Plan  ASA: III  Anesthesia Plan: MAC and Regional   Post-op Pain Management:    Induction:   PONV Risk Score and Plan: 2 and Ondansetron and Treatment may vary due to age or medical condition  Airway Management Planned: Oral ETT and LMA  Additional Equipment:    Intra-op Plan:   Post-operative Plan: Extubation in OR  Informed Consent: I have reviewed the patients History and Physical, chart, labs and discussed the procedure including the risks, benefits and alternatives for the proposed anesthesia with the patient or authorized representative who has indicated his/her understanding and acceptance.     Dental advisory given  Plan Discussed with: CRNA, Anesthesiologist and Surgeon  Anesthesia Plan Comments: (Known brady arrhythmias, full workup by Dr. Curt Bears, no pacemaker recommended. Appears brady episodes occur while sleep. Likely candidate for regional anesthetic with minimal sedation.  )       Anesthesia Quick Evaluation

## 2018-10-17 DIAGNOSIS — L84 Corns and callosities: Secondary | ICD-10-CM | POA: Diagnosis not present

## 2018-10-17 DIAGNOSIS — L570 Actinic keratosis: Secondary | ICD-10-CM | POA: Diagnosis not present

## 2018-10-17 DIAGNOSIS — L821 Other seborrheic keratosis: Secondary | ICD-10-CM | POA: Diagnosis not present

## 2018-10-17 DIAGNOSIS — D1801 Hemangioma of skin and subcutaneous tissue: Secondary | ICD-10-CM | POA: Diagnosis not present

## 2018-10-17 DIAGNOSIS — D2262 Melanocytic nevi of left upper limb, including shoulder: Secondary | ICD-10-CM | POA: Diagnosis not present

## 2018-10-17 DIAGNOSIS — D2272 Melanocytic nevi of left lower limb, including hip: Secondary | ICD-10-CM | POA: Diagnosis not present

## 2018-10-17 DIAGNOSIS — D225 Melanocytic nevi of trunk: Secondary | ICD-10-CM | POA: Diagnosis not present

## 2018-10-18 ENCOUNTER — Other Ambulatory Visit: Payer: Self-pay | Admitting: Podiatry

## 2018-10-19 ENCOUNTER — Other Ambulatory Visit (HOSPITAL_COMMUNITY)
Admission: RE | Admit: 2018-10-19 | Discharge: 2018-10-19 | Disposition: A | Discharge status: Home / Self Care | Payer: PPO | Source: Ambulatory Visit | Attending: Podiatry | Admitting: Podiatry

## 2018-10-19 DIAGNOSIS — Z1159 Encounter for screening for other viral diseases: Secondary | ICD-10-CM | POA: Insufficient documentation

## 2018-10-19 LAB — SARS CORONAVIRUS 2 (TAT 6-24 HRS): SARS Coronavirus 2: NEGATIVE

## 2018-10-20 ENCOUNTER — Encounter (HOSPITAL_BASED_OUTPATIENT_CLINIC_OR_DEPARTMENT_OTHER): Payer: Self-pay | Admitting: *Deleted

## 2018-10-20 ENCOUNTER — Other Ambulatory Visit: Payer: Self-pay

## 2018-10-20 NOTE — Progress Notes (Signed)
Spoke w/ pt via phone for pre-op interview.  Npo after mn.  Arrive at [facility].  Current ekg in chart and epic.  Will eye drop as ususal am dos.  Pt had covid test done 10-19-2018.  Pt pcp H&P dated 10-20-2018 received via fax from dr Fritzi Mandes office with office note , in chart.  Also have dr Fritzi Mandes office notes with chart.  And pt cardiology note dated 09-14-2018 w/ clearance in chart and epic,  dr Curt Bears.  Anesthesia consult , dr Smith Robert, dated 10-12-2018 in epic and chart.  Chart reviewed by anesthesia , Claudina Lick PA,  ok to proceed.  Pt denies cardiac S&S or SOB.

## 2018-10-20 NOTE — Progress Notes (Signed)
Anesthesia Chart Review   Case: [id number] Date/Time: 10/23/18 1000   Procedure: EXCISION MORTON'S NEUROMA LEFT FOOT (Left )   Anesthesia type: Regional   Pre-op diagnosis: LESION PLANTAR NERVE LEFT FOOT   Location: [facility] OR ROOM .5 / [facility]   Surgeon: Rosemary Holms, DPM      DISCUSSION:72 y.o. 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    Pt previously scheduled for foot surgery 07/03/2018.  Surgery cancelled due to sinus bradycardia with heart rates in 30-40s, pt sent to ED.  Seen by Dr. Allegra Lai for evaluation.  Per Dr. Curt Bears telephone encounter 09/13/2018, "Patient without obvious need for pacing.  Would be an intermediate risk for an intermediate risk procedure.  Would have anesthesia have dopamine available if needed and avoid AV nodal blockers."  Pt is to follow up with Dr. Curt Bears prn.    Per Dr. Smith Robert, "Known brady arrhythmias, full workup by Dr. Curt Bears, no pacemaker recommended. Appears brady episodes occur while sleep. Likely candidate for regional anesthetic with minimal sedation."  Anticipate pt can proceed with planned procedure barring acute status change.   VS: There were no vitals taken for this visit.  PROVIDERSMarton Redwood, MD is PCP   Allegra Lai, MD is Cardiologist  LABS: SDW (all labs ordered are listed, but only abnormal results are displayed)  Labs Reviewed - No data to display   IMAGES:   EKG: 07/04/2018 Rate 52 bpm Sinus bradycardia with frequent PVCs in a pattern of bigeminy Right bundle branch block  Inferior infarct, age undetermined  CV: Echo 09/01/2018 IMPRESSIONS    1. The left ventricle has normal systolic function, with an ejection fraction of 55-60%. The cavity size was normal. Left ventricular diastolic parameters were normal. No evidence of left ventricular regional wall motion abnormalities.  2. The right ventricle has normal  systolic function. The cavity was normal. There is no increase in right ventricular wall thickness.  3. Left atrial size was mildly dilated.  4. Mild thickening of the mitral valve leaflet. There is mild mitral annular calcification present.  5. The aortic valve is tricuspid. Mild thickening of the aortic valve. Mild calcification of the aortic valve. Mild aortic annular calcification noted.  6. The aortic root is normal in size and structure.  Stress Test 04/15/2005 Overall Impression: Low risk stress nuclear study.  No definite ischemia with excellent exercise tolerance.   Past Medical History:  Diagnosis Date  . Arthritis    wrist, elbow, thumb, knee  . Bradycardia   . Glaucoma   . Hx of adenomatous colonic polyps 05/27/2016  . Inguinal hernia    left  . Internal hemorrhoids   . PVC's (premature ventricular contractions)     Past Surgical History:  Procedure Laterality Date  . COLONOSCOPY    . ELBOW SURGERY Right   . HEMORRHOID BANDING  2018  . HERNIA REPAIR Right   . INGUINAL HERNIA REPAIR Left 06/14/2017   Procedure: LEFT INGUINAL HERNIA REPAIR ERAS PATHWAY;  Surgeon: Erroll Luna, MD;  Location: [facility];  Service: General;  Laterality: Left;  . INSERTION OF MESH Left 06/14/2017   Procedure: INSERTION OF MESH;  Surgeon: Erroll Luna, MD;  Location: [facility];  Service: General;  Laterality: Left;  . MENISCUS REPAIR Right 09/2015  . TONSILLECTOMY      MEDICATIONS: No current facility-administered medications for this encounter.    Marland Kitchen aspirin EC  81 MG tablet  . Cholecalciferol (VITAMIN D3) 1000 units CAPS  . Cyanocobalamin (VITAMIN B 12 PO)  . dorzolamide (TRUSOPT) 2 % ophthalmic solution  . latanoprost (XALATAN) 0.005 % ophthalmic solution  . Omega-3 Fatty Acids (FISH OIL) 1000 MG CAPS    [facility] Pre-Surgical Testing [phone] 10/20/18  9:32 AM

## 2018-10-20 NOTE — Progress Notes (Signed)

## 2018-10-23 ENCOUNTER — Encounter (HOSPITAL_BASED_OUTPATIENT_CLINIC_OR_DEPARTMENT_OTHER): Payer: Self-pay

## 2018-10-23 ENCOUNTER — Other Ambulatory Visit: Payer: Self-pay

## 2018-10-23 ENCOUNTER — Encounter (HOSPITAL_BASED_OUTPATIENT_CLINIC_OR_DEPARTMENT_OTHER)
Admission: RE | Disposition: A | Discharge status: Home / Self Care | Payer: Self-pay | Source: Home / Self Care | Attending: Podiatry

## 2018-10-23 ENCOUNTER — Ambulatory Visit (HOSPITAL_BASED_OUTPATIENT_CLINIC_OR_DEPARTMENT_OTHER): Payer: PPO | Admitting: Anesthesiology

## 2018-10-23 ENCOUNTER — Ambulatory Visit (HOSPITAL_BASED_OUTPATIENT_CLINIC_OR_DEPARTMENT_OTHER)
Admission: RE | Admit: 2018-10-23 | Discharge: 2018-10-23 | Disposition: A | Discharge status: Home / Self Care | Payer: PPO | Attending: Podiatry | Admitting: Podiatry

## 2018-10-23 DIAGNOSIS — M1990 Unspecified osteoarthritis, unspecified site: Secondary | ICD-10-CM | POA: Diagnosis not present

## 2018-10-23 DIAGNOSIS — I493 Ventricular premature depolarization: Secondary | ICD-10-CM | POA: Insufficient documentation

## 2018-10-23 DIAGNOSIS — Z79899 Other long term (current) drug therapy: Secondary | ICD-10-CM | POA: Insufficient documentation

## 2018-10-23 DIAGNOSIS — R001 Bradycardia, unspecified: Secondary | ICD-10-CM | POA: Diagnosis not present

## 2018-10-23 DIAGNOSIS — Z7982 Long term (current) use of aspirin: Secondary | ICD-10-CM | POA: Diagnosis not present

## 2018-10-23 DIAGNOSIS — I4510 Unspecified right bundle-branch block: Secondary | ICD-10-CM | POA: Diagnosis not present

## 2018-10-23 DIAGNOSIS — G5762 Lesion of plantar nerve, left lower limb: Secondary | ICD-10-CM | POA: Insufficient documentation

## 2018-10-23 DIAGNOSIS — H409 Unspecified glaucoma: Secondary | ICD-10-CM | POA: Insufficient documentation

## 2018-10-23 DIAGNOSIS — Z86010 Personal history of colonic polyps: Secondary | ICD-10-CM | POA: Diagnosis not present

## 2018-10-23 DIAGNOSIS — K642 Third degree hemorrhoids: Secondary | ICD-10-CM | POA: Diagnosis not present

## 2018-10-23 DIAGNOSIS — I2510 Atherosclerotic heart disease of native coronary artery without angina pectoris: Secondary | ICD-10-CM | POA: Diagnosis not present

## 2018-10-23 DIAGNOSIS — M1711 Unilateral primary osteoarthritis, right knee: Secondary | ICD-10-CM | POA: Diagnosis not present

## 2018-10-23 HISTORY — DX: Unspecified glaucoma: H40.9

## 2018-10-23 HISTORY — DX: Lesion of plantar nerve, left lower limb: G57.62

## 2018-10-23 HISTORY — DX: Occlusion and stenosis of unspecified carotid artery: I65.29

## 2018-10-23 HISTORY — DX: Atherosclerotic heart disease of native coronary artery without angina pectoris: I25.10

## 2018-10-23 HISTORY — DX: Unspecified osteoarthritis, unspecified site: M19.90

## 2018-10-23 HISTORY — PX: EXCISION MORTON'S NEUROMA: SHX5013

## 2018-10-23 HISTORY — DX: Unspecified right bundle-branch block: I45.10

## 2018-10-23 SURGERY — EXCISION, MORTON'S NEUROMA
Anesthesia: Monitor Anesthesia Care | Site: Foot | Laterality: Left

## 2018-10-23 MED ORDER — CEFAZOLIN SODIUM-DEXTROSE 2-4 GM/100ML-% IV SOLN
INTRAVENOUS | Status: AC
  Filled 2018-10-23: qty 100

## 2018-10-23 MED ORDER — PROPOFOL 500 MG/50ML IV EMUL
INTRAVENOUS | Status: DC | PRN
  Administered 2018-10-23: 50 ug/kg/min via INTRAVENOUS

## 2018-10-23 MED ORDER — LACTATED RINGERS IV SOLN
INTRAVENOUS | Status: DC
  Administered 2018-10-23: 1000 mL via INTRAVENOUS
  Filled 2018-10-23: qty 1000

## 2018-10-23 MED ORDER — ACETAMINOPHEN 325 MG PO TABS
325.0000 mg | ORAL_TABLET | ORAL | Status: DC | PRN
  Filled 2018-10-23: qty 2

## 2018-10-23 MED ORDER — SODIUM CHLORIDE 0.9% FLUSH
3.0000 mL | INTRAVENOUS | Status: DC | PRN
  Filled 2018-10-23: qty 3

## 2018-10-23 MED ORDER — LIDOCAINE HCL 2 % IJ SOLN
INTRAMUSCULAR | Status: DC | PRN
  Administered 2018-10-23: 7.5 mL

## 2018-10-23 MED ORDER — SODIUM CHLORIDE 0.9 % IV SOLN
250.0000 mL | INTRAVENOUS | Status: DC | PRN
  Filled 2018-10-23: qty 250

## 2018-10-23 MED ORDER — FENTANYL CITRATE (PF) 100 MCG/2ML IJ SOLN
25.0000 ug | INTRAMUSCULAR | Status: DC | PRN
  Filled 2018-10-23: qty 1

## 2018-10-23 MED ORDER — LIDOCAINE 2% (20 MG/ML) 5 ML SYRINGE
INTRAMUSCULAR | Status: AC
  Filled 2018-10-23: qty 5

## 2018-10-23 MED ORDER — MEPERIDINE HCL 25 MG/ML IJ SOLN
6.2500 mg | INTRAMUSCULAR | Status: DC | PRN
  Filled 2018-10-23: qty 1

## 2018-10-23 MED ORDER — CEFAZOLIN SODIUM-DEXTROSE 2-4 GM/100ML-% IV SOLN
2.0000 g | INTRAVENOUS | Status: AC
  Administered 2018-10-23: 11:00:00 2 g via INTRAVENOUS
  Filled 2018-10-23: qty 100

## 2018-10-23 MED ORDER — EPHEDRINE SULFATE 50 MG/ML IJ SOLN
INTRAMUSCULAR | Status: DC | PRN
  Administered 2018-10-23 (×2): 10 mg via INTRAVENOUS

## 2018-10-23 MED ORDER — FENTANYL CITRATE (PF) 100 MCG/2ML IJ SOLN
INTRAMUSCULAR | Status: AC
  Filled 2018-10-23: qty 2

## 2018-10-23 MED ORDER — BUPIVACAINE HCL 0.5 % IJ SOLN
INTRAMUSCULAR | Status: DC | PRN
  Administered 2018-10-23: 7.5 mL

## 2018-10-23 MED ORDER — DEXAMETHASONE SODIUM PHOSPHATE 10 MG/ML IJ SOLN
INTRAMUSCULAR | Status: AC
  Filled 2018-10-23: qty 1

## 2018-10-23 MED ORDER — ONDANSETRON HCL 4 MG/2ML IJ SOLN
4.0000 mg | Freq: Once | INTRAMUSCULAR | Status: DC | PRN
  Filled 2018-10-23: qty 2

## 2018-10-23 MED ORDER — GLYCOPYRROLATE 0.2 MG/ML IJ SOLN
INTRAMUSCULAR | Status: DC | PRN
  Administered 2018-10-23: 0.2 mg via INTRAVENOUS

## 2018-10-23 MED ORDER — OXYCODONE HCL 5 MG PO TABS
5.0000 mg | ORAL_TABLET | Freq: Once | ORAL | Status: DC | PRN
  Filled 2018-10-23: qty 1

## 2018-10-23 MED ORDER — OXYCODONE HCL 5 MG/5ML PO SOLN
5.0000 mg | Freq: Once | ORAL | Status: DC | PRN
  Filled 2018-10-23: qty 5

## 2018-10-23 MED ORDER — MIDAZOLAM HCL 2 MG/2ML IJ SOLN
INTRAMUSCULAR | Status: AC
  Filled 2018-10-23: qty 2

## 2018-10-23 MED ORDER — CHLORHEXIDINE GLUCONATE 4 % EX LIQD
1.0000 "application " | Freq: Once | CUTANEOUS | Status: DC
  Filled 2018-10-23: qty 15

## 2018-10-23 MED ORDER — PROPOFOL 10 MG/ML IV BOLUS
INTRAVENOUS | Status: AC
  Filled 2018-10-23: qty 20

## 2018-10-23 MED ORDER — DEXAMETHASONE SODIUM PHOSPHATE 10 MG/ML IJ SOLN
INTRAMUSCULAR | Status: DC | PRN
  Administered 2018-10-23: 5 mg via INTRAVENOUS

## 2018-10-23 MED ORDER — MIDAZOLAM HCL 5 MG/5ML IJ SOLN
INTRAMUSCULAR | Status: DC | PRN
  Administered 2018-10-23 (×2): 1 mg via INTRAVENOUS

## 2018-10-23 MED ORDER — GLYCOPYRROLATE PF 0.2 MG/ML IJ SOSY
PREFILLED_SYRINGE | INTRAMUSCULAR | Status: AC
  Filled 2018-10-23: qty 1

## 2018-10-23 MED ORDER — PROPOFOL 500 MG/50ML IV EMUL
INTRAVENOUS | Status: AC
  Filled 2018-10-23: qty 50

## 2018-10-23 MED ORDER — FENTANYL CITRATE (PF) 100 MCG/2ML IJ SOLN
INTRAMUSCULAR | Status: DC | PRN
  Administered 2018-10-23 (×4): 25 ug via INTRAVENOUS

## 2018-10-23 MED ORDER — DEXAMETHASONE SODIUM PHOSPHATE 4 MG/ML IJ SOLN
INTRAMUSCULAR | Status: DC | PRN
  Administered 2018-10-23: 5 mg via INTRAVENOUS

## 2018-10-23 MED ORDER — ONDANSETRON HCL 4 MG/2ML IJ SOLN
INTRAMUSCULAR | Status: AC
  Filled 2018-10-23: qty 2

## 2018-10-23 MED ORDER — ACETAMINOPHEN 160 MG/5ML PO SOLN
325.0000 mg | ORAL | Status: DC | PRN
  Filled 2018-10-23: qty 20.3

## 2018-10-23 MED ORDER — ACETAMINOPHEN 325 MG PO TABS
650.0000 mg | ORAL_TABLET | ORAL | Status: DC | PRN
  Filled 2018-10-23: qty 2

## 2018-10-23 MED ORDER — LIDOCAINE HCL (CARDIAC) PF 100 MG/5ML IV SOSY
PREFILLED_SYRINGE | INTRAVENOUS | Status: DC | PRN
  Administered 2018-10-23: 60 mg via INTRAVENOUS

## 2018-10-23 MED ORDER — ONDANSETRON HCL 4 MG/2ML IJ SOLN
INTRAMUSCULAR | Status: DC | PRN
  Administered 2018-10-23: 4 mg via INTRAVENOUS

## 2018-10-23 MED ORDER — PROMETHAZINE HCL 12.5 MG PO TABS
12.5000 mg | ORAL_TABLET | ORAL | Status: DC | PRN
  Filled 2018-10-23: qty 1

## 2018-10-23 SURGICAL SUPPLY — 58 items
APL SKNCLS STERI-STRIP NONHPOA (GAUZE/BANDAGES/DRESSINGS) ×1
BANDAGE CO FLEX L/F 2IN X 5YD (GAUZE/BANDAGES/DRESSINGS) ×2 IMPLANT
BANDAGE ELASTIC 6 VELCRO ST LF (GAUZE/BANDAGES/DRESSINGS) IMPLANT
BENZOIN TINCTURE PRP APPL 2/3 (GAUZE/BANDAGES/DRESSINGS) ×2 IMPLANT
BLADE AVERAGE 25X9 (BLADE) IMPLANT
BLADE OSC/SAG .038X5.5 CUT EDG (BLADE) IMPLANT
BLADE OSC/SAGITTAL MD 5.5X18 (BLADE) IMPLANT
BLADE OSCIL/SAGITTAL W/10 ST (BLADE) IMPLANT
BLADE SURG 15 STRL LF DISP TIS (BLADE) ×3 IMPLANT
BLADE SURG 15 STRL SS (BLADE) ×6
BNDG CMPR 9X4 STRL LF SNTH (GAUZE/BANDAGES/DRESSINGS) ×1
BNDG COHESIVE 2X5 TAN STRL LF (GAUZE/BANDAGES/DRESSINGS) ×1 IMPLANT
BNDG CONFORM 2 STRL LF (GAUZE/BANDAGES/DRESSINGS) ×2 IMPLANT
BNDG ESMARK 4X9 LF (GAUZE/BANDAGES/DRESSINGS) ×2 IMPLANT
CORD BIPOLAR FORCEPS 12FT (ELECTRODE) ×1 IMPLANT
COVER BACK TABLE 60X90IN (DRAPES) ×2 IMPLANT
COVER MAYO STAND STRL (DRAPES) ×2 IMPLANT
COVER WAND RF STERILE (DRAPES) ×2 IMPLANT
DRAPE EXTREMITY T 121X128X90 (DISPOSABLE) ×2 IMPLANT
DRAPE SHEET LG 3/4 BI-LAMINATE (DRAPES) ×2 IMPLANT
DURAPREP 26ML APPLICATOR (WOUND CARE) ×2 IMPLANT
ELECT REM PT RETURN 9FT ADLT (ELECTROSURGICAL) ×2
ELECTRODE REM PT RTRN 9FT ADLT (ELECTROSURGICAL) ×1 IMPLANT
GAUZE SPONGE 4X4 12PLY STRL (GAUZE/BANDAGES/DRESSINGS) ×3 IMPLANT
GAUZE XEROFORM 1X8 LF (GAUZE/BANDAGES/DRESSINGS) ×2 IMPLANT
GLOVE BIO SURGEON STRL SZ7.5 (GLOVE) ×2 IMPLANT
GLOVE INDICATOR 7.5 STRL GRN (GLOVE) ×2 IMPLANT
GOWN STRL REUS W/TWL LRG LVL3 (GOWN DISPOSABLE) ×2 IMPLANT
KIT TURNOVER CYSTO (KITS) ×2 IMPLANT
LOOP VESSEL MAXI BLUE (MISCELLANEOUS) IMPLANT
NDL SAFETY ECLIPSE 18X1.5 (NEEDLE) ×1 IMPLANT
NEEDLE 27GAX1X1/2 (NEEDLE) ×2 IMPLANT
NEEDLE HYPO 18GX1.5 SHARP (NEEDLE) ×2
NEEDLE HYPO 22GX1.5 SAFETY (NEEDLE) ×2 IMPLANT
NS IRRIG 500ML POUR BTL (IV SOLUTION) ×2 IMPLANT
PACK BASIN DAY SURGERY FS (CUSTOM PROCEDURE TRAY) ×2 IMPLANT
PADDING CAST ABS 4INX4YD NS (CAST SUPPLIES)
PADDING CAST ABS COTTON 4X4 ST (CAST SUPPLIES) ×1 IMPLANT
PENCIL BUTTON HOLSTER BLD 10FT (ELECTRODE) ×2 IMPLANT
SPONGE LAP 4X18 RFD (DISPOSABLE) ×1 IMPLANT
STOCKINETTE 6  STRL (DRAPES) ×1
STOCKINETTE 6 STRL (DRAPES) ×1 IMPLANT
STRIP CLOSURE SKIN 1/2X4 (GAUZE/BANDAGES/DRESSINGS) ×1 IMPLANT
STRIP CLOSURE SKIN 1/4X4 (GAUZE/BANDAGES/DRESSINGS) ×1 IMPLANT
SUCTION FRAZIER HANDLE 10FR (MISCELLANEOUS) ×1
SUCTION TUBE FRAZIER 10FR DISP (MISCELLANEOUS) ×1 IMPLANT
SUT ETHILON 4 0 PS 2 18 (SUTURE) IMPLANT
SUT ETHILON 5 0 PS 2 18 (SUTURE) IMPLANT
SUT MNCRL AB 4-0 PS2 18 (SUTURE) ×1 IMPLANT
SUT VIC AB 3-0 SH 27 (SUTURE)
SUT VIC AB 3-0 SH 27X BRD (SUTURE) IMPLANT
SUT VICRYL 4-0 PS2 18IN ABS (SUTURE) ×1 IMPLANT
SYR 3ML 18GX1 1/2 (SYRINGE) ×2 IMPLANT
SYR BULB 3OZ (MISCELLANEOUS) ×2 IMPLANT
SYR CONTROL 10ML LL (SYRINGE) ×4 IMPLANT
TUBE CONNECTING 12X1/4 (SUCTIONS) ×2 IMPLANT
UNDERPAD 30X30 (UNDERPADS AND DIAPERS) ×4 IMPLANT
WATER STERILE IRR 500ML POUR (IV SOLUTION) ×2 IMPLANT

## 2018-10-23 NOTE — Discharge Instructions (Signed)
°  Post Anesthesia Home Care Instructions  Activity: Get plenty of rest for the remainder of the day. A responsible individual must stay with you for 24 hours following the procedure.  For the next 24 hours, DO NOT: -Drive a car -Paediatric nurse -Drink alcoholic beverages -Take any medication unless instructed by your physician -Make any legal decisions or sign important papers.  Meals: Start with liquid foods such as gelatin or soup. Progress to regular foods as tolerated. Avoid greasy, spicy, heavy foods. If nausea and/or vomiting occur, drink only clear liquids until the nausea and/or vomiting subsides. Call your physician if vomiting continues.  Special Instructions/Symptoms: Your throat may feel dry or sore from the anesthesia or the breathing tube placed in your throat during surgery. If this causes discomfort, gargle with warm salt water. The discomfort should disappear within 24 hours.  Regional Anesthesia Blocks  1. Numbness or the inability to move the "blocked" extremity may last from 3-48 hours after placement. The length of time depends on the medication injected and your individual response to the medication. If the numbness is not going away after 48 hours, call your surgeon.  2. The extremity that is blocked will need to be protected until the numbness is gone and the  Strength has returned. Because you cannot feel it, you will need to take extra care to avoid injury. Because it may be weak, you may have difficulty moving it or using it. You may not know what position it is in without looking at it while the block is in effect.  3. For blocks in the legs and feet, returning to weight bearing and walking needs to be done carefully. You will need to wait until the numbness is entirely gone and the strength has returned. You should be able to move your leg and foot normally before you try and bear weight or walk. You will need someone to be with you when you first try to ensure  you do not fall and possibly risk injury.  4. Bruising and tenderness at the needle site are common side effects and will resolve in a few days.  5. Persistent numbness or new problems with movement should be communicated to the surgeon [place] [phone]).    Call your surgeon if you experience:   1.  Fever over 101.0. 2.  Inability to urinate. 3.  Nausea and/or vomiting. 4.  Extreme swelling or bruising at the surgical site. 5.  Continued bleeding from the incision. 6.  Increased pain, redness or drainage from the incision. 7.  Problems related to your pain medication. 8.  Any problems and/or concerns 9. Any changes in color or sensation after the block has worn off. 10. Keep dressing dry until follow up appointment.. 11. Elevate foot for comfort, weight bearing as tolerated. 12. Sleep in the wooden shoe.

## 2018-10-23 NOTE — Anesthesia Procedure Notes (Signed)
Procedure Name: MAC Date/Time: 10/23/2018 10:32 AM Performed by: Justice Rocher, CRNA Pre-anesthesia Checklist: Patient identified, Emergency Drugs available, Suction available, Patient being monitored and Timeout performed Patient Re-evaluated:Patient Re-evaluated prior to induction Oxygen Delivery Method: Simple face mask Preoxygenation: Pre-oxygenation with 100% oxygen Induction Type: IV induction Placement Confirmation: positive ETCO2 and breath sounds checked- equal and bilateral

## 2018-10-23 NOTE — Anesthesia Postprocedure Evaluation (Signed)
Anesthesia Post Note  Patient: Andrew Payne  Procedure(s) Performed: EXCISION MORTON'S NEUROMA LEFT FOOT (Left Foot)     Patient location during evaluation: PACU Anesthesia Type: Regional Level of consciousness: awake and alert Pain management: pain level controlled Vital Signs Assessment: post-procedure vital signs reviewed and stable Respiratory status: spontaneous breathing, nonlabored ventilation, respiratory function stable and patient connected to nasal cannula oxygen Cardiovascular status: stable and blood pressure returned to baseline Postop Assessment: no apparent nausea or vomiting Anesthetic complications: no    Last Vitals:  Vitals:   10/23/18 1230 10/23/18 1257  BP: (!) 143/85 (!) 154/82  Pulse: (!) 42 (!) 40  Resp: 15 16  Temp:    SpO2: 96% 95%    Last Pain:  Vitals:   10/23/18 1257  TempSrc:   PainSc: 0-No pain                 Teagan Ozawa

## 2018-10-23 NOTE — Progress Notes (Signed)
The patient presents today with no changes to medical history and H&P reviewed with no changes.  Covid-19 test reviewed and is negative. He wants to proceed with surgery to excise 3rd left interspace neuroma. No guarantees given. He has postop instructions and appointment

## 2018-10-23 NOTE — Transfer of Care (Signed)
Immediate Anesthesia Transfer of Care Note  Patient: Andrew Payne  Procedure(s) Performed: Procedure(s) (LRB): EXCISION MORTON'S NEUROMA LEFT FOOT (Left)  Patient Location: PACU  Anesthesia Type: General  Level of Consciousness: awake, sedated, patient cooperative and responds to stimulation  Airway & Oxygen Therapy: Patient Spontanous Breathing and Patient connected to face mask oxygen with soft Face mask   Post-op Assessment: Report given to PACU RN, Post -op Vital signs reviewed and stable and Patient moving all extremities  Post vital signs: Reviewed and stable  Complications: No apparent anesthesia complications

## 2018-10-24 ENCOUNTER — Encounter (HOSPITAL_BASED_OUTPATIENT_CLINIC_OR_DEPARTMENT_OTHER): Payer: Self-pay | Admitting: Podiatry

## 2018-10-24 NOTE — Op Note (Signed)
Andrew Payne, Andrew Payne MEDICAL RECORD [id number] ACCOUNT [id number] DATE OF BIRTH:June 06, 1945 FACILITY: [facility] LOCATION: [facility] PHYSICIAN:Vista Sawatzky, DPM  OPERATIVE REPORT  DATE OF PROCEDURE:  10/23/2018  SURGEON:  Twanna Hy.  Sai Zinn, DPM.  ASSISTANT:  None.  PREOPERATIVE DIAGNOSIS:  Neuroma, third interspace, left foot.  POSTOPERATIVE DIAGNOSIS:  Neuroma, third interspace, left foot.  PROCEDURE:  Excision neuroma, third interspace, left foot.  HEMOSTASIS:  Pneumatic ankle tourniquet inflated to 250 mmHg.  ANESTHESIA:  MAC with local.  DESCRIPTION OF PROCEDURE:  The patient was brought to the OR and placed in the supine position, at which time monitored anesthesia care was administered.   A local block was performed with 15 mL of a 1:1 mixture of 0.5% Marcaine plain and 2% lidocaine  plain.  A well-padded pneumatic ankle tourniquet was applied superior to the medial malleolus.  The patient was prepped and draped in the usual aseptic manner.  The foot was exsanguinated with an Esmarch bandage and previously applied tourniquet inflated  to 250 mmHg.  PROCEDURE:  Excision neuroma, third interspace, left foot.  Attention was directed to the third interspace where a linear incision was made.  The incision was deepened via sharp and blunt modalities to the level of the deep transverse and metatarsal  ligament, which was then released.  Dissection continued to identify the distal most branches of the neuroma, which were traced and incised at its most distal branches.  The neuroma was then traced proximally as it dove deep plantar to the metatarsal  heads and transected at this level.  This was sent to pathology for evaluation throughout the case.  Great care was taken to clamp and cauterize all bleeding vessels and ensuring retraction of all neurovascular structures.  I encountered the incision was  irrigated with copious amounts of sterile saline and antibiotic solution.  Deep closure  was accomplished with 4-0 Vicryl and skin closure using 4-0 Monocryl.  The foot was dressed with Xeroform, 4 x 4's, Kling and Coban.  Tourniquet was deflated and  vascular status returned to all digits.    The patient was sent to the recovery room with vital signs stable and capillary refill time at presurgical levels to all toes.    Both written and oral postoperative instructions were given to the patient.  No guarantees given.  A postoperative shoe was dispensed.  The patient is instructed to contact the office immediately if there are any problems and to follow up for care  postoperatively.  AN/NUANCE  D:10/23/2018 T:10/24/2018 JOB:[id number]

## 2019-01-04 DIAGNOSIS — H401112 Primary open-angle glaucoma, right eye, moderate stage: Secondary | ICD-10-CM | POA: Diagnosis not present

## 2019-01-04 DIAGNOSIS — H40011 Open angle with borderline findings, low risk, right eye: Secondary | ICD-10-CM | POA: Diagnosis not present

## 2019-01-04 DIAGNOSIS — H5340 Unspecified visual field defects: Secondary | ICD-10-CM | POA: Diagnosis not present

## 2019-01-04 DIAGNOSIS — H524 Presbyopia: Secondary | ICD-10-CM | POA: Diagnosis not present

## 2019-01-04 DIAGNOSIS — H25813 Combined forms of age-related cataract, bilateral: Secondary | ICD-10-CM | POA: Diagnosis not present

## 2019-01-04 DIAGNOSIS — H5212 Myopia, left eye: Secondary | ICD-10-CM | POA: Diagnosis not present

## 2019-01-04 DIAGNOSIS — H52221 Regular astigmatism, right eye: Secondary | ICD-10-CM | POA: Diagnosis not present

## 2019-01-06 DIAGNOSIS — Z23 Encounter for immunization: Secondary | ICD-10-CM | POA: Diagnosis not present

## 2019-01-11 ENCOUNTER — Other Ambulatory Visit: Payer: Self-pay

## 2019-01-11 DIAGNOSIS — Z20822 Contact with and (suspected) exposure to covid-19: Secondary | ICD-10-CM

## 2019-01-11 DIAGNOSIS — R6889 Other general symptoms and signs: Secondary | ICD-10-CM | POA: Diagnosis not present

## 2019-01-13 LAB — NOVEL CORONAVIRUS, NAA: SARS-CoV-2, NAA: NOT DETECTED

## 2019-01-23 DIAGNOSIS — G5762 Lesion of plantar nerve, left lower limb: Secondary | ICD-10-CM | POA: Diagnosis not present

## 2019-01-23 DIAGNOSIS — M7742 Metatarsalgia, left foot: Secondary | ICD-10-CM | POA: Diagnosis not present

## 2019-02-13 DIAGNOSIS — G5762 Lesion of plantar nerve, left lower limb: Secondary | ICD-10-CM | POA: Diagnosis not present

## 2019-03-15 DIAGNOSIS — G5762 Lesion of plantar nerve, left lower limb: Secondary | ICD-10-CM | POA: Diagnosis not present

## 2019-05-03 DIAGNOSIS — R82998 Other abnormal findings in urine: Secondary | ICD-10-CM | POA: Diagnosis not present

## 2019-05-03 DIAGNOSIS — G5762 Lesion of plantar nerve, left lower limb: Secondary | ICD-10-CM | POA: Diagnosis not present

## 2019-05-03 DIAGNOSIS — E7849 Other hyperlipidemia: Secondary | ICD-10-CM | POA: Diagnosis not present

## 2019-05-11 DIAGNOSIS — G5762 Lesion of plantar nerve, left lower limb: Secondary | ICD-10-CM | POA: Diagnosis not present

## 2019-05-11 DIAGNOSIS — M25572 Pain in left ankle and joints of left foot: Secondary | ICD-10-CM | POA: Diagnosis not present

## 2019-05-16 ENCOUNTER — Ambulatory Visit: Payer: PPO | Attending: Internal Medicine

## 2019-05-16 DIAGNOSIS — Z23 Encounter for immunization: Secondary | ICD-10-CM

## 2019-05-16 NOTE — Progress Notes (Signed)
   Covid-19 Vaccination Clinic  Name:  Andrew Payne    MRN: [id number] DOB: 04-07-1946  05/16/2019  Andrew Payne was observed post Covid-19 immunization for 15 minutes without incidence. He was provided with Vaccine Information Sheet and instruction to access the V-Safe system.   Andrew Payne was instructed to call 911 with any severe reactions post vaccine: Marland Kitchen Difficulty breathing  . Swelling of your face and throat  . A fast heartbeat  . A bad rash all over your body  . Dizziness and weakness    Immunizations Administered    Name Date Dose VIS Date Route   Pfizer COVID-19 Vaccine 05/16/2019 10:36 AM 0.3 mL 04/06/2019 Intramuscular   Manufacturer: [place]   Lot: [id number]   [place]: [id number]

## 2019-06-06 ENCOUNTER — Ambulatory Visit: Payer: PPO | Attending: Internal Medicine

## 2019-06-06 DIAGNOSIS — Z23 Encounter for immunization: Secondary | ICD-10-CM

## 2019-06-06 NOTE — Progress Notes (Signed)
   Covid-19 Vaccination Clinic  Name:  Andrew Payne    MRN: [id number] DOB: 04-26-1946  06/06/2019  [name] was observed post Covid-19 immunization for 15 minutes without incidence. He was provided with Vaccine Information Sheet and instruction to access the V-Safe system.   [name] was instructed to call 911 with any severe reactions post vaccine: Marland Kitchen Difficulty breathing  . Swelling of your face and throat  . A fast heartbeat  . A bad rash all over your body  . Dizziness and weakness    Immunizations Administered    Name Date Dose VIS Date Route   Pfizer COVID-19 Vaccine 06/06/2019 11:40 AM 0.3 mL 04/06/2019 Intramuscular   Manufacturer: [employer]   Lot: [id number]   [place]: [id number]

## 2019-06-18 ENCOUNTER — Encounter: Payer: Self-pay | Admitting: Internal Medicine

## 2019-06-19 ENCOUNTER — Ambulatory Visit: Payer: PPO

## 2019-06-22 DIAGNOSIS — L570 Actinic keratosis: Secondary | ICD-10-CM | POA: Diagnosis not present

## 2019-06-22 DIAGNOSIS — L820 Inflamed seborrheic keratosis: Secondary | ICD-10-CM | POA: Diagnosis not present

## 2019-06-22 DIAGNOSIS — L821 Other seborrheic keratosis: Secondary | ICD-10-CM | POA: Diagnosis not present

## 2019-07-04 DIAGNOSIS — H401131 Primary open-angle glaucoma, bilateral, mild stage: Secondary | ICD-10-CM | POA: Diagnosis not present

## 2019-07-04 DIAGNOSIS — H401111 Primary open-angle glaucoma, right eye, mild stage: Secondary | ICD-10-CM | POA: Diagnosis not present

## 2019-07-17 ENCOUNTER — Ambulatory Visit (AMBULATORY_SURGERY_CENTER): Payer: Self-pay

## 2019-07-17 ENCOUNTER — Other Ambulatory Visit: Payer: Self-pay

## 2019-07-17 VITALS — Temp 96.8°F | Ht 73.0 in | Wt 186.6 lb

## 2019-07-17 DIAGNOSIS — Z86010 Personal history of colonic polyps: Secondary | ICD-10-CM

## 2019-07-17 NOTE — Progress Notes (Signed)
No egg or soy allergy known to patient  No issues with past sedation with any surgeries  or procedures, no intubation problems  No diet pills per patient No home 02 use per patient  No blood thinners per patient  Pt denies issues with constipation  No A fib or A flutter  EMMI video sent to pt's e mail   Pt has had both covid vaccines  Due to the COVID-19 pandemic we are asking patients to follow these guidelines. Please only bring one care partner. Please be aware that your care partner may wait in the car in the parking lot or if they feel like they will be too hot to wait in the car, they may wait in the lobby on the 4th floor. All care partners are required to wear a mask the entire time (we do not have any that we can provide them), they need to practice social distancing, and we will do a Covid check for all patient's and care partners when you arrive. Also we will check their temperature and your temperature. If the care partner waits in their car they need to stay in the parking lot the entire time and we will call them on their cell phone when the patient is ready for discharge so they can bring the car to the front of the building. Also all patient's will need to wear a mask into building.

## 2019-07-20 IMAGING — MR MR KNEE*R* W/O CM
6 series · 40 of 40 positions shown · non-contrast
Comparison: MRI 07/24/2015.

CLINICAL DATA: Progressive right knee pain for 6 months.
Posteromedial knee pain with walking. History of right knee surgery
in 9764.

EXAM:
MRI OF THE RIGHT KNEE WITHOUT CONTRAST
TECHNIQUE: Multiplanar, multisequence MR imaging of the knee was performed. No
intravenous contrast was administered.

[Series 5: T2 fat-sat · axial · 4.0mm · 0.62mm/px · z∈[-65,+60]mm · 6 of 26 slices shown (1 of 3)]
[im 1/26]
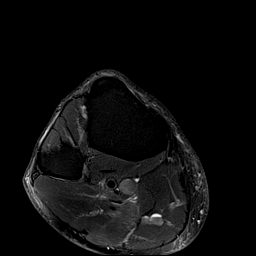
[im 6/26]
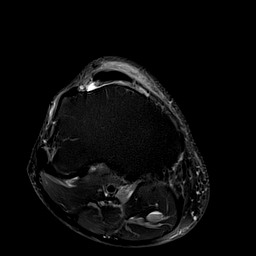
[im 11/26]
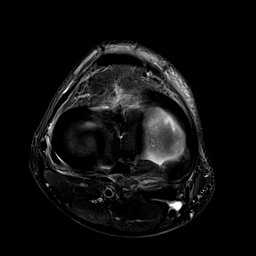
[im 16/26]
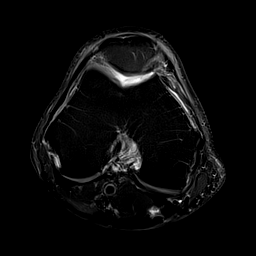
[im 21/26]
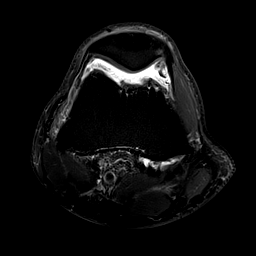
[im 26/26]
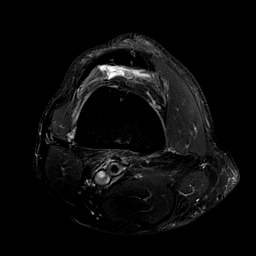

[Series 6: T1 · coronal · 4.0mm · 0.47mm/px · 6 of 26 slices shown]
[im 1/26]
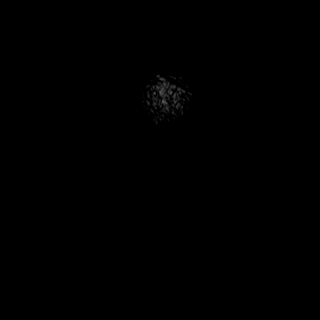
[im 6/26]
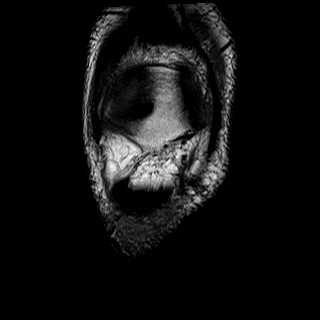
[im 11/26]
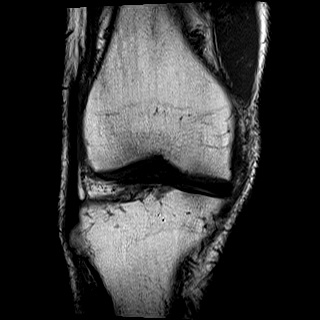
[im 16/26]
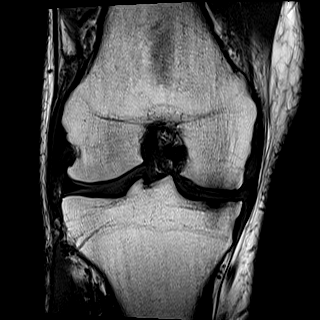
[im 21/26]
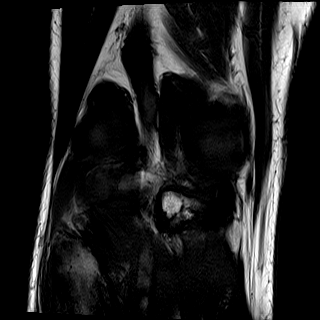
[im 26/26]
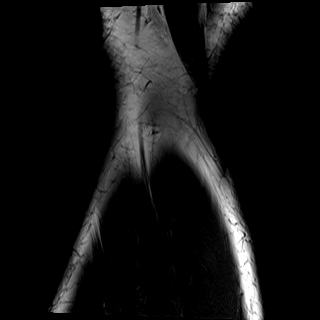

[Series 7: T2 fat-sat · coronal · 4.0mm · 0.59mm/px · 6 of 26 slices shown (2 of 3)]
[im 1/26]
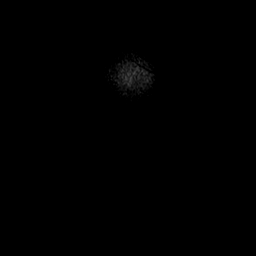
[im 6/26]
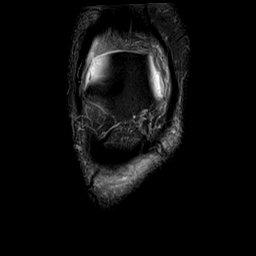
[im 11/26]
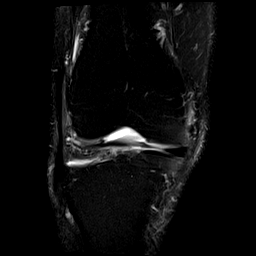
[im 16/26]
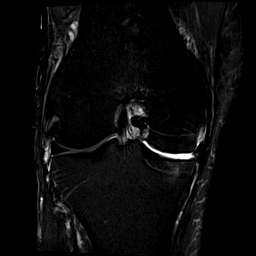
[im 21/26]
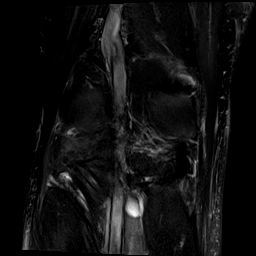
[im 26/26]
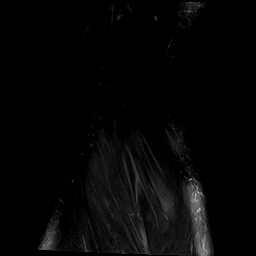

[Series 9: PD fat-sat · sagittal · 3.0mm · 0.59mm/px · 7 of 30 slices shown (1 of 2)]
[im 1/30]
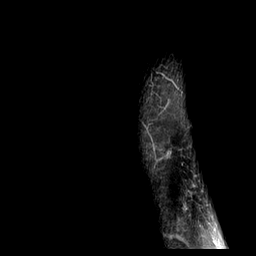
[im 5/30]
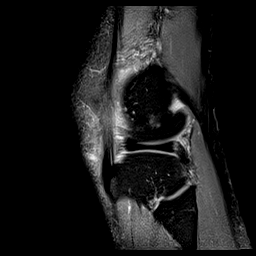
[im 10/30]
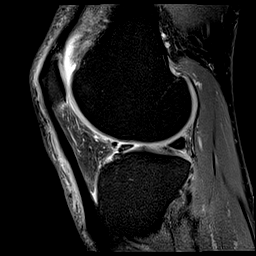
[im 15/30]
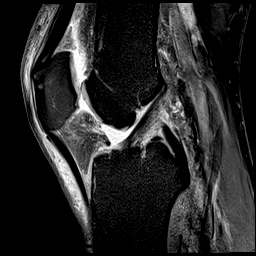
[im 20/30]
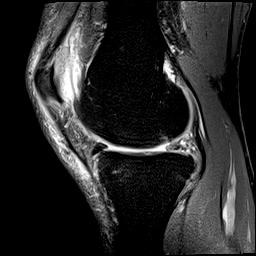
[im 25/30]
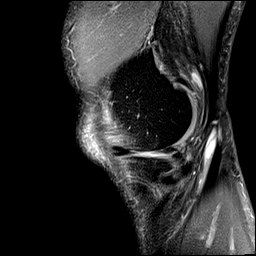
[im 30/30]
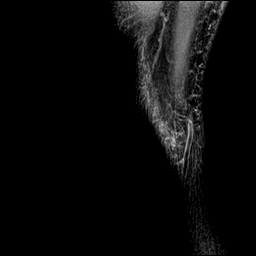

[Series 10: T2 fat-sat · sagittal · 3.0mm · 0.59mm/px · 7 of 30 slices shown (3 of 3)]
[im 1/30]
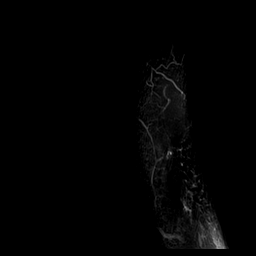
[im 5/30]
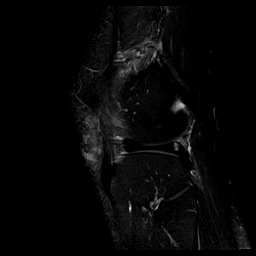
[im 10/30]
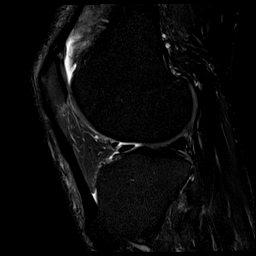
[im 15/30]
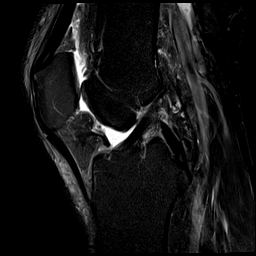
[im 20/30]
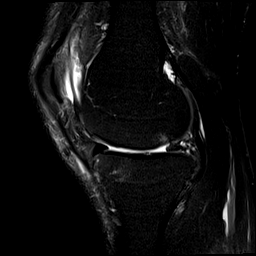
[im 25/30]
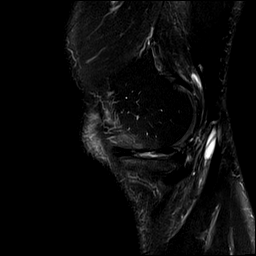
[im 30/30]
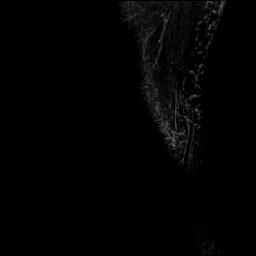

[Series 11: PD fat-sat · coronal · 3.0mm · 0.29mm/px · 8 of 32 slices shown (2 of 2)]
[im 1/32]
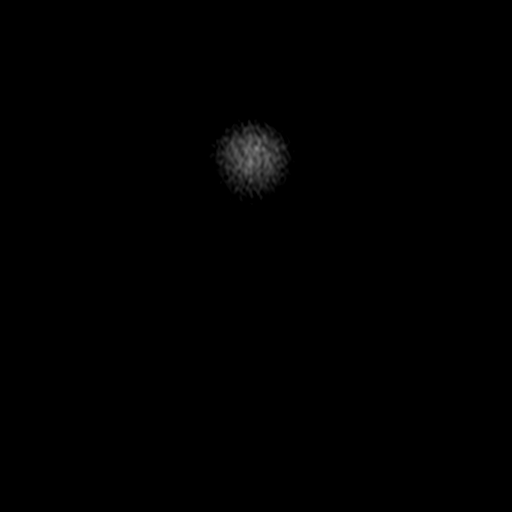
[im 5/32]
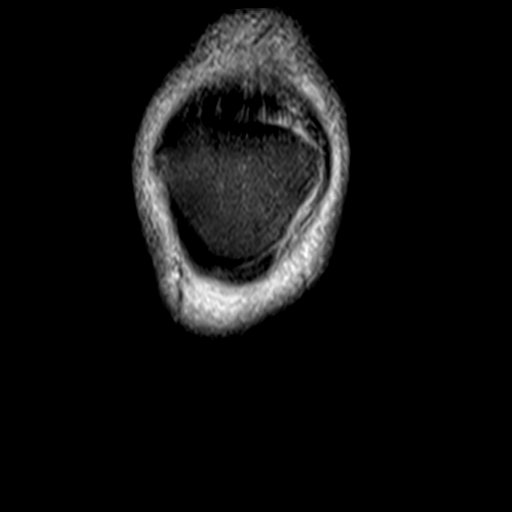
[im 9/32]
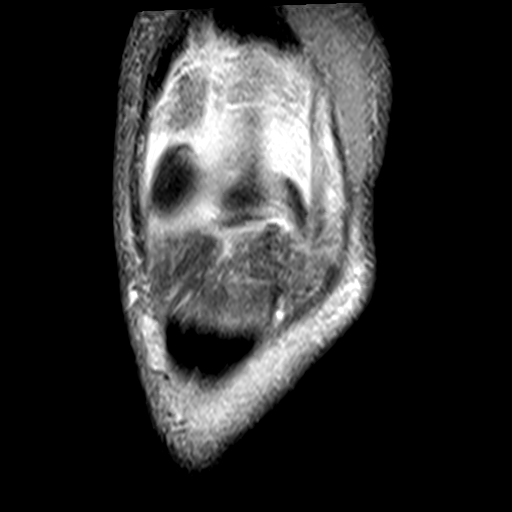
[im 14/32]
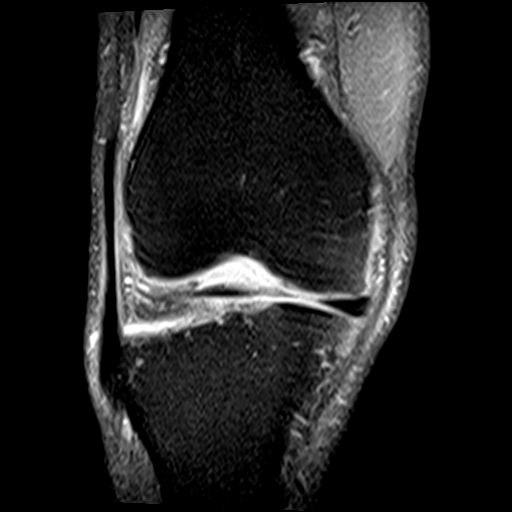
[im 18/32]
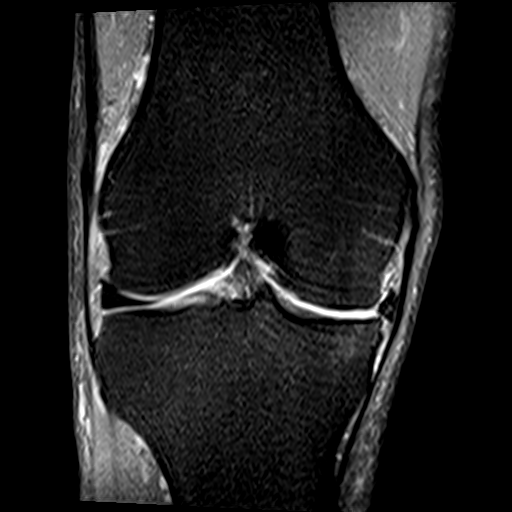
[im 23/32]
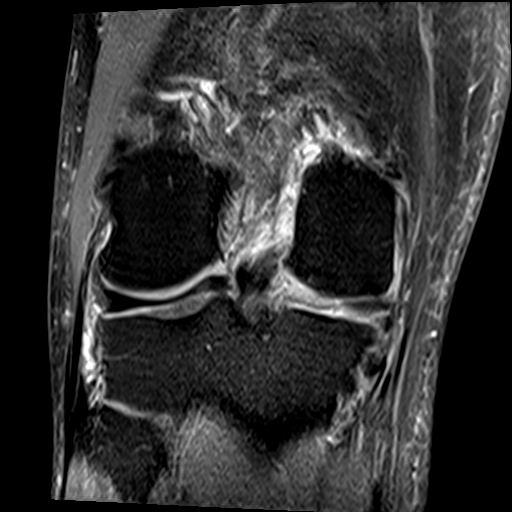
[im 27/32]
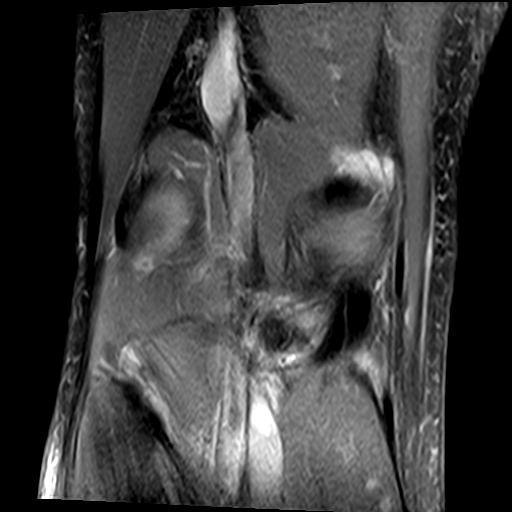
[im 32/32]
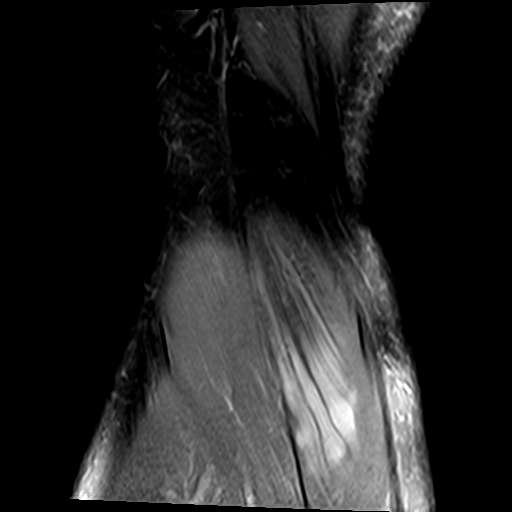

[40 of 40 positions shown; findings below may reference images not displayed]

FINDINGS: MENISCI

Medial meniscus: The posterior horn and body are diminutive with
irregular margins status post presumed interval partial
meniscectomy. Best seen on the coronal images is a suspected small
meniscal flap fragment on images [REDACTED]. No centrally
displaced meniscal fragment.

Lateral meniscus:  Intact with normal morphology.

LIGAMENTS

Cruciates:  Intact.

Collaterals:  Intact.

CARTILAGE

Patellofemoral: Stable mild patellofemoral degenerative changes with
chondral thinning and surface irregularity over the patellar apex
and medial facet.

Medial: Mildly progressive diffuse chondral thinning throughout the
medial compartment with associated subchondral edema and eburnation.

Lateral:  Preserved.

MISCELLANEOUS

Joint: Small joint effusion and mild synovial irregularity are
noted.

Popliteal Fossa:  There is a tiny septated Baker's cyst.

Extensor Mechanism:  Intact.

Bones:  No acute or significant extra-articular osseous findings.

Other: Mild postsurgical scarring in Hoffa's fat. There are mildly
thickened medial patellar and infrapatellar plica. There is mild
prepatellar subcutaneous edema.
IMPRESSION: 1. Presumed interval partial medial meniscectomy. The meniscal
remnant is diminutive with irregular margins, suspicious for a
recurrent degenerative tear. There is a possible small flap fragment
superiorly in the meniscotibial recess. No centrally displaced
meniscal fragment.
2. Progressive medial compartment degenerative chondrosis with
chondral thinning, subchondral edema and eburnation.
3. Stable mild patellofemoral degenerative changes.
4. Intact lateral meniscus, cruciate and collateral ligaments.

## 2019-07-30 ENCOUNTER — Encounter: Payer: Self-pay | Admitting: Internal Medicine

## 2019-07-30 DIAGNOSIS — M25572 Pain in left ankle and joints of left foot: Secondary | ICD-10-CM | POA: Diagnosis not present

## 2019-07-31 ENCOUNTER — Encounter: Payer: Self-pay | Admitting: Internal Medicine

## 2019-07-31 ENCOUNTER — Other Ambulatory Visit: Payer: Self-pay

## 2019-07-31 ENCOUNTER — Ambulatory Visit (AMBULATORY_SURGERY_CENTER): Payer: PPO | Admitting: Internal Medicine

## 2019-07-31 VITALS — BP 109/69 | HR 43 | Temp 95.1°F | Resp 17 | Ht 73.0 in | Wt 186.6 lb

## 2019-07-31 DIAGNOSIS — Z86010 Personal history of colonic polyps: Secondary | ICD-10-CM

## 2019-07-31 DIAGNOSIS — D124 Benign neoplasm of descending colon: Secondary | ICD-10-CM | POA: Diagnosis not present

## 2019-07-31 DIAGNOSIS — D123 Benign neoplasm of transverse colon: Secondary | ICD-10-CM

## 2019-07-31 DIAGNOSIS — D120 Benign neoplasm of cecum: Secondary | ICD-10-CM

## 2019-07-31 DIAGNOSIS — Z1211 Encounter for screening for malignant neoplasm of colon: Secondary | ICD-10-CM | POA: Diagnosis not present

## 2019-07-31 DIAGNOSIS — D122 Benign neoplasm of ascending colon: Secondary | ICD-10-CM | POA: Diagnosis not present

## 2019-07-31 MED ORDER — SODIUM CHLORIDE 0.9 % IV SOLN: 500.0000 mL | Freq: Once | INTRAVENOUS | Status: DC

## 2019-07-31 NOTE — Progress Notes (Signed)
Temperature taken by J.B., VS taken by D.T. 

## 2019-07-31 NOTE — Patient Instructions (Addendum)
I found and removed 4 tiny (all < 5 mm) polyps today. I will let you know pathology results and when to have another routine colonoscopy by mail and/or My Chart.   Still see diverticulosis.  Hemorrhoids are there as you know - colonoscopy not the best test but I do not see anything bad.  I appreciate the opportunity to care for you. Gatha Mayer, MD, [facility]  Please read handouts provided. Await pathology results. Continue present medications.    YOU HAD AN ENDOSCOPIC PROCEDURE TODAY AT [facility]:   Refer to the procedure report that was given to you for any specific questions about what was found during the examination.  If the procedure report does not answer your questions, please call your gastroenterologist to clarify.  If you requested that your care partner not be given the details of your procedure findings, then the procedure report has been included in a sealed envelope for you to review at your convenience later.  YOU SHOULD EXPECT: Some feelings of bloating in the abdomen. Passage of more gas than usual.  Walking can help get rid of the air that was put into your GI tract during the procedure and reduce the bloating. If you had a lower endoscopy (such as a colonoscopy or flexible sigmoidoscopy) you may notice spotting of blood in your stool or on the toilet paper. If you underwent a bowel prep for your procedure, you may not have a normal bowel movement for a few days.  Please Note:  You might notice some irritation and congestion in your nose or some drainage.  This is from the oxygen used during your procedure.  There is no need for concern and it should clear up in a day or so.  SYMPTOMS TO REPORT IMMEDIATELY:   Following lower endoscopy (colonoscopy or flexible sigmoidoscopy):  Excessive amounts of blood in the stool  Significant tenderness or worsening of abdominal pains  Swelling of the abdomen that is new, acute  Fever of 100F or  higher     For urgent or emergent issues, a gastroenterologist can be reached at any hour by calling [phone]. Do not use MyChart messaging for urgent concerns.    DIET:  We do recommend a small meal at first, but then you may proceed to your regular diet.  Drink plenty of fluids but you should avoid alcoholic beverages for 24 hours.  ACTIVITY:  You should plan to take it easy for the rest of today and you should NOT DRIVE or use heavy machinery until tomorrow (because of the sedation medicines used during the test).    FOLLOW UP: Our staff will call the number listed on your records 48-72 hours following your procedure to check on you and address any questions or concerns that you may have regarding the information given to you following your procedure. If we do not reach you, we will leave a message.  We will attempt to reach you two times.  During this call, we will ask if you have developed any symptoms of COVID 19. If you develop any symptoms (ie: fever, flu-like symptoms, shortness of breath, cough etc.) before then, please call [phone].  If you test positive for Covid 19 in the 2 weeks post procedure, please call and report this information to [place].    If any biopsies were taken you will be contacted by phone or by letter within the next 1-3 weeks.  Please call us at [phone] if you have  not heard about the biopsies in 3 weeks.    SIGNATURES/CONFIDENTIALITY: You and/or your care partner have signed paperwork which will be entered into your electronic medical record.  These signatures attest to the fact that that the information above on your After Visit Summary has been reviewed and is understood.  Full responsibility of the confidentiality of this discharge information lies with you and/or your care-partner.

## 2019-07-31 NOTE — Progress Notes (Signed)
To PACU, VSS. Report to [email or web address] 

## 2019-07-31 NOTE — Progress Notes (Signed)
Called to room to assist during endoscopic procedure.  Patient ID and intended procedure confirmed with present staff. Received instructions for my participation in the procedure from the performing physician.  

## 2019-07-31 NOTE — Op Note (Signed)
[place] Patient Name: Andrew Payne Procedure Date: 07/31/2019 1:30 PM MRN: [id number] Endoscopist: Gatha Mayer , MD Age: 74 Referring MD:  Date of Birth: 1946/04/22 Gender: Male Account #: [id number] Procedure:                Colonoscopy Indications:              Surveillance: Personal history of adenomatous                            polyps on last colonoscopy 3 years ago Medicines:                Propofol per Anesthesia, Monitored Anesthesia Care Procedure:                Pre-Anesthesia Assessment:                           - Prior to the procedure, a History and Physical                            was performed, and patient medications and                            allergies were reviewed. The patient's tolerance of                            previous anesthesia was also reviewed. The risks                            and benefits of the procedure and the sedation                            options and risks were discussed with the patient.                            All questions were answered, and informed consent                            was obtained. Prior Anticoagulants: The patient has                            taken no previous anticoagulant or antiplatelet                            agents. ASA Grade Assessment: II - A patient with                            mild systemic disease. After reviewing the risks                            and benefits, the patient was deemed in                            satisfactory condition to undergo the procedure.  After obtaining informed consent, the colonoscope                            was passed under direct vision. Throughout the                            procedure, the patient's blood pressure, pulse, and                            oxygen saturations were monitored continuously. The                            Colonoscope was introduced through the anus and   advanced to the the cecum, identified by                            appendiceal orifice and ileocecal valve. The                            colonoscopy was performed without difficulty. The                            patient tolerated the procedure well. The quality                            of the bowel preparation was good. The ileocecal                            valve, appendiceal orifice, and rectum were                            photographed. The bowel preparation used was                            Miralax via split dose instruction. Scope In: 1:41:15 PM Scope Out: 1:59:38 PM Scope Withdrawal Time: 0 hours 14 minutes 2 seconds  Total Procedure Duration: 0 hours 18 minutes 23 seconds  Findings:                 The perianal and digital rectal examinations were                            normal. Pertinent negatives include normal prostate                            (size, shape, and consistency).                           Two sessile polyps were found in the descending                            colon and transverse colon. The polyps were                            diminutive in size. These polyps were  removed with                            a cold snare. Resection and retrieval were                            complete. Verification of patient identification                            for the specimen was done. Estimated blood loss was                            minimal.                           Two sessile polyps were found in the ascending                            colon and cecum. The polyps were 1 mm in size.                            These polyps were removed with a cold biopsy                            forceps. Resection and retrieval were complete.                            Verification of patient identification for the                            specimen was done. Estimated blood loss was minimal.                           Multiple small and large-mouthed diverticula  were                            found in the left colon.                           External and internal hemorrhoids were found.                           A post hemorrhoid banding was found in the rectum.                           The exam was otherwise without abnormality on                            direct and retroflexion views. Complications:            No immediate complications. Estimated Blood Loss:     Estimated blood loss was minimal. Impression:               - Two diminutive polyps in the descending colon and  in the transverse colon, removed with a cold snare.                            Resected and retrieved.                           - Two 1 mm polyps in the ascending colon and in the                            cecum, removed with a cold biopsy forceps. Resected                            and retrieved.                           - Diverticulosis in the left colon.                           - External and internal hemorrhoids. (small) - He                            has had some sxs though much better after multiple                            bandings especially on LL                           - The examination was otherwise normal on direct                            and retroflexion views.                           - Personal history of colonic polyps. 7 adenomas                            max 12 mm 2018 Recommendation:           - Patient has a contact number available for                            emergencies. The signs and symptoms of potential                            delayed complications were discussed with the                            patient. Return to normal activities tomorrow.                            Written discharge instructions were provided to the                            patient.                           -  Resume previous diet.                           - Continue present medications.                           -  Repeat colonoscopy is recommended for                            surveillance. The colonoscopy date will be                            determined after pathology results from today's                            exam become available for review. Gatha Mayer, MD 07/31/2019 2:11:09 PM This report has been signed electronically.

## 2019-07-31 NOTE — Progress Notes (Signed)
Pt's states no medical or surgical changes since previsit or office visit. 

## 2019-08-02 ENCOUNTER — Telehealth: Payer: Self-pay

## 2019-08-02 ENCOUNTER — Telehealth: Payer: Self-pay | Admitting: *Deleted

## 2019-08-02 NOTE — Telephone Encounter (Signed)
Left message on follow up call. 

## 2019-08-02 NOTE — Telephone Encounter (Signed)
  Follow up Call-  Call back number 07/31/2019  Post procedure Call Back phone  # [phone]  Permission to leave phone message Yes  Some recent data might be hidden     Patient questions:  Do you have a fever, pain , or abdominal swelling? No. Pain Score  0 *  Have you tolerated food without any problems? Yes.    Have you been able to return to your normal activities? Yes.    Do you have any questions about your discharge instructions: Diet   No. Medications  No. Follow up visit  No.  Do you have questions or concerns about your Care? No.  Actions: * If pain score is 4 or above: No action needed, pain <4  1. Have you developed a fever since your procedure? NO  2.   Have you had an respiratory symptoms (SOB or cough) since your procedure? NO  3.   Have you tested positive for COVID 19 since your procedure NO  4.   Have you had any family members/close contacts diagnosed with the COVID 19 since your procedure?  NO   If yes to any of these questions please route to Joylene John, RN and Erenest Rasher, RN

## 2019-08-06 ENCOUNTER — Encounter: Payer: Self-pay | Admitting: Internal Medicine

## 2019-08-06 DIAGNOSIS — Z86010 Personal history of colonic polyps: Secondary | ICD-10-CM

## 2019-08-22 ENCOUNTER — Ambulatory Visit: Payer: PPO | Admitting: Orthopaedic Surgery

## 2019-08-22 DIAGNOSIS — R2689 Other abnormalities of gait and mobility: Secondary | ICD-10-CM | POA: Diagnosis not present

## 2019-08-22 DIAGNOSIS — M7742 Metatarsalgia, left foot: Secondary | ICD-10-CM | POA: Diagnosis not present

## 2019-08-22 DIAGNOSIS — G5762 Lesion of plantar nerve, left lower limb: Secondary | ICD-10-CM | POA: Diagnosis not present

## 2019-08-22 DIAGNOSIS — M205X1 Other deformities of toe(s) (acquired), right foot: Secondary | ICD-10-CM | POA: Diagnosis not present

## 2019-08-23 ENCOUNTER — Encounter: Payer: Self-pay | Admitting: Orthopaedic Surgery

## 2019-08-23 ENCOUNTER — Other Ambulatory Visit: Payer: Self-pay

## 2019-08-23 ENCOUNTER — Ambulatory Visit: Payer: PPO | Admitting: Orthopaedic Surgery

## 2019-08-23 ENCOUNTER — Ambulatory Visit (INDEPENDENT_AMBULATORY_CARE_PROVIDER_SITE_OTHER): Payer: PPO

## 2019-08-23 VITALS — Ht 74.0 in | Wt 183.0 lb

## 2019-08-23 DIAGNOSIS — M25512 Pain in left shoulder: Secondary | ICD-10-CM

## 2019-08-23 DIAGNOSIS — G8929 Other chronic pain: Secondary | ICD-10-CM | POA: Diagnosis not present

## 2019-08-23 DIAGNOSIS — M7542 Impingement syndrome of left shoulder: Secondary | ICD-10-CM

## 2019-08-23 MED ORDER — LIDOCAINE HCL 2 % IJ SOLN
2.0000 mL | INTRAMUSCULAR | Status: AC | PRN
  Administered 2019-08-23: 15:00:00 2 mL

## 2019-08-23 MED ORDER — BUPIVACAINE HCL 0.5 % IJ SOLN
2.0000 mL | INTRAMUSCULAR | Status: AC | PRN
  Administered 2019-08-23: 2 mL via INTRA_ARTICULAR

## 2019-08-23 MED ORDER — METHYLPREDNISOLONE ACETATE 40 MG/ML IJ SUSP
80.0000 mg | INTRAMUSCULAR | Status: AC | PRN
  Administered 2019-08-23: 15:00:00 80 mg via INTRA_ARTICULAR

## 2019-08-23 NOTE — Progress Notes (Signed)
Office Visit Note   Patient: Andrew Payne           Date of Birth: 1945/12/30           MRN: [id number] Visit Date: 08/23/2019              Requested by: Marton Redwood, MD [address] [place],  [place] [address] PCP: Marton Redwood, MD   Assessment & Plan: Visit Diagnoses:  1. Chronic left shoulder pain   2. Impingement syndrome of left shoulder     Plan: Impingement syndrome left shoulder with possible rotator cuff tear.  No loss of motion and good strength.  Will inject the subacromial space with cortisone and monitor response.  If no improvement over the next several weeks would consider an MRI scan.  Rosana Hoes had issues with that same shoulder in the past  Follow-Up Instructions: Return if symptoms worsen or fail to improve.   Orders:  Orders Placed This Encounter  Procedures  . Large Joint Inj: L subacromial bursa  . XR Shoulder Left   No orders of the defined types were placed in this encounter.     Procedures: Large Joint Inj: L subacromial bursa on 08/23/2019 3:03 PM Indications: pain and diagnostic evaluation Details: 25 G 1.5 in needle, anterolateral approach  Arthrogram: No  Medications: 2 mL lidocaine 2 %; 2 mL bupivacaine 0.5 %; 80 mg methylPREDNISolone acetate 40 MG/ML Consent was given by the patient. Immediately prior to procedure a time out was called to verify the correct patient, procedure, equipment, support staff and site/side marked as required. Patient was prepped and draped in the usual sterile fashion.       Clinical Data: No additional findings.   Subjective: Chief Complaint  Patient presents with  . Left Shoulder - Pain  Patient presents today for left shoulder pain that has been present for 6-8weeks. His pain is located laterally and superior. He said that it hurts with any movement. He has some numbness in his left arm, along with weakness. No tingling. He is right hand dominant. He does not take anything for pain. He was here over a  year ago for left shoulder pain and received a cortisone injection, but states that it did not help.   HPI  Review of Systems  Constitutional: Negative for fatigue.  HENT: Negative for ear pain.   Eyes: Negative for pain.  Respiratory: Negative for shortness of breath.   Cardiovascular: Negative for leg swelling.  Gastrointestinal: Negative for constipation and diarrhea.  Endocrine: Negative for cold intolerance and heat intolerance.  Genitourinary: Negative for difficulty urinating.  Musculoskeletal: Negative for joint swelling.  Skin: Negative for rash.  Allergic/Immunologic: Negative for food allergies.  Neurological: Positive for weakness.  Hematological: Does not bruise/bleed easily.  Psychiatric/Behavioral: Negative for sleep disturbance.     Objective: Vital Signs: Ht 6\' 2"  (1.88 m)   Wt 183 lb (83 kg)   BMI 23.50 kg/m   Physical Exam Constitutional:      Appearance: He is well-developed.  Eyes:     Pupils: Pupils are equal, round, and reactive to light.  Pulmonary:     Effort: Pulmonary effort is normal.  Skin:    General: Skin is warm and dry.  Neurological:     Mental Status: He is alert and oriented to person, place, and time.  Psychiatric:        Behavior: Behavior normal.     Ortho Exam awake alert and oriented x3.  Full overhead motion  of left shoulder with some mild pain.  Has positive impingement with abduction of the shoulder to about 80 degrees.  Minimally positive impingement and empty can testing.  Skin intact.  Biceps intact.  Negative Speed sign.  Good strength.  Neurologically intact  Specialty Comments:  No specialty comments available.  Imaging: XR Shoulder Left  Result Date: 08/23/2019 Films of the left shoulder obtained in several projections.  There might be a very small inferior humeral head spur medially.  Normal space between the humeral head and acromion.  Humeral head appears to be centered at the glenoid.  Degenerative changes the  AC joint.  No ectopic calcification.  No acute changes    PMFS History: Patient Active Problem List   Diagnosis Date Noted  . Impingement syndrome of left shoulder 08/23/2019  . Unilateral primary osteoarthritis, right knee 11/01/2017  . Chest pain 08/07/2017  . Bradycardia 08/07/2017  . History of right tennis elbow 05/09/2017  . Right tennis elbow 04/07/2017  . Prolapsed internal hemorrhoids, grade 3 06/03/2016  . Hx of adenomatous colonic polyps 05/27/2016   Past Medical History:  Diagnosis Date  . Bradycardia    cardiology-- dr Curt Bears--  work-up done included event monitor and echo (all in epic)  . Carotid artery stenosis    per pt H&P dated 10-20-2018 --  <40%  . Coronary atherosclerosis   . Glaucoma, right eye   . History of nuclear stress test 04/15/2005   (in epic)  for near syncope--- Low risk normal no ischemia, ef 57%  . Hx of adenomatous colonic polyps 05/27/2016  . Internal hemorrhoids   . Morton neuroma, left   . OA (osteoarthritis)    wrist, elbow, thumb, knees  . PVC's (premature ventricular contractions)   . RBBB (right bundle branch block)    States heart rate has been as low as 28 and asyptomatic    Family History  Problem Relation Age of Onset  . Heart disease Mother   . Alzheimer's disease Father   . Colon polyps Father   . Colon cancer Neg Hx   . Pancreatic cancer Neg Hx   . Prostate cancer Neg Hx   . Rectal cancer Neg Hx   . Esophageal cancer Neg Hx     Past Surgical History:  Procedure Laterality Date  . COLONOSCOPY  05/2016   TA  . ELBOW SURGERY Left 04/2017  . EXCISION MORTON'S NEUROMA Left 10/23/2018   Procedure: EXCISION MORTON'S NEUROMA LEFT FOOT;  Surgeon: Rosemary Holms, DPM;  Location: [facility];  Service: Podiatry;  Laterality: Left;  . HEMORRHOID BANDING  2018  . INGUINAL HERNIA REPAIR Left 06/14/2017   Procedure: LEFT INGUINAL HERNIA REPAIR ERAS PATHWAY;  Surgeon: Erroll Luna, MD;  Location: [facility];  Service: General;  Laterality: Left;  . INGUINAL HERNIA REPAIR Right 11-18-2009   dr Redmond Pulling  @[facility]   . INSERTION OF MESH Left 06/14/2017   Procedure: INSERTION OF MESH;  Surgeon: Erroll Luna, MD;  Location: [facility];  Service: General;  Laterality: Left;  . KNEE ARTHROSCOPY W/ MENISCAL REPAIR Right 09/2015  . POLYPECTOMY    . TONSILLECTOMY AND ADENOIDECTOMY  child   Social History   Occupational History  . Not on file  Tobacco Use  . Smoking status: Never Smoker  . Smokeless tobacco: Never Used  Substance and Sexual Activity  . Alcohol use: Not Currently  . Drug use: No  . Sexual activity: Not on file

## 2019-09-05 DIAGNOSIS — Z125 Encounter for screening for malignant neoplasm of prostate: Secondary | ICD-10-CM | POA: Diagnosis not present

## 2019-09-05 DIAGNOSIS — E7849 Other hyperlipidemia: Secondary | ICD-10-CM | POA: Diagnosis not present

## 2019-09-06 ENCOUNTER — Encounter: Payer: Self-pay | Admitting: Orthopaedic Surgery

## 2019-09-07 DIAGNOSIS — R82998 Other abnormal findings in urine: Secondary | ICD-10-CM | POA: Diagnosis not present

## 2019-09-12 DIAGNOSIS — Z1331 Encounter for screening for depression: Secondary | ICD-10-CM | POA: Diagnosis not present

## 2019-09-12 DIAGNOSIS — I2510 Atherosclerotic heart disease of native coronary artery without angina pectoris: Secondary | ICD-10-CM | POA: Diagnosis not present

## 2019-09-12 DIAGNOSIS — Z86010 Personal history of colonic polyps: Secondary | ICD-10-CM | POA: Diagnosis not present

## 2019-09-12 DIAGNOSIS — M79672 Pain in left foot: Secondary | ICD-10-CM | POA: Diagnosis not present

## 2019-09-12 DIAGNOSIS — R001 Bradycardia, unspecified: Secondary | ICD-10-CM | POA: Diagnosis not present

## 2019-09-12 DIAGNOSIS — Z0000 Encounter for general adult medical examination without abnormal findings: Secondary | ICD-10-CM | POA: Diagnosis not present

## 2019-09-12 DIAGNOSIS — E785 Hyperlipidemia, unspecified: Secondary | ICD-10-CM | POA: Diagnosis not present

## 2019-09-12 DIAGNOSIS — I6529 Occlusion and stenosis of unspecified carotid artery: Secondary | ICD-10-CM | POA: Diagnosis not present

## 2019-09-20 ENCOUNTER — Other Ambulatory Visit (HOSPITAL_COMMUNITY): Payer: Self-pay | Admitting: Internal Medicine

## 2019-09-20 ENCOUNTER — Other Ambulatory Visit: Payer: Self-pay

## 2019-09-20 ENCOUNTER — Ambulatory Visit (HOSPITAL_COMMUNITY)
Admission: RE | Admit: 2019-09-20 | Discharge: 2019-09-20 | Disposition: A | Discharge status: Home / Self Care | Payer: PPO | Source: Ambulatory Visit | Attending: Vascular Surgery | Admitting: Vascular Surgery

## 2019-09-20 DIAGNOSIS — I6529 Occlusion and stenosis of unspecified carotid artery: Secondary | ICD-10-CM

## 2019-10-10 DIAGNOSIS — M205X1 Other deformities of toe(s) (acquired), right foot: Secondary | ICD-10-CM | POA: Diagnosis not present

## 2019-10-10 DIAGNOSIS — M79672 Pain in left foot: Secondary | ICD-10-CM | POA: Diagnosis not present

## 2019-10-10 DIAGNOSIS — R2689 Other abnormalities of gait and mobility: Secondary | ICD-10-CM | POA: Diagnosis not present

## 2019-10-31 DIAGNOSIS — D1801 Hemangioma of skin and subcutaneous tissue: Secondary | ICD-10-CM | POA: Diagnosis not present

## 2019-10-31 DIAGNOSIS — D2272 Melanocytic nevi of left lower limb, including hip: Secondary | ICD-10-CM | POA: Diagnosis not present

## 2019-10-31 DIAGNOSIS — L821 Other seborrheic keratosis: Secondary | ICD-10-CM | POA: Diagnosis not present

## 2019-10-31 DIAGNOSIS — D225 Melanocytic nevi of trunk: Secondary | ICD-10-CM | POA: Diagnosis not present

## 2019-10-31 DIAGNOSIS — L820 Inflamed seborrheic keratosis: Secondary | ICD-10-CM | POA: Diagnosis not present

## 2019-10-31 DIAGNOSIS — L570 Actinic keratosis: Secondary | ICD-10-CM | POA: Diagnosis not present

## 2019-11-14 DIAGNOSIS — M205X1 Other deformities of toe(s) (acquired), right foot: Secondary | ICD-10-CM | POA: Diagnosis not present

## 2019-11-14 DIAGNOSIS — M7742 Metatarsalgia, left foot: Secondary | ICD-10-CM | POA: Diagnosis not present

## 2019-11-14 DIAGNOSIS — G5762 Lesion of plantar nerve, left lower limb: Secondary | ICD-10-CM | POA: Diagnosis not present

## 2019-11-14 DIAGNOSIS — R2689 Other abnormalities of gait and mobility: Secondary | ICD-10-CM | POA: Diagnosis not present

## 2019-11-27 DIAGNOSIS — M79672 Pain in left foot: Secondary | ICD-10-CM | POA: Diagnosis not present

## 2019-11-27 DIAGNOSIS — M216X2 Other acquired deformities of left foot: Secondary | ICD-10-CM | POA: Diagnosis not present

## 2019-11-27 DIAGNOSIS — M659 Synovitis and tenosynovitis, unspecified: Secondary | ICD-10-CM | POA: Diagnosis not present

## 2019-12-24 DIAGNOSIS — Z20822 Contact with and (suspected) exposure to covid-19: Secondary | ICD-10-CM | POA: Diagnosis not present

## 2020-01-01 DIAGNOSIS — M216X2 Other acquired deformities of left foot: Secondary | ICD-10-CM | POA: Diagnosis not present

## 2020-01-01 DIAGNOSIS — M65172 Other infective (teno)synovitis, left ankle and foot: Secondary | ICD-10-CM | POA: Diagnosis not present

## 2020-01-01 DIAGNOSIS — M7742 Metatarsalgia, left foot: Secondary | ICD-10-CM | POA: Diagnosis not present

## 2020-01-08 DIAGNOSIS — H524 Presbyopia: Secondary | ICD-10-CM | POA: Diagnosis not present

## 2020-01-08 DIAGNOSIS — E119 Type 2 diabetes mellitus without complications: Secondary | ICD-10-CM | POA: Diagnosis not present

## 2020-01-08 DIAGNOSIS — H5212 Myopia, left eye: Secondary | ICD-10-CM | POA: Diagnosis not present

## 2020-01-08 DIAGNOSIS — H401111 Primary open-angle glaucoma, right eye, mild stage: Secondary | ICD-10-CM | POA: Diagnosis not present

## 2020-01-08 DIAGNOSIS — H52221 Regular astigmatism, right eye: Secondary | ICD-10-CM | POA: Diagnosis not present

## 2020-01-08 DIAGNOSIS — H25819 Combined forms of age-related cataract, unspecified eye: Secondary | ICD-10-CM | POA: Diagnosis not present

## 2020-01-24 ENCOUNTER — Encounter: Payer: Self-pay | Admitting: Orthopaedic Surgery

## 2020-01-24 ENCOUNTER — Ambulatory Visit (INDEPENDENT_AMBULATORY_CARE_PROVIDER_SITE_OTHER): Payer: PPO | Admitting: Orthopaedic Surgery

## 2020-01-24 ENCOUNTER — Other Ambulatory Visit: Payer: Self-pay

## 2020-01-24 VITALS — Ht 74.0 in | Wt 181.0 lb

## 2020-01-24 DIAGNOSIS — M7542 Impingement syndrome of left shoulder: Secondary | ICD-10-CM

## 2020-01-24 MED ORDER — METHYLPREDNISOLONE ACETATE 40 MG/ML IJ SUSP
80.0000 mg | INTRAMUSCULAR | Status: AC | PRN
  Administered 2020-01-24: 80 mg via INTRA_ARTICULAR

## 2020-01-24 MED ORDER — BUPIVACAINE HCL 0.5 % IJ SOLN
2.0000 mL | INTRAMUSCULAR | Status: AC | PRN
  Administered 2020-01-24: 2 mL via INTRA_ARTICULAR

## 2020-01-24 MED ORDER — LIDOCAINE HCL 2 % IJ SOLN
2.0000 mL | INTRAMUSCULAR | Status: AC | PRN
  Administered 2020-01-24: 2 mL

## 2020-01-24 NOTE — Progress Notes (Signed)
Office Visit Note   Patient: Andrew Payne           Date of Birth: 02-Jul-1945           MRN: [id number] Visit Date: 01/24/2020              Requested by: Marton Redwood, MD [address] [place],  [place] [address] PCP: Marton Redwood, MD   Assessment & Plan: Visit Diagnoses:  1. Impingement syndrome of left shoulder     Plan: Makaio has experienced recurrent symptoms of impingement syndrome left shoulder with pain in the subacromial region with overhead activity and abduction.  He has had successful subacromial cortisone injections in the past.  I will repeat the injection today.  We have also discussed MRI scanning to determine a specific diagnosis.  He will let me know if this injection does not work and we will set that up over the phone  Follow-Up Instructions: Return if symptoms worsen or fail to improve.   Orders:  Orders Placed This Encounter  Procedures  . Large Joint Inj: L subacromial bursa   No orders of the defined types were placed in this encounter.     Procedures: Large Joint Inj: L subacromial bursa on 01/24/2020 4:41 PM Indications: pain and diagnostic evaluation Details: 25 G 1.5 in needle, anterolateral approach  Arthrogram: No  Medications: 2 mL lidocaine 2 %; 2 mL bupivacaine 0.5 %; 80 mg methylPREDNISolone acetate 40 MG/ML Consent was given by the patient. Immediately prior to procedure a time out was called to verify the correct patient, procedure, equipment, support staff and site/side marked as required. Patient was prepped and draped in the usual sterile fashion.       Clinical Data: No additional findings.   Subjective: Chief Complaint  Patient presents with  . Left Shoulder - Pain  Patient presents today for left shoulder pain. He was last seen on 08/23/2019 and received a cortisone injection. He states that he received great relief with the injection, and started to notice pain again about a month ago. He states that his pain has worsened  since his last visit. He has some pain that radiates to his left side of his neck and also some burning into his arm. He has pain with reaching outward or behind. He is right hand dominant. He does not take anything for pain.   HPI  Review of Systems   Objective: Vital Signs: Ht 6\' 2"  (1.88 m)   Wt 181 lb (82.1 kg)   BMI 23.24 kg/m   Physical Exam Constitutional:      Appearance: He is well-developed.  Eyes:     Pupils: Pupils are equal, round, and reactive to light.  Pulmonary:     Effort: Pulmonary effort is normal.  Skin:    General: Skin is warm and dry.  Neurological:     Mental Status: He is alert and oriented to person, place, and time.  Psychiatric:        Behavior: Behavior normal.     Ortho Exam awake alert and oriented x3.  Comfortable sitting.  There is some lateral subacromial pain with abduction and with impingement testing.  No crepitation.  No localized areas of tenderness.  Does have hypertrophic changes at the [facility] joints bilaterally more so on the asymptomatic right side.  Good grip and good release.  Biceps intact.  Skin intact.  Good strength Specialty Comments:  No specialty comments available.  Imaging: No results found.   PMFS History:  Patient Active Problem List   Diagnosis Date Noted  . Impingement syndrome of left shoulder 08/23/2019  . Unilateral primary osteoarthritis, right knee 11/01/2017  . Chest pain 08/07/2017  . Bradycardia 08/07/2017  . History of right tennis elbow 05/09/2017  . Right tennis elbow 04/07/2017  . Prolapsed internal hemorrhoids, grade 3 06/03/2016  . Hx of adenomatous colonic polyps 05/27/2016   Past Medical History:  Diagnosis Date  . Bradycardia    cardiology-- dr Curt Bears--  work-up done included event monitor and echo (all in epic)  . Carotid artery stenosis    per pt H&P dated 10-20-2018 --  <40%  . Coronary atherosclerosis   . Glaucoma, right eye   . History of nuclear stress test 04/15/2005   (in epic)   for near syncope--- Low risk normal no ischemia, ef 57%  . Hx of adenomatous colonic polyps 05/27/2016  . Internal hemorrhoids   . Morton neuroma, left   . OA (osteoarthritis)    wrist, elbow, thumb, knees  . PVC's (premature ventricular contractions)   . RBBB (right bundle branch block)    States heart rate has been as low as 28 and asyptomatic    Family History  Problem Relation Age of Onset  . Heart disease Mother   . Alzheimer's disease Father   . Colon polyps Father   . Colon cancer Neg Hx   . Pancreatic cancer Neg Hx   . Prostate cancer Neg Hx   . Rectal cancer Neg Hx   . Esophageal cancer Neg Hx     Past Surgical History:  Procedure Laterality Date  . COLONOSCOPY  05/2016   TA  . ELBOW SURGERY Left 04/2017  . EXCISION MORTON'S NEUROMA Left 10/23/2018   Procedure: EXCISION MORTON'S NEUROMA LEFT FOOT;  Surgeon: Rosemary Holms, DPM;  Location: [facility];  Service: Podiatry;  Laterality: Left;  . HEMORRHOID BANDING  2018  . INGUINAL HERNIA REPAIR Left 06/14/2017   Procedure: LEFT INGUINAL HERNIA REPAIR ERAS PATHWAY;  Surgeon: Erroll Luna, MD;  Location: [facility];  Service: General;  Laterality: Left;  . INGUINAL HERNIA REPAIR Right 11-18-2009   dr Redmond Pulling  @[facility]   . INSERTION OF MESH Left 06/14/2017   Procedure: INSERTION OF MESH;  Surgeon: Erroll Luna, MD;  Location: [facility];  Service: General;  Laterality: Left;  . KNEE ARTHROSCOPY W/ MENISCAL REPAIR Right 09/2015  . POLYPECTOMY    . TONSILLECTOMY AND ADENOIDECTOMY  child   Social History   Occupational History  . Not on file  Tobacco Use  . Smoking status: Never Smoker  . Smokeless tobacco: Never Used  Vaping Use  . Vaping Use: Never used  Substance and Sexual Activity  . Alcohol use: Not Currently  . Drug use: No  . Sexual activity: Not on file

## 2020-02-02 DIAGNOSIS — Z23 Encounter for immunization: Secondary | ICD-10-CM | POA: Diagnosis not present

## 2020-02-12 DIAGNOSIS — M7742 Metatarsalgia, left foot: Secondary | ICD-10-CM | POA: Diagnosis not present

## 2020-02-12 DIAGNOSIS — M216X2 Other acquired deformities of left foot: Secondary | ICD-10-CM | POA: Diagnosis not present

## 2020-02-12 DIAGNOSIS — M659 Synovitis and tenosynovitis, unspecified: Secondary | ICD-10-CM | POA: Diagnosis not present

## 2020-02-12 DIAGNOSIS — Q6672 Congenital pes cavus, left foot: Secondary | ICD-10-CM | POA: Diagnosis not present

## 2020-02-18 ENCOUNTER — Encounter: Payer: Self-pay | Admitting: Orthopaedic Surgery

## 2020-02-18 DIAGNOSIS — M7542 Impingement syndrome of left shoulder: Secondary | ICD-10-CM

## 2020-02-18 DIAGNOSIS — M25512 Pain in left shoulder: Secondary | ICD-10-CM

## 2020-02-18 DIAGNOSIS — G8929 Other chronic pain: Secondary | ICD-10-CM

## 2020-02-18 NOTE — Telephone Encounter (Signed)
Will need an MRI of left shoulder

## 2020-03-10 ENCOUNTER — Ambulatory Visit
Admission: RE | Admit: 2020-03-10 | Discharge: 2020-03-10 | Disposition: A | Discharge status: Home / Self Care | Payer: PPO | Source: Ambulatory Visit | Attending: Orthopaedic Surgery | Admitting: Orthopaedic Surgery

## 2020-03-10 ENCOUNTER — Other Ambulatory Visit: Payer: Self-pay

## 2020-03-10 DIAGNOSIS — M19012 Primary osteoarthritis, left shoulder: Secondary | ICD-10-CM | POA: Diagnosis not present

## 2020-03-10 DIAGNOSIS — G8929 Other chronic pain: Secondary | ICD-10-CM

## 2020-03-10 DIAGNOSIS — M75102 Unspecified rotator cuff tear or rupture of left shoulder, not specified as traumatic: Secondary | ICD-10-CM | POA: Diagnosis not present

## 2020-03-10 DIAGNOSIS — M25512 Pain in left shoulder: Secondary | ICD-10-CM

## 2020-03-10 DIAGNOSIS — M7542 Impingement syndrome of left shoulder: Secondary | ICD-10-CM

## 2020-03-13 ENCOUNTER — Encounter: Payer: Self-pay | Admitting: Orthopaedic Surgery

## 2020-03-13 ENCOUNTER — Other Ambulatory Visit: Payer: Self-pay

## 2020-03-13 ENCOUNTER — Ambulatory Visit: Payer: PPO | Admitting: Orthopaedic Surgery

## 2020-03-13 VITALS — Ht 74.0 in | Wt 181.0 lb

## 2020-03-13 DIAGNOSIS — M75112 Incomplete rotator cuff tear or rupture of left shoulder, not specified as traumatic: Secondary | ICD-10-CM | POA: Diagnosis not present

## 2020-03-13 DIAGNOSIS — M7542 Impingement syndrome of left shoulder: Secondary | ICD-10-CM | POA: Diagnosis not present

## 2020-03-13 NOTE — Progress Notes (Signed)
Office Visit Note   Patient: Andrew Payne           Date of Birth: [birth year]           MRN: [id number] Visit Date: 03/13/2020              Requested by: Marton Redwood, MD [address] [place],  [place] [address] PCP: Marton Redwood, MD   Assessment & Plan: Visit Diagnoses:  1. Impingement syndrome of left shoulder   2. Nontraumatic incomplete tear of left rotator cuff     Plan:  At this time Dr. Durward Fortes has had a long discussion with Mr. Terpening and at this time about his MRI scan and treatment options.  He will be scheduled for physical therapy.  Once he has completed physical therapy he can return at that time if he still having problems.  Follow-Up Instructions: No follow-ups on file.   Face-to-face time spent with patient was greater than 30 minutes.  Greater than 50% of the time was spent in counseling and coordination of care.  Orders:  Orders Placed This Encounter  Procedures  . Ambulatory referral to Physical Therapy   No orders of the defined types were placed in this encounter.     Procedures: No procedures performed   Clinical Data: No additional findings.   Subjective: Chief Complaint  Patient presents with  . Left Shoulder - Follow-up    MRI review   HPI Patient presents today for follow up on his left shoulder. He had an MRI performed on 03/10/2020 and is here today for those results. He said that his shoulder has popped three different times since his last visit. Each time he has pain along with the pop. It takes time, but the pain does go away. He has been experiencing weakness in his left arm as well. He is right hand dominant. He is not taking anything for pain.  Returns today for review of his MRI scan.   Review of Systems  Constitutional: Negative for fatigue.  HENT: Negative for ear pain.   Eyes: Negative for pain.  Respiratory: Negative for shortness of breath.   Cardiovascular: Negative for leg swelling.  Gastrointestinal: Negative  for constipation and diarrhea.  Endocrine: Negative for cold intolerance and heat intolerance.  Genitourinary: Negative for difficulty urinating.  Musculoskeletal: Negative for joint swelling.  Skin: Negative for rash.  Allergic/Immunologic: Negative for food allergies.  Neurological: Positive for weakness.  Psychiatric/Behavioral: Negative for sleep disturbance.     Objective: Vital Signs: Ht 6\' 2"  (1.88 m)   Wt 181 lb (82.1 kg)   BMI 23.24 kg/m   Physical Exam Constitutional:      Appearance: He is well-developed.  Eyes:     Pupils: Pupils are equal, round, and reactive to light.  Pulmonary:     Effort: Pulmonary effort is normal.  Skin:    General: Skin is warm and dry.  Neurological:     Mental Status: He is alert and oriented to person, place, and time.  Psychiatric:        Behavior: Behavior normal.     Ortho Exam  Exam today reveals some tenderness to palpation over the [facility] joint.  Feels hypertrophic.  He has 135 degrees of abduction and forward flexion.  He is weak and empty can testing listed grade 1 and also with 90 degrees of abduction of the left shoulder.   Specialty Comments:  No specialty comments available.  Imaging: MR Shoulder Left w/o contrast  Result  Date: 03/10/2020 CLINICAL DATA:  Shoulder pain for 2-3 years. Steroid injection 1 month ago with temporary relief. Patient subsequently felt a pop and has superior shoulder pain. No previous relevant surgery. EXAM: MRI OF THE LEFT SHOULDER WITHOUT CONTRAST TECHNIQUE: Multiplanar, multisequence MR imaging of the shoulder was performed. No intravenous contrast was administered. COMPARISON:  Radiographs 08/23/2019 FINDINGS: Rotator cuff: Supraspinatus tendinosis with an atypical high-grade partial bursal surface tear near the musculotendinous junction. There is tendon retraction medial to the acromioclavicular joint, best seen the coronal and axial images. The infraspinatus, subscapularis and teres minor  tendons are intact. Muscles: As above, atypical partial tear of the supraspinatus tendon with apparent significant tendon retraction. The low signal within the supraspinatus muscle may in part reflect hemorrhage. There is surrounding T2 hyperintensity. No other focal muscular atrophy or edema. Biceps long head:  Intact and normally positioned. Acromioclavicular Joint: The acromion is type 2. There are mild acromioclavicular degenerative changes. There is a small amount of fluid in the subacromial-subdeltoid bursa. Glenohumeral Joint: Minimal glenohumeral degenerative changes. No significant shoulder joint effusion. Labrum:  No evidence of labral tear or paralabral cyst. Bones: No acute or significant extra-articular osseous findings. Other: No significant soft tissue findings. IMPRESSION: 1. Atypical high-grade partial bursal surface tear of the supraspinatus tendon near the musculotendinous junction with prominent tendon retraction. 2. The additional components of the rotator cuff, biceps tendon and labrum appear intact. 3. Mild acromioclavicular and glenohumeral degenerative changes. Electronically Signed   By: Richardean Sale M.D.   On: 03/10/2020 12:29      PMFS History:  Current Outpatient Medications  Medication Sig Dispense Refill  . Cholecalciferol (VITAMIN D3) 1000 units CAPS Take 1 capsule by mouth daily.    . dorzolamide (TRUSOPT) 2 % ophthalmic solution Place 1 drop into the right eye daily.     Marland Kitchen latanoprost (XALATAN) 0.005 % ophthalmic solution Place 1 drop into the right eye at bedtime.     . Omega-3 Fatty Acids (FISH OIL) 1000 MG CAPS Take 1 capsule by mouth 2 (two) times daily.     No current facility-administered medications for this visit.    Patient Active Problem List   Diagnosis Date Noted  . Nontraumatic incomplete tear of left rotator cuff 03/13/2020  . Impingement syndrome of left shoulder 08/23/2019  . Unilateral primary osteoarthritis, right knee 11/01/2017  . Chest  pain 08/07/2017  . Bradycardia 08/07/2017  . History of right tennis elbow 05/09/2017  . Right tennis elbow 04/07/2017  . Prolapsed internal hemorrhoids, grade 3 06/03/2016  . Hx of adenomatous colonic polyps 05/27/2016   Past Medical History:  Diagnosis Date  . Bradycardia    cardiology-- dr Curt Bears--  work-up done included event monitor and echo (all in epic)  . Carotid artery stenosis    per pt H&P dated 10-20-2018 --  <40%  . Coronary atherosclerosis   . Glaucoma, right eye   . History of nuclear stress test 04/15/2005   (in epic)  for near syncope--- Low risk normal no ischemia, ef 57%  . Hx of adenomatous colonic polyps 05/27/2016  . Internal hemorrhoids   . Morton neuroma, left   . OA (osteoarthritis)    wrist, elbow, thumb, knees  . PVC's (premature ventricular contractions)   . RBBB (right bundle branch block)    States heart rate has been as low as 28 and asyptomatic    Family History  Problem Relation Age of Onset  . Heart disease Mother   . Alzheimer's disease Father   .  Colon polyps Father   . Colon cancer Neg Hx   . Pancreatic cancer Neg Hx   . Prostate cancer Neg Hx   . Rectal cancer Neg Hx   . Esophageal cancer Neg Hx     Past Surgical History:  Procedure Laterality Date  . COLONOSCOPY  05/2016   TA  . ELBOW SURGERY Left 04/2017  . EXCISION MORTON'S NEUROMA Left 10/23/2018   Procedure: EXCISION MORTON'S NEUROMA LEFT FOOT;  Surgeon: Rosemary Holms, DPM;  Location: [facility];  Service: Podiatry;  Laterality: Left;  . HEMORRHOID BANDING  2018  . INGUINAL HERNIA REPAIR Left 06/14/2017   Procedure: LEFT INGUINAL HERNIA REPAIR ERAS PATHWAY;  Surgeon: Erroll Luna, MD;  Location: [place];  Service: General;  Laterality: Left;  . INGUINAL HERNIA REPAIR Right 11-18-2009   dr Redmond Pulling  @[facility]   . INSERTION OF MESH Left 06/14/2017   Procedure: INSERTION OF MESH;  Surgeon: Erroll Luna, MD;  Location: [facility];   Service: General;  Laterality: Left;  . KNEE ARTHROSCOPY W/ MENISCAL REPAIR Right 09/2015  . POLYPECTOMY    . TONSILLECTOMY AND ADENOIDECTOMY  child   Social History   Occupational History  . Not on file  Tobacco Use  . Smoking status: Never Smoker  . Smokeless tobacco: Never Used  Vaping Use  . Vaping Use: Never used  Substance and Sexual Activity  . Alcohol use: Not Currently  . Drug use: No  . Sexual activity: Not on file

## 2020-03-18 ENCOUNTER — Ambulatory Visit: Payer: PPO | Admitting: Rehabilitative and Restorative Service Providers"

## 2020-03-18 ENCOUNTER — Other Ambulatory Visit: Payer: Self-pay

## 2020-03-18 ENCOUNTER — Encounter: Payer: Self-pay | Admitting: Rehabilitative and Restorative Service Providers"

## 2020-03-18 DIAGNOSIS — M6281 Muscle weakness (generalized): Secondary | ICD-10-CM

## 2020-03-18 DIAGNOSIS — M25512 Pain in left shoulder: Secondary | ICD-10-CM

## 2020-03-18 DIAGNOSIS — M25612 Stiffness of left shoulder, not elsewhere classified: Secondary | ICD-10-CM | POA: Diagnosis not present

## 2020-03-18 DIAGNOSIS — G8929 Other chronic pain: Secondary | ICD-10-CM | POA: Diagnosis not present

## 2020-03-18 NOTE — Patient Instructions (Signed)
Access Code: [email or web address] URL: [email or web address] Date: 03/18/2020 Prepared by: Vista Mink  Exercises Supine Scapular Protraction in Flexion with Dumbbells - 2-3 x daily - 7 x weekly - 1 sets - 20 reps - 3 seconds hold Supine Shoulder Internal Rotation Stretch - 2-3 x daily - 7 x weekly - 1 sets - 10-20 reps - 10 secondsinutes hold Standing Scapular Retraction - 5 x daily - 7 x weekly - 1 sets - 5 reps - 5 second hold Shoulder External Rotation with Anchored Resistance with Towel Under Elbow - 1 x daily - 3 x weekly - 2-3 sets - 10 reps - 3 hold

## 2020-03-18 NOTE — Therapy (Signed)
[facility] Therapy [address] [place], [place], [address] Phone: [phone]   Fax:  [phone]  Physical Therapy Evaluation  Patient Details  Name: DEMARRIUS GUERRERO MRN: [id number] Date of Birth: 1945-12-27 Referring Provider (PT): Garald Balding   Encounter Date: 03/18/2020   PT End of Session - 03/18/20 1600    Visit Number 1    Number of Visits 8    Date for PT Re-Evaluation 04/25/20    PT Start Time 0800    PT Stop Time 0845    PT Time Calculation (min) 45 min    Activity Tolerance No increased pain;Patient tolerated treatment well    Behavior During Therapy [facility] for tasks assessed/performed           Past Medical History:  Diagnosis Date   Bradycardia    cardiology-- dr Curt Bears--  work-up done included event monitor and echo (all in epic)   Carotid artery stenosis    per pt H&P dated 10-20-2018 --  <40%   Coronary atherosclerosis    Glaucoma, right eye    History of nuclear stress test 04/15/2005   (in epic)  for near syncope--- Low risk normal no ischemia, ef 57%   Hx of adenomatous colonic polyps 05/27/2016   Internal hemorrhoids    Morton neuroma, left    OA (osteoarthritis)    wrist, elbow, thumb, knees   PVC's (premature ventricular contractions)    RBBB (right bundle branch block)    States heart rate has been as low as 28 and asyptomatic    Past Surgical History:  Procedure Laterality Date   COLONOSCOPY  05/2016   TA   ELBOW SURGERY Left 04/2017   EXCISION MORTON'S NEUROMA Left 10/23/2018   Procedure: EXCISION MORTON'S NEUROMA LEFT FOOT;  Surgeon: Rosemary Holms, DPM;  Location: [employer];  Service: Podiatry;  Laterality: Left;   HEMORRHOID BANDING  2018   INGUINAL HERNIA REPAIR Left 06/14/2017   Procedure: LEFT INGUINAL HERNIA REPAIR ERAS PATHWAY;  Surgeon: Erroll Luna, MD;  Location: [place];  Service: General;  Laterality: Left;   INGUINAL HERNIA REPAIR Right  11-18-2009   dr Redmond Pulling  @[facility]    INSERTION OF MESH Left 06/14/2017   Procedure: INSERTION OF MESH;  Surgeon: Erroll Luna, MD;  Location: [place];  Service: General;  Laterality: Left;   KNEE ARTHROSCOPY W/ MENISCAL REPAIR Right 09/2015   POLYPECTOMY     TONSILLECTOMY AND ADENOIDECTOMY  child    There were no vitals filed for this visit.    Subjective Assessment - 03/18/20 1555    Subjective [name] has a history of L shoulder pain.  Sleep has started to be affected and he has had several episodes of sharp pain over the recent past (sleeping, playing golf, reaching to the back seat of the car).  He is following-up with Dr. Durward Fortes in 4 weeks to decide if he will be a RTC repair candidate.    Currently in Pain? Yes    Pain Score 4     Pain Location Shoulder    Pain Orientation Left    Pain Descriptors / Indicators Aching;Sore    Pain Type Chronic pain    Pain Onset More than a month ago    Pain Frequency Intermittent    Aggravating Factors  Golf, sleeping, reaching in certain positions    Effect of Pain on Daily Activities Unable to participate in normal ADLs    Multiple Pain Sites No  [facility] PT Assessment - 03/18/20 0001      Assessment   Medical Diagnosis L shoulder RTC tear/impingement    Referring Provider (PT) Garald Balding    Onset Date/Surgical Date --   2-3 year chronic problem   Hand Dominance Right      Balance Screen   Has the patient fallen in the past 6 months No    Has the patient had a decrease in activity level because of a fear of falling?  No    Is the patient reluctant to leave their home because of a fear of falling?  No      Prior Function   Level of Independence Independent      Cognition   Overall Cognitive Status Within Functional Limits for tasks assessed      Observation/Other Assessments   Focus on Therapeutic Outcomes (FOTO)  70 (Goal 74)      ROM / Strength   AROM / PROM / Strength AROM;Strength       AROM   Overall AROM  Deficits    AROM Assessment Site Shoulder    Right/Left Shoulder Left;Right    Right Shoulder Flexion 160 Degrees    Right Shoulder Internal Rotation 50 Degrees    Right Shoulder External Rotation 100 Degrees    Right Shoulder Horizontal  ADduction 40 Degrees    Left Shoulder Flexion 145 Degrees    Left Shoulder Internal Rotation 25 Degrees    Left Shoulder External Rotation 75 Degrees    Left Shoulder Horizontal ADduction 25 Degrees      Strength   Overall Strength Deficits    Strength Assessment Site Shoulder    Right/Left Shoulder Left;Right    Right Shoulder Internal Rotation 5/5    Right Shoulder External Rotation 5/5    Left Shoulder Internal Rotation 5/5    Left Shoulder External Rotation 3-/5                      Objective measurements completed on examination: See above findings.       [facility] Adult PT Treatment/Exercise - 03/18/20 0001      Exercises   Exercises Shoulder      Shoulder Exercises: Supine   Protraction Strengthening;Left;20 reps   3 seconds   Protraction Weight (lbs) Next visit (5#)    Internal Rotation AROM;Left;20 reps   10 seconds     Shoulder Exercises: Standing   External Rotation Strengthening;Left;10 reps;Theraband   2 sets   Theraband Level (Shoulder External Rotation) Level 4 (Blue)    Retraction Strengthening;Both;10 reps   5 seconds                 PT Education - 03/18/20 1558    Education Details Discussed exam findings, reviewed shoulder anatomy, discussed surgical and non-surgical options (focused on non-surgical with specific surgical options referred to Dr. Durward Fortes).  Reviewed and corrected starter HEP.    Person(s) Educated Patient    Methods Explanation;Demonstration;Verbal cues;Handout;Tactile cues    Comprehension Returned demonstration;Need further instruction;Verbal cues required;Verbalized understanding;Tactile cues required               PT Long Term Goals - 03/18/20  1603      PT LONG TERM GOAL #1   Title Improve FOTO to 74.    Baseline 70    Time 4    Period Weeks    Status New    Target Date 04/25/20      PT LONG TERM  GOAL #2   Title Improve L shoulder AROM for flexion to 160; IR to 60; ER to 90 and HA to 40 degrees.    Baseline See objective    Time 4    Period Weeks    Status New    Target Date 04/25/20      PT LONG TERM GOAL #3   Title Improve L shoulder strength as assessed by MMT, FOTO score and functional self-report.    Baseline See objective    Time 4    Period Weeks    Status New    Target Date 04/25/20      PT LONG TERM GOAL #4   Title Improve L shoulder pain to consistently 0-3/10 on the Numeric Pain Rating Scale.    Baseline Can be a brief sharp 5-7/10 pain    Time 4    Period Weeks    Status New    Target Date 04/25/20      PT LONG TERM GOAL #5   Title [name] will be independent with his long-term HEP at DC.    Time 4    Period Weeks    Status New    Target Date 04/25/20                  Plan - 03/18/20 1600    Clinical Impression Statement [name] has a retracted L rotator cuff tear and some AC joint arthritis.  His capsule is tight and he has scapular and rotator cuff strength impairments.  We discussed a 4 week trial of PT to see how his stability, pain and function respond before his follow-up with Dr. Durward Fortes to see if he is a surgical candidate.    Examination-Activity Limitations Reach Overhead;Lift;Sleep    Examination-Participation Restrictions Community Activity    Stability/Clinical Decision Making Stable/Uncomplicated    Clinical Decision Making Low    Rehab Potential Good    PT Frequency 2x / week    PT Duration 4 weeks    PT Treatment/Interventions ADLs/Self Care Home Management;Moist Heat;Cryotherapy;Therapeutic activities;Therapeutic exercise;Neuromuscular re-education;Patient/family education;Manual techniques    PT Next Visit Plan Continue scapular and rotator cuff strengthening    PT  Home Exercise Plan [email or web address]    Consulted and Agree with Plan of Care Patient           Patient will benefit from skilled therapeutic intervention in order to improve the following deficits and impairments:  Decreased range of motion, Decreased strength, Impaired UE functional use, Pain  Visit Diagnosis: Stiffness of left shoulder, not elsewhere classified  Chronic left shoulder pain  Muscle weakness (generalized)     Problem List Patient Active Problem List   Diagnosis Date Noted   Nontraumatic incomplete tear of left rotator cuff 03/13/2020   Impingement syndrome of left shoulder 08/23/2019   Unilateral primary osteoarthritis, right knee 11/01/2017   Chest pain 08/07/2017   Bradycardia 08/07/2017   History of right tennis elbow 05/09/2017   Right tennis elbow 04/07/2017   Prolapsed internal hemorrhoids, grade 3 06/03/2016   Hx of adenomatous colonic polyps 05/27/2016    Farley Ly PT, MPT 03/18/2020, 4:07 PM  [facility] [address] [place], [place], [address] Phone: [phone]   Fax:  [phone]  Name: ZAREK RELPH MRN: [id number] Date of Birth: [birth year]

## 2020-03-24 ENCOUNTER — Other Ambulatory Visit: Payer: Self-pay

## 2020-03-24 ENCOUNTER — Ambulatory Visit: Payer: PPO | Admitting: Physical Therapy

## 2020-03-24 DIAGNOSIS — M6281 Muscle weakness (generalized): Secondary | ICD-10-CM | POA: Diagnosis not present

## 2020-03-24 DIAGNOSIS — G8929 Other chronic pain: Secondary | ICD-10-CM

## 2020-03-24 DIAGNOSIS — M25612 Stiffness of left shoulder, not elsewhere classified: Secondary | ICD-10-CM | POA: Diagnosis not present

## 2020-03-24 DIAGNOSIS — M25512 Pain in left shoulder: Secondary | ICD-10-CM | POA: Diagnosis not present

## 2020-03-24 NOTE — Patient Instructions (Signed)
Access Code: [email or web address] URL: [email or web address] Date: 03/24/2020 Prepared by: Elsie Ra  Exercises Sidelying Shoulder ER with Towel and Dumbbell - 1-2 x daily - 6 x weekly - 2 sets - 10 reps - 1-2 sec hold Sidelying Shoulder Abduction Palm Forward - 1-2 x daily - 6 x weekly - 2 sets - 10 reps Standing Isometric Shoulder External Rotation with Doorway - 1-2 x daily - 6 x weekly - 1-2 sets - 10 reps - 5 sec hold Standing Isometric Shoulder Abduction with Doorway - Arm Bent - 1-2 x daily - 6 x weekly - 1-2 sets - 10 reps - 5 sec hold Prone Single Arm Shoulder Y - 1-2 x daily - 6 x weekly - 1-2 sets - 10 reps - 5 sec hold Prone Shoulder Horizontal Abduction - 1-2 x daily - 6 x weekly - 1-2 sets - 10 reps - 5 sec hold

## 2020-03-24 NOTE — Therapy (Signed)
[facility] Physical Therapy [address] [place], [place], [address] Phone: [phone]   Fax:  [phone]  Physical Therapy Treatment  Patient Details  Name: Andrew Payne MRN: [id number] Date of Birth: 07-26-1945 Referring Provider (PT): Andrew Payne   Encounter Date: 03/24/2020   PT End of Session - 03/24/20 1302    Visit Number 2    Number of Visits 8    Date for PT Re-Evaluation 04/25/20    PT Start Time 7121    PT Stop Time 1235    PT Time Calculation (min) 50 min    Activity Tolerance No increased pain;Patient tolerated treatment well    Behavior During Therapy [facility] for tasks assessed/performed           Past Medical History:  Diagnosis Date  . Bradycardia    cardiology-- dr Andrew Payne--  work-up done included event monitor and echo (all in epic)  . Carotid artery stenosis    per pt H&P dated 10-20-2018 --  <40%  . Coronary atherosclerosis   . Glaucoma, right eye   . History of nuclear stress test 04/15/2005   (in epic)  for near syncope--- Low risk normal no ischemia, ef 57%  . Hx of adenomatous colonic polyps 05/27/2016  . Internal hemorrhoids   . Morton neuroma, left   . OA (osteoarthritis)    wrist, elbow, thumb, knees  . PVC's (premature ventricular contractions)   . RBBB (right bundle branch block)    States heart rate has been as low as 28 and asyptomatic    Past Surgical History:  Procedure Laterality Date  . COLONOSCOPY  05/2016   TA  . ELBOW SURGERY Left 04/2017  . EXCISION MORTON'S NEUROMA Left 10/23/2018   Procedure: EXCISION MORTON'S NEUROMA LEFT FOOT;  Surgeon: Andrew Payne, DPM;  Location: [place];  Service: Podiatry;  Laterality: Left;  . HEMORRHOID BANDING  2018  . INGUINAL HERNIA REPAIR Left 06/14/2017   Procedure: LEFT INGUINAL HERNIA REPAIR ERAS PATHWAY;  Surgeon: Andrew Luna, MD;  Location: [place];  Service: General;  Laterality: Left;  . INGUINAL HERNIA REPAIR Right  11-18-2009   dr Andrew Payne  @[facility]   . INSERTION OF MESH Left 06/14/2017   Procedure: INSERTION OF MESH;  Surgeon: Andrew Luna, MD;  Location: [place];  Service: General;  Laterality: Left;  . KNEE ARTHROSCOPY W/ MENISCAL REPAIR Right 09/2015  . POLYPECTOMY    . TONSILLECTOMY AND ADENOIDECTOMY  child    There were no vitals filed for this visit.   Subjective Assessment - 03/24/20 1205    Subjective relays the ER strengthening exercise and the IR stretch hurt his shoulder with his HEP. Other than that his shoulder is about the same    Pain Onset More than a month ago             [facility] Adult PT Treatment/Exercise - 03/24/20 0001      Shoulder Exercises: Sidelying   External Rotation Left    External Rotation Weight (lbs) 2    External Rotation Limitations 2 sets of 10    ABduction Left    ABduction Weight (lbs) 2    ABduction Limitations 2 sets of 10      Shoulder Exercises: Standing   Extension Both    Theraband Level (Shoulder Extension) Level 4 (Blue)    Extension Limitations 2 sets of 15    Row Both    Theraband Level (Shoulder Row) Level 4 (Blue)  Row Limitations 2 sets of 15    Other Standing Exercises bent over Y and T, 15 reps Lt shoulder      Shoulder Exercises: Isometric Strengthening   External Rotation Limitations 5 sec hold X 15 reps Lt    ABduction Limitations 5 sec hold X 15 reps Lt      Shoulder Exercises: Stretch   Corner Stretch Limitations doorway 30 sec X 3    Cross Chest Stretch 10 seconds;5 reps                       PT Long Term Goals - 03/18/20 1603      PT LONG TERM GOAL #1   Title Improve FOTO to 74.    Baseline 70    Time 4    Period Weeks    Status New    Target Date 04/25/20      PT LONG TERM GOAL #2   Title Improve L shoulder AROM for flexion to 160; IR to 60; ER to 90 and HA to 40 degrees.    Baseline See objective    Time 4    Period Weeks    Status New    Target Date 04/25/20      PT LONG TERM  GOAL #3   Title Improve L shoulder strength as assessed by MMT, FOTO score and functional self-report.    Baseline See objective    Time 4    Period Weeks    Status New    Target Date 04/25/20      PT LONG TERM GOAL #4   Title Improve L shoulder pain to consistently 0-3/10 on the Numeric Pain Rating Scale.    Baseline Can be a brief sharp 5-7/10 pain    Time 4    Period Weeks    Status New    Target Date 04/25/20      PT LONG TERM GOAL #5   Title [name] will be independent with his long-term HEP at DC.    Time 4    Period Weeks    Status New    Target Date 04/25/20                 Plan - 03/24/20 1303    Clinical Impression Statement Since he was having some pain with 2 of the exercises in HEP, this was modified today and he had better tolerance with this. HEP was revised some to add in these modifications with additional scapular strengthening. He was encouraged not to push into pain free ROM and if he is having pain to back off that paticular exercise. Continue POC    Examination-Activity Limitations Reach Overhead;Lift;Sleep    Examination-Participation Restrictions Community Activity    Stability/Clinical Decision Making Stable/Uncomplicated    Rehab Potential Good    PT Frequency 2x / week    PT Duration 4 weeks    PT Treatment/Interventions ADLs/Self Care Home Management;Moist Heat;Cryotherapy;Therapeutic activities;Therapeutic exercise;Neuromuscular re-education;Patient/family education;Manual techniques    PT Next Visit Plan Continue scapular and rotator cuff strengthening    PT Home Exercise Plan [email or web address]    Consulted and Agree with Plan of Care Patient           Patient will benefit from skilled therapeutic intervention in order to improve the following deficits and impairments:  Decreased range of motion, Decreased strength, Impaired UE functional use, Pain  Visit Diagnosis: Stiffness of left shoulder, not elsewhere classified  Chronic left shoulder  pain  Muscle  weakness (generalized)     Problem List Patient Active Problem List   Diagnosis Date Noted  . Nontraumatic incomplete tear of left rotator cuff 03/13/2020  . Impingement syndrome of left shoulder 08/23/2019  . Unilateral primary osteoarthritis, right knee 11/01/2017  . Chest pain 08/07/2017  . Bradycardia 08/07/2017  . History of right tennis elbow 05/09/2017  . Right tennis elbow 04/07/2017  . Prolapsed internal hemorrhoids, grade 3 06/03/2016  . Hx of adenomatous colonic polyps 05/27/2016    Silvestre Mesi 03/24/2020, 1:14 PM  [facility] Physical Therapy [address] [place], [place], [address] Phone: [phone]   Fax:  [phone]  Name: Andrew Payne MRN: [id number] Date of Birth: [birth year]

## 2020-04-02 ENCOUNTER — Other Ambulatory Visit: Payer: Self-pay

## 2020-04-02 ENCOUNTER — Ambulatory Visit: Payer: PPO | Admitting: Physical Therapy

## 2020-04-02 DIAGNOSIS — G8929 Other chronic pain: Secondary | ICD-10-CM | POA: Diagnosis not present

## 2020-04-02 DIAGNOSIS — M25612 Stiffness of left shoulder, not elsewhere classified: Secondary | ICD-10-CM | POA: Diagnosis not present

## 2020-04-02 DIAGNOSIS — M6281 Muscle weakness (generalized): Secondary | ICD-10-CM | POA: Diagnosis not present

## 2020-04-02 DIAGNOSIS — M25512 Pain in left shoulder: Secondary | ICD-10-CM | POA: Diagnosis not present

## 2020-04-02 NOTE — Therapy (Signed)
[facility] Physical Therapy [address] [place], [place], [address] Phone: [phone]   Fax:  [phone]  Physical Therapy Treatment  Patient Details  Name: Andrew Payne MRN: [id number] Date of Birth: 01-Jun-1945 Referring Provider (PT): Garald Balding   Encounter Date: 04/02/2020   PT End of Session - 04/02/20 0933    Visit Number 3    Number of Visits 8    Date for PT Re-Evaluation 04/25/20    PT Start Time 0843    PT Stop Time 0929    PT Time Calculation (min) 46 min    Activity Tolerance No increased pain;Patient tolerated treatment well    Behavior During Therapy [facility] for tasks assessed/performed           Past Medical History:  Diagnosis Date  . Bradycardia    cardiology-- dr Curt Bears--  work-up done included event monitor and echo (all in epic)  . Carotid artery stenosis    per pt H&P dated 10-20-2018 --  <40%  . Coronary atherosclerosis   . Glaucoma, right eye   . History of nuclear stress test 04/15/2005   (in epic)  for near syncope--- Low risk normal no ischemia, ef 57%  . Hx of adenomatous colonic polyps 05/27/2016  . Internal hemorrhoids   . Morton neuroma, left   . OA (osteoarthritis)    wrist, elbow, thumb, knees  . PVC's (premature ventricular contractions)   . RBBB (right bundle branch block)    States heart rate has been as low as 28 and asyptomatic    Past Surgical History:  Procedure Laterality Date  . COLONOSCOPY  05/2016   TA  . ELBOW SURGERY Left 04/2017  . EXCISION MORTON'S NEUROMA Left 10/23/2018   Procedure: EXCISION MORTON'S NEUROMA LEFT FOOT;  Surgeon: Rosemary Holms, DPM;  Location: [place];  Service: Podiatry;  Laterality: Left;  . HEMORRHOID BANDING  2018  . INGUINAL HERNIA REPAIR Left 06/14/2017   Procedure: LEFT INGUINAL HERNIA REPAIR ERAS PATHWAY;  Surgeon: Erroll Luna, MD;  Location: [facility];  Service: General;  Laterality: Left;  . INGUINAL HERNIA REPAIR Right  11-18-2009   dr Redmond Pulling  @[facility]   . INSERTION OF MESH Left 06/14/2017   Procedure: INSERTION OF MESH;  Surgeon: Erroll Luna, MD;  Location: [place];  Service: General;  Laterality: Left;  . KNEE ARTHROSCOPY W/ MENISCAL REPAIR Right 09/2015  . POLYPECTOMY    . TONSILLECTOMY AND ADENOIDECTOMY  child    There were no vitals filed for this visit.   Subjective Assessment - 04/02/20 0926    Subjective relays any ot the ER strengthening hurt, even the modifications he was given last time. He has not noticed any progress yet    Pain Onset More than a month ago              [facility] PT Assessment - 04/02/20 0001      AROM   Overall AROM Comments Lt shoulder AROM WFL            [facility] Adult PT Treatment/Exercise - 04/02/20 0001      Shoulder Exercises: Prone   Other Prone Exercises Prone Y,T,I 2 sets of 10 with 2 sec holds      Shoulder Exercises: Standing   External Rotation Left;10 reps    Theraband Level (Shoulder External Rotation) Level 2 (Red)    External Rotation Limitations 3 sets, isometric hold at neutral position with lateal step    Other Standing  Exercises ball stability rolls with 2 lb ball X 15 reps at 90 deg all planes, and 10 reps OH all planes    Other Standing Exercises wall slide into flexion with lift off /extension off wall at top X 10 reps                       PT Long Term Goals - 03/18/20 1603      PT LONG TERM GOAL #1   Title Improve FOTO to 74.    Baseline 70    Time 4    Period Weeks    Status New    Target Date 04/25/20      PT LONG TERM GOAL #2   Title Improve L shoulder AROM for flexion to 160; IR to 60; ER to 90 and HA to 40 degrees.    Baseline See objective    Time 4    Period Weeks    Status New    Target Date 04/25/20      PT LONG TERM GOAL #3   Title Improve L shoulder strength as assessed by MMT, FOTO score and functional self-report.    Baseline See objective    Time 4    Period Weeks    Status New     Target Date 04/25/20      PT LONG TERM GOAL #4   Title Improve L shoulder pain to consistently 0-3/10 on the Numeric Pain Rating Scale.    Baseline Can be a brief sharp 5-7/10 pain    Time 4    Period Weeks    Status New    Target Date 04/25/20      PT LONG TERM GOAL #5   Title [name] will be independent with his long-term HEP at [place].    Time 4    Period Weeks    Status New    Target Date 04/25/20                 Plan - 04/02/20 0102    Clinical Impression Statement He relays still no improvements with PT, overall ROM is doing well. He has pain with any ER movement and was encouraged again not to work through the pain. Modified ER exercise again to isometric hold with band with lateral step. He appears to have more tolreance with this at least in session today.He was enocuraged to try 2 more weeks of PT and if no improvments then will refer back to PT.    Examination-Activity Limitations Reach Overhead;Lift;Sleep    Examination-Participation Restrictions Community Activity    Stability/Clinical Decision Making Stable/Uncomplicated    Rehab Potential Good    PT Frequency 2x / week    PT Duration 4 weeks    PT Treatment/Interventions ADLs/Self Care Home Management;Moist Heat;Cryotherapy;Therapeutic activities;Therapeutic exercise;Neuromuscular re-education;Patient/family education;Manual techniques    PT Next Visit Plan Continue scapular and rotator cuff strengthening    PT Home Exercise Plan [email or web address]    Consulted and Agree with Plan of Care Patient           Patient will benefit from skilled therapeutic intervention in order to improve the following deficits and impairments:  Decreased range of motion, Decreased strength, Impaired UE functional use, Pain  Visit Diagnosis: Stiffness of left shoulder, not elsewhere classified  Chronic left shoulder pain  Muscle weakness (generalized)     Problem List Patient Active Problem List   Diagnosis Date Noted  .  Nontraumatic incomplete tear of left rotator cuff  03/13/2020  . Impingement syndrome of left shoulder 08/23/2019  . Unilateral primary osteoarthritis, right knee 11/01/2017  . Chest pain 08/07/2017  . Bradycardia 08/07/2017  . History of right tennis elbow 05/09/2017  . Right tennis elbow 04/07/2017  . Prolapsed internal hemorrhoids, grade 3 06/03/2016  . Hx of adenomatous colonic polyps 05/27/2016    Silvestre Mesi 04/02/2020, 9:45 AM  [facility] Physical Therapy [address] [place], [place], [address] Phone: [phone]   Fax:  [phone]  Name: JOZEF EISENBEIS MRN: [id number] Date of Birth: Jul 14, 1945

## 2020-04-03 ENCOUNTER — Ambulatory Visit: Payer: PPO

## 2020-04-04 ENCOUNTER — Ambulatory Visit: Payer: PPO | Admitting: Rehabilitative and Restorative Service Providers"

## 2020-04-04 ENCOUNTER — Other Ambulatory Visit: Payer: Self-pay

## 2020-04-04 ENCOUNTER — Encounter: Payer: Self-pay | Admitting: Rehabilitative and Restorative Service Providers"

## 2020-04-04 DIAGNOSIS — M6281 Muscle weakness (generalized): Secondary | ICD-10-CM | POA: Diagnosis not present

## 2020-04-04 DIAGNOSIS — M25612 Stiffness of left shoulder, not elsewhere classified: Secondary | ICD-10-CM

## 2020-04-04 DIAGNOSIS — M25512 Pain in left shoulder: Secondary | ICD-10-CM

## 2020-04-04 DIAGNOSIS — G8929 Other chronic pain: Secondary | ICD-10-CM

## 2020-04-04 NOTE — Therapy (Signed)
[facility] [address] [place], [place], [address] Phone: [phone]   Fax:  [phone]  Physical Therapy Treatment  Patient Details  Name: Andrew Payne MRN: [id number] Date of Birth: 1945/09/25 Referring Provider (PT): Garald Balding   Encounter Date: 04/04/2020   PT End of Session - 04/04/20 1110    Visit Number 4    Number of Visits 8    Date for PT Re-Evaluation 04/25/20    PT Start Time 0845    PT Stop Time 0928    PT Time Calculation (min) 43 min    Activity Tolerance No increased pain;Patient tolerated treatment well    Behavior During Therapy [facility] for tasks assessed/performed           Past Medical History:  Diagnosis Date  . Bradycardia    cardiology-- dr Curt Bears--  work-up done included event monitor and echo (all in epic)  . Carotid artery stenosis    per pt H&P dated 10-20-2018 --  <40%  . Coronary atherosclerosis   . Glaucoma, right eye   . History of nuclear stress test 04/15/2005   (in epic)  for near syncope--- Low risk normal no ischemia, ef 57%  . Hx of adenomatous colonic polyps 05/27/2016  . Internal hemorrhoids   . Morton neuroma, left   . OA (osteoarthritis)    wrist, elbow, thumb, knees  . PVC's (premature ventricular contractions)   . RBBB (right bundle branch block)    States heart rate has been as low as 28 and asyptomatic    Past Surgical History:  Procedure Laterality Date  . COLONOSCOPY  05/2016   TA  . ELBOW SURGERY Left 04/2017  . EXCISION MORTON'S NEUROMA Left 10/23/2018   Procedure: EXCISION MORTON'S NEUROMA LEFT FOOT;  Surgeon: Rosemary Holms, DPM;  Location: [place];  Service: Podiatry;  Laterality: Left;  . HEMORRHOID BANDING  2018  . INGUINAL HERNIA REPAIR Left 06/14/2017   Procedure: LEFT INGUINAL HERNIA REPAIR ERAS PATHWAY;  Surgeon: Erroll Luna, MD;  Location: [place];  Service: General;  Laterality: Left;  . INGUINAL HERNIA REPAIR Right  11-18-2009   dr Redmond Pulling  @[facility]   . INSERTION OF MESH Left 06/14/2017   Procedure: INSERTION OF MESH;  Surgeon: Erroll Luna, MD;  Location: [place];  Service: General;  Laterality: Left;  . KNEE ARTHROSCOPY W/ MENISCAL REPAIR Right 09/2015  . POLYPECTOMY    . TONSILLECTOMY AND ADENOIDECTOMY  child    There were no vitals filed for this visit.   Subjective Assessment - 04/04/20 1107    Subjective Andrew Payne will be playing golf later today for the first time since starting PT.  We discussed things to be aware of with the swing to avoid aggravating symptoms.    Currently in Pain? Yes    Pain Score 4     Pain Location Shoulder    Pain Orientation Left    Pain Descriptors / Indicators Aching;Sore    Pain Type Chronic pain    Pain Onset More than a month ago    Pain Frequency Intermittent    Aggravating Factors  Rolling over at night, putting on a coat, impingement position (IR with abduction)    Effect of Pain on Daily Activities Affects most L UE ADLs                             [facility] Adult PT Treatment/Exercise - 04/04/20  0001      Exercises   Exercises Shoulder      Shoulder Exercises: Supine   Protraction Strengthening;Left;20 reps   3 seconds   Protraction Weight (lbs) 3# (Up as soon as ready)    Internal Rotation AROM;Left;20 reps   10 seconds     Shoulder Exercises: Sidelying   External Rotation Left    External Rotation Weight (lbs) 1#    External Rotation Limitations 2 sets of 10      Shoulder Exercises: Standing   Row Weight (lbs) Blue theraband 20X    Retraction Strengthening;Both;10 reps   5 seconds   Other Standing Exercises Shoulder blade pinches 10X 5 seconds    Other Standing Exercises ER Isometrics 10X 5 seconds                  PT Education - 04/04/20 1109    Education Details Reviewed importance of scapular stabilization, posture and avoiding impingement postures (flexion or abduction with IR).  Reviewed HEP.     Person(s) Educated Patient    Methods Explanation;Demonstration;Tactile cues;Verbal cues;Handout    Comprehension Verbalized understanding;Returned demonstration;Need further instruction;Tactile cues required;Verbal cues required               PT Long Term Goals - 04/04/20 1110      PT LONG TERM GOAL #1   Title Improve FOTO to 74.    Baseline 70    Time 4    Period Weeks    Status On-going      PT LONG TERM GOAL #2   Title Improve L shoulder AROM for flexion to 160; IR to 60; ER to 90 and HA to 40 degrees.    Baseline See objective    Time 4    Period Weeks    Status On-going      PT LONG TERM GOAL #3   Title Improve L shoulder strength as assessed by MMT, FOTO score and functional self-report.    Baseline See objective    Time 4    Period Weeks    Status On-going      PT LONG TERM GOAL #4   Title Improve L shoulder pain to consistently 0-3/10 on the Numeric Pain Rating Scale.    Baseline Can be a brief sharp 5-7/10 pain    Time 4    Period Weeks    Status On-going      PT LONG TERM GOAL #5   Title Andrew Payne will be independent with his long-term HEP at DC.    Time 4    Period Weeks    Status On-going                 Plan - 04/04/20 1111    Clinical Impression Statement Reviewed Andrew Payne's HEP with emphasis on opening up the subacromial space.  Scapular strength, postural awareness and rotator cuff strengthening along with posterior capsular stretching will be key.  Continue targeted activities to reduce impingement and try to avoid RTC repair.    Examination-Activity Limitations Reach Overhead;Lift;Sleep    Examination-Participation Restrictions Community Activity    Stability/Clinical Decision Making Stable/Uncomplicated    Rehab Potential Good    PT Frequency 2x / week    PT Duration 4 weeks    PT Treatment/Interventions ADLs/Self Care Home Management;Moist Heat;Cryotherapy;Therapeutic activities;Therapeutic exercise;Neuromuscular re-education;Patient/family  education;Manual techniques    PT Next Visit Plan Continue scapular and rotator cuff strengthening    PT Home Exercise Plan [email or web address]    Consulted and Agree  with Plan of Care Patient           Patient will benefit from skilled therapeutic intervention in order to improve the following deficits and impairments:  Decreased range of motion,Decreased strength,Impaired UE functional use,Pain  Visit Diagnosis: Stiffness of left shoulder, not elsewhere classified  Chronic left shoulder pain  Muscle weakness (generalized)     Problem List Patient Active Problem List   Diagnosis Date Noted  . Nontraumatic incomplete tear of left rotator cuff 03/13/2020  . Impingement syndrome of left shoulder 08/23/2019  . Unilateral primary osteoarthritis, right knee 11/01/2017  . Chest pain 08/07/2017  . Bradycardia 08/07/2017  . History of right tennis elbow 05/09/2017  . Right tennis elbow 04/07/2017  . Prolapsed internal hemorrhoids, grade 3 06/03/2016  . Hx of adenomatous colonic polyps 05/27/2016    Farley Ly PT, MPT 04/04/2020, 11:13 AM  [facility] [address] [place], [place], [address] Phone: [phone]   Fax:  [phone]  Name: Andrew Payne MRN: [id number] Date of Birth: 1945-12-04

## 2020-04-08 ENCOUNTER — Other Ambulatory Visit: Payer: Self-pay

## 2020-04-08 ENCOUNTER — Encounter: Payer: Self-pay | Admitting: Physical Therapy

## 2020-04-08 ENCOUNTER — Ambulatory Visit: Payer: PPO | Admitting: Physical Therapy

## 2020-04-08 DIAGNOSIS — M25512 Pain in left shoulder: Secondary | ICD-10-CM

## 2020-04-08 DIAGNOSIS — M6281 Muscle weakness (generalized): Secondary | ICD-10-CM | POA: Diagnosis not present

## 2020-04-08 DIAGNOSIS — M25612 Stiffness of left shoulder, not elsewhere classified: Secondary | ICD-10-CM

## 2020-04-08 DIAGNOSIS — G8929 Other chronic pain: Secondary | ICD-10-CM | POA: Diagnosis not present

## 2020-04-08 NOTE — Therapy (Signed)
[facility] [address] [place], [place], [address] Phone: [phone]   Fax:  [phone]  Physical Therapy Treatment  Patient Details  Name: Andrew Payne MRN: [id number] Date of Birth: 1946-01-27 Referring Provider (PT): Garald Balding   Encounter Date: 04/08/2020   PT End of Session - 04/08/20 1443    Visit Number 5    Number of Visits 8    Date for PT Re-Evaluation 04/25/20    PT Start Time 6761    PT Stop Time 1430    PT Time Calculation (min) 45 min    Activity Tolerance No increased pain;Patient tolerated treatment well    Behavior During Therapy [facility] for tasks assessed/performed           Past Medical History:  Diagnosis Date  . Bradycardia    cardiology-- dr Curt Bears--  work-up done included event monitor and echo (all in epic)  . Carotid artery stenosis    per pt H&P dated 10-20-2018 --  <40%  . Coronary atherosclerosis   . Glaucoma, right eye   . History of nuclear stress test 04/15/2005   (in epic)  for near syncope--- Low risk normal no ischemia, ef 57%  . Hx of adenomatous colonic polyps 05/27/2016  . Internal hemorrhoids   . Morton neuroma, left   . OA (osteoarthritis)    wrist, elbow, thumb, knees  . PVC's (premature ventricular contractions)   . RBBB (right bundle branch block)    States heart rate has been as low as 28 and asyptomatic    Past Surgical History:  Procedure Laterality Date  . COLONOSCOPY  05/2016   TA  . ELBOW SURGERY Left 04/2017  . EXCISION MORTON'S NEUROMA Left 10/23/2018   Procedure: EXCISION MORTON'S NEUROMA LEFT FOOT;  Surgeon: Rosemary Holms, DPM;  Location: [place];  Service: Podiatry;  Laterality: Left;  . HEMORRHOID BANDING  2018  . INGUINAL HERNIA REPAIR Left 06/14/2017   Procedure: LEFT INGUINAL HERNIA REPAIR ERAS PATHWAY;  Surgeon: Erroll Luna, MD;  Location: [place];  Service: General;  Laterality: Left;  . INGUINAL HERNIA REPAIR Right  11-18-2009   dr Redmond Pulling  @[facility]   . INSERTION OF MESH Left 06/14/2017   Procedure: INSERTION OF MESH;  Surgeon: Erroll Luna, MD;  Location: [place];  Service: General;  Laterality: Left;  . KNEE ARTHROSCOPY W/ MENISCAL REPAIR Right 09/2015  . POLYPECTOMY    . TONSILLECTOMY AND ADENOIDECTOMY  child    There were no vitals filed for this visit.   Subjective Assessment - 04/08/20 1442    Subjective relays golf bothered his shoulder some but not too bad. However all in all his shoulder still feels about the same, no pain at rest and pain with any ER movement    Pain Onset More than a month ago            [facility] Adult PT Treatment/Exercise - 04/08/20 0001      Shoulder Exercises: Standing   Protraction Left    Theraband Level (Shoulder Protraction) Level 4 (Blue)    Protraction Limitations 2 sets of 10, chest press into protraciton punch 3 sec holds    External Rotation Left;10 reps    Theraband Level (Shoulder External Rotation) Level 2 (Red)    External Rotation Limitations then green band isometric hold at ER neutral and 2 lateral steps out and 2 steps back in, 2 sets of 10 reps    Extension Both;20 reps  Theraband Level (Shoulder Extension) Level 4 (Blue)    Row Both;20 reps    Theraband Level (Shoulder Row) Level 4 (Blue)    Row Weight (lbs) Blue theraband 20X    Other Standing Exercises body blade 30 sec X 3  flexion plane, 10-15 sec in scaption plane    Other Standing Exercises standing wall slide with Y red band loop around wrists 2 sets of 10      Shoulder Exercises: Stretch   Corner Stretch Limitations doorway 30 sec X 3    Internal Rotation Stretch 10 seconds    Internal Rotation Stretch Limitations 10 reps mod sleeper on wall                       PT Long Term Goals - 04/04/20 1110      PT LONG TERM GOAL #1   Title Improve FOTO to 74.    Baseline 70    Time 4    Period Weeks    Status On-going      PT LONG TERM GOAL #2   Title  Improve L shoulder AROM for flexion to 160; IR to 60; ER to 90 and HA to 40 degrees.    Baseline See objective    Time 4    Period Weeks    Status On-going      PT LONG TERM GOAL #3   Title Improve L shoulder strength as assessed by MMT, FOTO score and functional self-report.    Baseline See objective    Time 4    Period Weeks    Status On-going      PT LONG TERM GOAL #4   Title Improve L shoulder pain to consistently 0-3/10 on the Numeric Pain Rating Scale.    Baseline Can be a brief sharp 5-7/10 pain    Time 4    Period Weeks    Status On-going      PT LONG TERM GOAL #5   Title [name] will be independent with his long-term HEP at [place].    Time 4    Period Weeks    Status On-going                 Plan - 04/08/20 1444    Clinical Impression Statement Continued with posterior capsule strething and gentle RTC strength as able with cuing to avoid pushing into too much pain. PT will continue to progress withing his pain tolerance.    Examination-Activity Limitations Reach Overhead;Lift;Sleep    Examination-Participation Restrictions Community Activity    Stability/Clinical Decision Making Stable/Uncomplicated    Rehab Potential Good    PT Frequency 2x / week    PT Duration 4 weeks    PT Treatment/Interventions ADLs/Self Care Home Management;Moist Heat;Cryotherapy;Therapeutic activities;Therapeutic exercise;Neuromuscular re-education;Patient/family education;Manual techniques    PT Next Visit Plan Continue scapular and rotator cuff strengthening    PT Home Exercise Plan [email or web address]    Consulted and Agree with Plan of Care Patient           Patient will benefit from skilled therapeutic intervention in order to improve the following deficits and impairments:  Decreased range of motion,Decreased strength,Impaired UE functional use,Pain  Visit Diagnosis: Stiffness of left shoulder, not elsewhere classified  Chronic left shoulder pain  Muscle weakness  (generalized)     Problem List Patient Active Problem List   Diagnosis Date Noted  . Nontraumatic incomplete tear of left rotator cuff 03/13/2020  . Impingement syndrome of left shoulder 08/23/2019  .  Unilateral primary osteoarthritis, right knee 11/01/2017  . Chest pain 08/07/2017  . Bradycardia 08/07/2017  . History of right tennis elbow 05/09/2017  . Right tennis elbow 04/07/2017  . Prolapsed internal hemorrhoids, grade 3 06/03/2016  . Hx of adenomatous colonic polyps 05/27/2016    Silvestre Mesi 04/08/2020, 2:46 PM  [facility] [address] [place], [place], [address] Phone: [phone]   Fax:  [phone]  Name: Andrew Payne MRN: [id number] Date of Birth: 03-Mar-1946

## 2020-04-10 ENCOUNTER — Ambulatory Visit: Payer: PPO | Admitting: Rehabilitative and Restorative Service Providers"

## 2020-04-10 ENCOUNTER — Other Ambulatory Visit: Payer: Self-pay

## 2020-04-10 ENCOUNTER — Encounter: Payer: Self-pay | Admitting: Rehabilitative and Restorative Service Providers"

## 2020-04-10 DIAGNOSIS — G8929 Other chronic pain: Secondary | ICD-10-CM | POA: Diagnosis not present

## 2020-04-10 DIAGNOSIS — M25512 Pain in left shoulder: Secondary | ICD-10-CM | POA: Diagnosis not present

## 2020-04-10 DIAGNOSIS — M6281 Muscle weakness (generalized): Secondary | ICD-10-CM | POA: Diagnosis not present

## 2020-04-10 DIAGNOSIS — M25612 Stiffness of left shoulder, not elsewhere classified: Secondary | ICD-10-CM | POA: Diagnosis not present

## 2020-04-10 NOTE — Therapy (Signed)
[facility] [address] [place], [place], [address] Phone: [phone]   Fax:  [phone]  Physical Therapy Treatment  Patient Details  Name: Andrew Payne MRN: [id number] Date of Birth: February 26, 1946 Referring Provider (PT): Garald Balding   Encounter Date: 04/10/2020   PT End of Session - 04/10/20 1525    Visit Number 6    Number of Visits 8    Date for PT Re-Evaluation 04/25/20    PT Start Time 1430    PT Stop Time 1515    PT Time Calculation (min) 45 min    Activity Tolerance No increased pain;Patient tolerated treatment well    Behavior During Therapy [facility] for tasks assessed/performed           Past Medical History:  Diagnosis Date   Bradycardia    cardiology-- dr Curt Bears--  work-up done included event monitor and echo (all in epic)   Carotid artery stenosis    per pt H&P dated 10-20-2018 --  <40%   Coronary atherosclerosis    Glaucoma, right eye    History of nuclear stress test 04/15/2005   (in epic)  for near syncope--- Low risk normal no ischemia, ef 57%   Hx of adenomatous colonic polyps 05/27/2016   Internal hemorrhoids    Morton neuroma, left    OA (osteoarthritis)    wrist, elbow, thumb, knees   PVC's (premature ventricular contractions)    RBBB (right bundle branch block)    States heart rate has been as low as 28 and asyptomatic    Past Surgical History:  Procedure Laterality Date   COLONOSCOPY  05/2016   TA   ELBOW SURGERY Left 04/2017   EXCISION MORTON'S NEUROMA Left 10/23/2018   Procedure: EXCISION MORTON'S NEUROMA LEFT FOOT;  Surgeon: Rosemary Holms, DPM;  Location: [place];  Service: Podiatry;  Laterality: Left;   HEMORRHOID BANDING  2018   INGUINAL HERNIA REPAIR Left 06/14/2017   Procedure: LEFT INGUINAL HERNIA REPAIR ERAS PATHWAY;  Surgeon: Erroll Luna, MD;  Location: [place];  Service: General;  Laterality: Left;   INGUINAL HERNIA REPAIR Right  11-18-2009   dr Redmond Pulling  @[facility]    INSERTION OF MESH Left 06/14/2017   Procedure: INSERTION OF MESH;  Surgeon: Erroll Luna, MD;  Location: [place];  Service: General;  Laterality: Left;   KNEE ARTHROSCOPY W/ MENISCAL REPAIR Right 09/2015   POLYPECTOMY     TONSILLECTOMY AND ADENOIDECTOMY  child    There were no vitals filed for this visit.   Subjective Assessment - 04/10/20 1521    Subjective Andrew Payne reports better AROM since starting PT.  He still gets pain with certain motions with the L shoulder.    Currently in Pain? Yes    Pain Score 3     Pain Location Shoulder    Pain Orientation Left    Pain Descriptors / Indicators Aching;Sore    Pain Type Chronic pain    Pain Onset More than a month ago    Pain Frequency Intermittent    Aggravating Factors  Rolling over at night, putting a sweater or coat on/off, impingement postures    Effect of Pain on Daily Activities Pain with bed mobility and certain end range postures    Multiple Pain Sites No                             [facility] Adult PT Treatment/Exercise - 04/10/20  0001      Exercises   Exercises Shoulder      Shoulder Exercises: Supine   Protraction Strengthening;Left;20 reps   3 seconds   Protraction Weight (lbs) 4# and 5#    Internal Rotation AROM;Left;20 reps   10 seconds     Shoulder Exercises: Sidelying   External Rotation Left    External Rotation Weight (lbs) 1#    External Rotation Limitations 2 sets of 10      Shoulder Exercises: Standing   Internal Rotation AROM;Left;10 reps    Theraband Level (Shoulder Internal Rotation) --   10 seconds thumb up back and posterior capsule stretch   Row Weight (lbs) Blue theraband 20X    Retraction Strengthening;Both;10 reps   5 seconds   Other Standing Exercises Shoulder blade pinches 10X 5 seconds    Other Standing Exercises ER Isometrics 10X 5 seconds      Manual Therapy   Manual Therapy Joint mobilization    Manual therapy comments IR  AAROM with PT                  PT Education - 04/10/20 1523    Education Details Continue daily posterior capsule stretching, scapular and RTC strengthening.    Person(s) Educated Patient    Methods Explanation;Demonstration;Tactile cues;Verbal cues;Handout    Comprehension Verbal cues required;Returned demonstration;Tactile cues required;Verbalized understanding;Need further instruction               PT Long Term Goals - 04/10/20 1525      PT LONG TERM GOAL #1   Title Improve FOTO to 74.    Baseline 70    Time 4    Period Weeks    Status On-going      PT LONG TERM GOAL #2   Title Improve L shoulder AROM for flexion to 160; IR to 60; ER to 90 and HA to 40 degrees.    Baseline See objective    Time 4    Period Weeks    Status On-going      PT LONG TERM GOAL #3   Title Improve L shoulder strength as assessed by MMT, FOTO score and functional self-report.    Baseline See objective    Time 4    Period Weeks    Status On-going      PT LONG TERM GOAL #4   Title Improve L shoulder pain to consistently 0-3/10 on the Numeric Pain Rating Scale.    Baseline Can be a brief sharp 5-7/10 pain    Time 4    Period Weeks    Status On-going      PT LONG TERM GOAL #5   Title Andrew Payne will be independent with his long-term HEP at [place].    Time 4    Period Weeks    Status On-going                 Plan - 04/10/20 1525    Clinical Impression Statement Andrew Payne may be a good candidate for a RTC repair.  We discussed the benefits of a repair for someone with his high physical demand level.  RA next week with emphasis on posterior capsule stretch, scapular and RTC strengthening.    Examination-Activity Limitations Reach Overhead;Lift;Sleep    Examination-Participation Restrictions Community Activity    Stability/Clinical Decision Making Stable/Uncomplicated    Rehab Potential Good    PT Frequency 2x / week    PT Duration 4 weeks    PT Treatment/Interventions ADLs/Self Care  Home Management;Moist Heat;Cryotherapy;Therapeutic activities;Therapeutic exercise;Neuromuscular re-education;Patient/family education;Manual techniques    PT Next Visit Plan Continue scapular and rotator cuff strengthening    PT Home Exercise Plan [email or web address]    Consulted and Agree with Plan of Care Patient           Patient will benefit from skilled therapeutic intervention in order to improve the following deficits and impairments:  Decreased range of motion,Decreased strength,Impaired UE functional use,Pain  Visit Diagnosis: Stiffness of left shoulder, not elsewhere classified  Chronic left shoulder pain  Muscle weakness (generalized)     Problem List Patient Active Problem List   Diagnosis Date Noted   Nontraumatic incomplete tear of left rotator cuff 03/13/2020   Impingement syndrome of left shoulder 08/23/2019   Unilateral primary osteoarthritis, right knee 11/01/2017   Chest pain 08/07/2017   Bradycardia 08/07/2017   History of right tennis elbow 05/09/2017   Right tennis elbow 04/07/2017   Prolapsed internal hemorrhoids, grade 3 06/03/2016   Hx of adenomatous colonic polyps 05/27/2016    Farley Ly PT, MPT 04/10/2020, 3:28 PM  [facility] [address] [place], [place], [address] Phone: [phone]   Fax:  [phone]  Name: Andrew Payne MRN: [id number] Date of Birth: [birth year]

## 2020-04-15 ENCOUNTER — Ambulatory Visit: Payer: PPO | Admitting: Physical Therapy

## 2020-04-15 ENCOUNTER — Other Ambulatory Visit: Payer: Self-pay

## 2020-04-15 DIAGNOSIS — M25512 Pain in left shoulder: Secondary | ICD-10-CM

## 2020-04-15 DIAGNOSIS — M25612 Stiffness of left shoulder, not elsewhere classified: Secondary | ICD-10-CM

## 2020-04-15 DIAGNOSIS — G8929 Other chronic pain: Secondary | ICD-10-CM

## 2020-04-15 DIAGNOSIS — M6281 Muscle weakness (generalized): Secondary | ICD-10-CM

## 2020-04-15 NOTE — Therapy (Signed)
[facility] Physical Therapy [address] [place], [place], [address] Phone: [phone]   Fax:  [phone]  Physical Therapy Treatment  Patient Details  Name: Andrew Payne MRN: [id number] Date of Birth: [birth year] Referring Provider (PT): Andrew Payne   Encounter Date: 04/15/2020   PT End of Session - 04/15/20 1558    Visit Number 7    Number of Visits 8    Date for PT Re-Evaluation 04/25/20    PT Start Time [id number]    PT Stop Time 1553    PT Time Calculation (min) 38 min    Activity Tolerance No increased pain;Patient tolerated treatment well    Behavior During Therapy [facility] for tasks assessed/performed           Past Medical History:  Diagnosis Date   Bradycardia    cardiology-- dr Curt Bears--  work-up done included event monitor and echo (all in epic)   Carotid artery stenosis    per pt H&P dated 10-20-2018 --  <40%   Coronary atherosclerosis    Glaucoma, right eye    History of nuclear stress test 04/15/2005   (in epic)  for near syncope--- Low risk normal no ischemia, ef 57%   Hx of adenomatous colonic polyps 05/27/2016   Internal hemorrhoids    Morton neuroma, left    OA (osteoarthritis)    wrist, elbow, thumb, knees   PVC's (premature ventricular contractions)    RBBB (right bundle branch block)    States heart rate has been as low as 28 and asyptomatic    Past Surgical History:  Procedure Laterality Date   COLONOSCOPY  05/2016   TA   ELBOW SURGERY Left 04/2017   EXCISION MORTON'S NEUROMA Left 10/23/2018   Procedure: EXCISION MORTON'S NEUROMA LEFT FOOT;  Surgeon: Rosemary Holms, DPM;  Location: [place];  Service: Podiatry;  Laterality: Left;   HEMORRHOID BANDING  2018   INGUINAL HERNIA REPAIR Left 06/14/2017   Procedure: LEFT INGUINAL HERNIA REPAIR ERAS PATHWAY;  Surgeon: Erroll Luna, MD;  Location: [place];  Service: General;  Laterality: Left;   INGUINAL HERNIA REPAIR Right  11-18-2009   dr Redmond Pulling  @[facility]    INSERTION OF MESH Left 06/14/2017   Procedure: INSERTION OF MESH;  Surgeon: Erroll Luna, MD;  Location: [place];  Service: General;  Laterality: Left;   KNEE ARTHROSCOPY W/ MENISCAL REPAIR Right 09/2015   POLYPECTOMY     TONSILLECTOMY AND ADENOIDECTOMY  child    There were no vitals filed for this visit.   Subjective Assessment - 04/15/20 1544    Subjective relays he cant tell any improvements in pain since beginning PT    Pain Onset More than a month ago             [facility] Adult PT Treatment/Exercise - 04/15/20 0001      Shoulder Exercises: Supine   Protraction Left;20 reps    Protraction Weight (lbs) 5#      Shoulder Exercises: Prone   Other Prone Exercises Prone Y and I with 1# X 15, prone T no weight X 15      Shoulder Exercises: Sidelying   External Rotation Left    External Rotation Weight (lbs) 1#    External Rotation Limitations 3 sets of 10      Shoulder Exercises: Standing   External Rotation Left    Theraband Level (Shoulder External Rotation) Level 2 (Red)    External Rotation Limitations 3 sets of 10  Theraband Level (Shoulder Extension) Level 4 (Blue)    Extension Limitations 2 sets of 15    Theraband Level (Shoulder Row) Level 4 (Blue)    Row Limitations 2 sets of 15      Shoulder Exercises: ROM/Strengthening   Lat Pull Limitations 35 lbs 2 sets of 15      Manual Therapy   Manual therapy comments IR PROM and jt mobs A-P, inferior.                       PT Long Term Goals - 04/10/20 1525      PT LONG TERM GOAL #1   Title Improve FOTO to 74.    Baseline 70    Time 4    Period Weeks    Status On-going      PT LONG TERM GOAL #2   Title Improve L shoulder AROM for flexion to 160; IR to 60; ER to 90 and HA to 40 degrees.    Baseline See objective    Time 4    Period Weeks    Status On-going      PT LONG TERM GOAL #3   Title Improve L shoulder strength as assessed by MMT,  FOTO score and functional self-report.    Baseline See objective    Time 4    Period Weeks    Status On-going      PT LONG TERM GOAL #4   Title Improve L shoulder pain to consistently 0-3/10 on the Numeric Pain Rating Scale.    Baseline Can be a brief sharp 5-7/10 pain    Time 4    Period Weeks    Status On-going      PT LONG TERM GOAL #5   Title [name] will be independent with his long-term HEP at [place].    Time 4    Period Weeks    Status On-going                 Plan - 04/15/20 1559    Clinical Impression Statement Continued with posterior capsule stretching and mobility with scapular strengthening with overall better tolerance to resisted ER today however he really has not noticed any improvment in his shoulder since starting PT and he may need surgery. He has one more PT visit scheduled and then will follow up with ortho 04/17/20 after that.    Examination-Activity Limitations Reach Overhead;Lift;Sleep    Examination-Participation Restrictions Community Activity    Stability/Clinical Decision Making Stable/Uncomplicated    Rehab Potential Good    PT Frequency 2x / week    PT Duration 4 weeks    PT Treatment/Interventions ADLs/Self Care Home Management;Moist Heat;Cryotherapy;Therapeutic activities;Therapeutic exercise;Neuromuscular re-education;Patient/family education;Manual techniques    PT Next Visit Plan reassess and send MD note    PT Home Exercise Plan [email or web address]    Consulted and Agree with Plan of Care Patient           Patient will benefit from skilled therapeutic intervention in order to improve the following deficits and impairments:  Decreased range of motion,Decreased strength,Impaired UE functional use,Pain  Visit Diagnosis: Stiffness of left shoulder, not elsewhere classified  Chronic left shoulder pain  Muscle weakness (generalized)     Problem List Patient Active Problem List   Diagnosis Date Noted   Nontraumatic incomplete tear of left  rotator cuff 03/13/2020   Impingement syndrome of left shoulder 08/23/2019   Unilateral primary osteoarthritis, right knee 11/01/2017   Chest pain 08/07/2017  Bradycardia 08/07/2017   History of right tennis elbow 05/09/2017   Right tennis elbow 04/07/2017   Prolapsed internal hemorrhoids, grade 3 06/03/2016   Hx of adenomatous colonic polyps 05/27/2016    Silvestre Mesi 04/15/2020, 4:06 PM  [facility] Physical Therapy [address] [place], [place], [address] Phone: [phone]   Fax:  [phone]  Name: Andrew Payne MRN: [id number] Date of Birth: [birth year]

## 2020-04-17 ENCOUNTER — Ambulatory Visit: Payer: PPO | Admitting: Orthopaedic Surgery

## 2020-04-17 ENCOUNTER — Encounter: Payer: Self-pay | Admitting: Orthopaedic Surgery

## 2020-04-17 ENCOUNTER — Encounter: Payer: Self-pay | Admitting: Rehabilitative and Restorative Service Providers"

## 2020-04-17 ENCOUNTER — Other Ambulatory Visit: Payer: Self-pay

## 2020-04-17 ENCOUNTER — Ambulatory Visit: Payer: PPO | Admitting: Rehabilitative and Restorative Service Providers"

## 2020-04-17 VITALS — Ht 74.0 in | Wt 181.0 lb

## 2020-04-17 DIAGNOSIS — M6281 Muscle weakness (generalized): Secondary | ICD-10-CM | POA: Diagnosis not present

## 2020-04-17 DIAGNOSIS — M25512 Pain in left shoulder: Secondary | ICD-10-CM

## 2020-04-17 DIAGNOSIS — M25612 Stiffness of left shoulder, not elsewhere classified: Secondary | ICD-10-CM

## 2020-04-17 DIAGNOSIS — M7542 Impingement syndrome of left shoulder: Secondary | ICD-10-CM

## 2020-04-17 DIAGNOSIS — G8929 Other chronic pain: Secondary | ICD-10-CM | POA: Diagnosis not present

## 2020-04-17 NOTE — Progress Notes (Signed)
Office Visit Note   Patient: Andrew Payne           Date of Birth: 02-16-1946           MRN: [id number] Visit Date: 04/17/2020              Requested by: Marton Redwood, MD [address] [place],   [address] PCP: Marton Redwood, MD   Assessment & Plan: Visit Diagnoses:  1. Impingement syndrome of left shoulder     Plan: Andrew Payne completed a course of physical therapy and relates that he has a bit better motion of his left shoulder but is not having much relief of his pain.  He had an MRI scan of his left shoulder last month performed without contrast.  Study demonstrated atypical high-grade partial bursal surface tear of the supraspinatus near the musculotendinous junction with prominent tendon retraction.  The additional components of the rotator cuff biceps tendon and labrum appeared to be intact.  He did have some degenerative changes at the [facility] joint and glenohumeral degenerative changes which were minimal.  Andrew Payne had cortisone injection course of therapy over-the-counter medicines and still hurts.  I think at this point surgery is indicated.  This would include an arthroscopic SCD and DCR and then I performed a mini open rotator cuff tear repair and/or exploration.  Long discussion regarding the surgery, patient nature and decision to what he may or may not expect.  We talked about period of immobilization need for follow-up physical therapy.  He like to proceed.  All questions were answered  Follow-Up Instructions: Return We will schedule arthroscopy left shoulder with mini open rotator cuff tear repair.   Orders:  No orders of the defined types were placed in this encounter.  No orders of the defined types were placed in this encounter.     Procedures: No procedures performed   Clinical Data: No additional findings.   Subjective: Chief Complaint  Patient presents with  . Left Shoulder - Pain  Patient presents today for a one month follow up on his left shoulder. He  has been in therapy since his last visit and states that it seems to have made him worse. He had his last visit today. He is not taking anything for pain.   HPI  Review of Systems   Objective: Vital Signs: Ht 6\' 2"  (1.88 m)   Wt 181 lb (82.1 kg)   BMI 23.24 kg/m   Physical Exam Constitutional:      Appearance: He is well-developed and well-nourished.  HENT:     Mouth/Throat:     Mouth: Oropharynx is clear and moist.  Eyes:     Extraocular Movements: EOM normal.     Pupils: Pupils are equal, round, and reactive to light.  Pulmonary:     Effort: Pulmonary effort is normal.  Skin:    General: Skin is warm and dry.  Neurological:     Mental Status: He is alert and oriented to person, place, and time.  Psychiatric:        Mood and Affect: Mood and affect normal.        Behavior: Behavior normal.     Ortho Exam awake alert and oriented x3.  Comfortable sitting.  Does have some pain with internal extra rotation of his left shoulder but able to place his arm fully overhead without much trouble.  Also was able to touch the middle of his back with his hand.  No evidence of instability or adhesive  capsulitis.  Skin intact.  Biceps intact.  Some tenderness along the anterior lateral subacromial region.  No crepitation  Specialty Comments:  No specialty comments available.  Imaging: No results found.   PMFS History: Patient Active Problem List   Diagnosis Date Noted  . Nontraumatic incomplete tear of left rotator cuff 03/13/2020  . Impingement syndrome of left shoulder 08/23/2019  . Unilateral primary osteoarthritis, right knee 11/01/2017  . Chest pain 08/07/2017  . Bradycardia 08/07/2017  . History of right tennis elbow 05/09/2017  . Right tennis elbow 04/07/2017  . Prolapsed internal hemorrhoids, grade 3 06/03/2016  . Hx of adenomatous colonic polyps 05/27/2016   Past Medical History:  Diagnosis Date  . Bradycardia    cardiology-- dr Curt Bears--  work-up done included  event monitor and echo (all in epic)  . Carotid artery stenosis    per pt H&P dated 10-20-2018 --  <40%  . Coronary atherosclerosis   . Glaucoma, right eye   . History of nuclear stress test 04/15/2005   (in epic)  for near syncope--- Low risk normal no ischemia, ef 57%  . Hx of adenomatous colonic polyps 05/27/2016  . Internal hemorrhoids   . Morton neuroma, left   . OA (osteoarthritis)    wrist, elbow, thumb, knees  . PVC's (premature ventricular contractions)   . RBBB (right bundle branch block)    States heart rate has been as low as 28 and asyptomatic    Family History  Problem Relation Age of Onset  . Heart disease Mother   . Alzheimer's disease Father   . Colon polyps Father   . Colon cancer Neg Hx   . Pancreatic cancer Neg Hx   . Prostate cancer Neg Hx   . Rectal cancer Neg Hx   . Esophageal cancer Neg Hx     Past Surgical History:  Procedure Laterality Date  . COLONOSCOPY  05/2016   TA  . ELBOW SURGERY Left 04/2017  . EXCISION MORTON'S NEUROMA Left 10/23/2018   Procedure: EXCISION MORTON'S NEUROMA LEFT FOOT;  Surgeon: Rosemary Holms, DPM;  Location: [place];  Service: Podiatry;  Laterality: Left;  . HEMORRHOID BANDING  2018  . INGUINAL HERNIA REPAIR Left 06/14/2017   Procedure: LEFT INGUINAL HERNIA REPAIR ERAS PATHWAY;  Surgeon: Erroll Luna, MD;  Location: [place];  Service: General;  Laterality: Left;  . INGUINAL HERNIA REPAIR Right 11-18-2009   dr Redmond Pulling  @[facility]   . INSERTION OF MESH Left 06/14/2017   Procedure: INSERTION OF MESH;  Surgeon: Erroll Luna, MD;  Location: [place];  Service: General;  Laterality: Left;  . KNEE ARTHROSCOPY W/ MENISCAL REPAIR Right 09/2015  . POLYPECTOMY    . TONSILLECTOMY AND ADENOIDECTOMY  child   Social History   Occupational History  . Not on file  Tobacco Use  . Smoking status: Never Smoker  . Smokeless tobacco: Never Used  Vaping Use  . Vaping Use: Never used   Substance and Sexual Activity  . Alcohol use: Not Currently  . Drug use: No  . Sexual activity: Not on file

## 2020-04-17 NOTE — Patient Instructions (Signed)
Access Code: [email or web address] URL: [email or web address] Date: 04/17/2020 Prepared by: Vista Mink  Exercises Standing Shoulder Internal Rotation Stretch with Hands Behind Back - 3 x daily - 7 x weekly - 1 sets - 10 reps - 10 seconds hold Supine Shoulder Internal Rotation Stretch - 2-3 x daily - 7 x weekly - 1 sets - 10-20 reps - 10 secondsinutes hold Supine Shoulder External Rotation Stretch - 2-3 x daily - 7 x weekly - 1 sets - 10-20 reps - 10 seconds hold Shoulder External Rotation with Anchored Resistance with Towel Under Elbow - 1 x daily - 3-7 x weekly - 2-3 sets - 10 reps - 3 hold Supine Scapular Protraction in Flexion with Dumbbells - 2-3 x daily - 7 x weekly - 1 sets - 20 reps - 3 seconds hold Standing Shoulder Posterior Capsule Stretch - 2-3 x daily - 7 x weekly - 1 sets - 10 reps - 10 seconds hold

## 2020-04-17 NOTE — Therapy (Addendum)
[facility] Physical Therapy [address] [place], [place], [address] Phone: [phone]   Fax:  [phone]  Physical Therapy Treatment/Discharge PHYSICAL THERAPY DISCHARGE SUMMARY  Visits from Start of Care: 8  Current functional level related to goals / functional outcomes: See below   Remaining deficits: See below   Education / Equipment: HEP, recommendation to follow up with MD about surgery Plan: Patient agrees to discharge.  Patient goals were not met. Patient is being discharged due to lack of progress.  ?????   He followed up with MD and will have RTC surgery.  Elsie Ra, PT, DPT 05/20/20 10:39 AM      Patient Details  Name: Andrew Payne MRN: [id number] Date of Birth: Sep 13, 1945 Referring Provider (PT): Garald Balding   Encounter Date: 04/17/2020   PT End of Session - 04/17/20 1622    Visit Number 8    Number of Visits 8    Date for PT Re-Evaluation 04/25/20    PT Start Time 1430    PT Stop Time 1515    PT Time Calculation (min) 45 min    Activity Tolerance No increased pain;Patient tolerated treatment well    Behavior During Therapy [facility] for tasks assessed/performed           Past Medical History:  Diagnosis Date  . Bradycardia    cardiology-- dr Curt Bears--  work-up done included event monitor and echo (all in epic)  . Carotid artery stenosis    per pt H&P dated 10-20-2018 --  <40%  . Coronary atherosclerosis   . Glaucoma, right eye   . History of nuclear stress test 04/15/2005   (in epic)  for near syncope--- Low risk normal no ischemia, ef 57%  . Hx of adenomatous colonic polyps 05/27/2016  . Internal hemorrhoids   . Morton neuroma, left   . OA (osteoarthritis)    wrist, elbow, thumb, knees  . PVC's (premature ventricular contractions)   . RBBB (right bundle branch block)    States heart rate has been as low as 28 and asyptomatic    Past Surgical History:  Procedure Laterality Date  . COLONOSCOPY  05/2016    TA  . ELBOW SURGERY Left 04/2017  . EXCISION MORTON'S NEUROMA Left 10/23/2018   Procedure: EXCISION MORTON'S NEUROMA LEFT FOOT;  Surgeon: Rosemary Holms, DPM;  Location: [place];  Service: Podiatry;  Laterality: Left;  . HEMORRHOID BANDING  2018  . INGUINAL HERNIA REPAIR Left 06/14/2017   Procedure: LEFT INGUINAL HERNIA REPAIR ERAS PATHWAY;  Surgeon: Erroll Luna, MD;  Location: [place];  Service: General;  Laterality: Left;  . INGUINAL HERNIA REPAIR Right 11-18-2009   dr Redmond Pulling  '@[facility]'   . INSERTION OF MESH Left 06/14/2017   Procedure: INSERTION OF MESH;  Surgeon: Erroll Luna, MD;  Location: [place];  Service: General;  Laterality: Left;  . KNEE ARTHROSCOPY W/ MENISCAL REPAIR Right 09/2015  . POLYPECTOMY    . TONSILLECTOMY AND ADENOIDECTOMY  child    There were no vitals filed for this visit.   Subjective Assessment - 04/17/20 1619    Subjective AROM is significantly improved, although capsular tightness persists.    Limitations Other (comment)   Golf and rolling in bed   Patient Stated Goals Complete normal UE activities without pain.    Currently in Pain? Yes    Pain Score 3     Pain Location Shoulder    Pain Orientation Left    Pain Descriptors /  Indicators Aching;Sore;Tightness    Pain Type Chronic pain    Pain Onset More than a month ago    Pain Frequency Intermittent    Aggravating Factors  Rolling ver at night, shirts on/off and impingement positions    Effect of Pain on Daily Activities Pain with bed mobility and end range AROM    Multiple Pain Sites No                             [facility] Adult PT Treatment/Exercise - 04/17/20 0001      Exercises   Exercises Shoulder      Shoulder Exercises: Supine   Protraction Strengthening;Both;20 reps    Protraction Weight (lbs) 5#    External Rotation AROM;Left;20 reps    External Rotation Limitations 10 SECONDS    Internal Rotation AROM;Left;20 reps    10 seconds     Shoulder Exercises: Sidelying   External Rotation Left    External Rotation Weight (lbs) 1#    External Rotation Limitations 2 SETS OF 10      Shoulder Exercises: Standing   Internal Rotation AROM;Left;10 reps    Theraband Level (Shoulder Internal Rotation) --   10 seconds thumb up back and posterior capsule stretch   Row Weight (lbs) Blue theraband 20X    Retraction Strengthening;Both;10 reps   5 seconds   Other Standing Exercises Shoulder blade pinches 10X 5 seconds                  PT Education - 04/17/20 1621    Education Details Updated and reviewed HEP.    Person(s) Educated Patient    Methods Explanation;Demonstration;Tactile cues;Verbal cues;Handout    Comprehension Verbal cues required;Need further instruction;Returned demonstration;Verbalized understanding;Tactile cues required               PT Long Term Goals - 04/17/20 1621      PT LONG TERM GOAL #1   Title Improve FOTO to 74.    Baseline 70    Time 4    Period Weeks    Status On-going      PT LONG TERM GOAL #2   Title Improve L shoulder AROM for flexion to 160; IR to 60; ER to 90 and HA to 40 degrees.    Baseline See objective    Time 4    Period Weeks    Status On-going      PT LONG TERM GOAL #3   Title Improve L shoulder strength as assessed by MMT, FOTO score and functional self-report.    Baseline See objective    Time 4    Period Weeks    Status On-going      PT LONG TERM GOAL #4   Title Improve L shoulder pain to consistently 0-3/10 on the Numeric Pain Rating Scale.    Baseline Can be a brief sharp 5-7/10 pain    Time 4    Period Weeks    Status On-going      PT LONG TERM GOAL #5   Title [name] will be independent with his long-term HEP at [place].    Time 4    Period Weeks    Status Achieved                 Plan - 04/17/20 1622    Clinical Impression Statement We reviewed and updated Dave's HEP today.  He may be a surgical candidate and is following-up with  Dr.  Whitfield this afternoon.  We will await Dr. Rudene Anda surgical recommendations before continuing supervised PT.    Examination-Activity Limitations Reach Overhead;Lift;Sleep    Examination-Participation Restrictions Community Activity    Stability/Clinical Decision Making Stable/Uncomplicated    Rehab Potential Good    PT Frequency 2x / week    PT Duration 4 weeks    PT Treatment/Interventions ADLs/Self Care Home Management;Moist Heat;Cryotherapy;Therapeutic activities;Therapeutic exercise;Neuromuscular re-education;Patient/family education;Manual techniques    PT Next Visit Plan Depends on MD    PT Home Exercise Plan [email or web address]    Consulted and Agree with Plan of Care Patient           Patient will benefit from skilled therapeutic intervention in order to improve the following deficits and impairments:  Decreased range of motion,Decreased strength,Impaired UE functional use,Pain  Visit Diagnosis: Stiffness of left shoulder, not elsewhere classified  Chronic left shoulder pain  Muscle weakness (generalized)     Problem List Patient Active Problem List   Diagnosis Date Noted  . Nontraumatic incomplete tear of left rotator cuff 03/13/2020  . Impingement syndrome of left shoulder 08/23/2019  . Unilateral primary osteoarthritis, right knee 11/01/2017  . Chest pain 08/07/2017  . Bradycardia 08/07/2017  . History of right tennis elbow 05/09/2017  . Right tennis elbow 04/07/2017  . Prolapsed internal hemorrhoids, grade 3 06/03/2016  . Hx of adenomatous colonic polyps 05/27/2016    Farley Ly PT, MPT 04/17/2020, 4:24 PM  [facility] Physical Therapy [address] [place], [place], [address] Phone: [phone]   Fax:  [phone]  Name: Andrew Payne MRN: [id number] Date of Birth: May 28, 1945

## 2020-04-21 ENCOUNTER — Telehealth: Payer: Self-pay | Admitting: Orthopaedic Surgery

## 2020-04-21 NOTE — Telephone Encounter (Signed)
Left message on patient's voicemail to call at his convenience to set up left shoulder with Dr. Cleophas Dunker.  Provided my name and direct number.

## 2020-05-01 ENCOUNTER — Encounter: Payer: Self-pay | Admitting: Physical Therapy

## 2020-05-08 ENCOUNTER — Other Ambulatory Visit: Payer: Self-pay | Admitting: Orthopaedic Surgery

## 2020-05-08 ENCOUNTER — Ambulatory Visit: Payer: PPO | Admitting: Orthopaedic Surgery

## 2020-05-08 DIAGNOSIS — M659 Synovitis and tenosynovitis, unspecified: Secondary | ICD-10-CM | POA: Diagnosis not present

## 2020-05-08 DIAGNOSIS — S46012A Strain of muscle(s) and tendon(s) of the rotator cuff of left shoulder, initial encounter: Secondary | ICD-10-CM | POA: Diagnosis not present

## 2020-05-08 DIAGNOSIS — M7542 Impingement syndrome of left shoulder: Secondary | ICD-10-CM | POA: Diagnosis not present

## 2020-05-08 DIAGNOSIS — M19012 Primary osteoarthritis, left shoulder: Secondary | ICD-10-CM | POA: Diagnosis not present

## 2020-05-08 DIAGNOSIS — S46012S Strain of muscle(s) and tendon(s) of the rotator cuff of left shoulder, sequela: Secondary | ICD-10-CM | POA: Diagnosis not present

## 2020-05-08 DIAGNOSIS — X58XXXA Exposure to other specified factors, initial encounter: Secondary | ICD-10-CM | POA: Diagnosis not present

## 2020-05-08 DIAGNOSIS — M65812 Other synovitis and tenosynovitis, left shoulder: Secondary | ICD-10-CM | POA: Diagnosis not present

## 2020-05-08 DIAGNOSIS — Y999 Unspecified external cause status: Secondary | ICD-10-CM | POA: Diagnosis not present

## 2020-05-08 MED ORDER — TRAMADOL HCL 50 MG PO TABS
100.0000 mg | ORAL_TABLET | Freq: Three times a day (TID) | ORAL | 0 refills | Status: DC | PRN
Start: 2020-05-08 — End: 2020-06-19

## 2020-05-08 MED ORDER — ONDANSETRON HCL 4 MG PO TABS: 4.0000 mg | ORAL_TABLET | Freq: Three times a day (TID) | ORAL | 0 refills | Status: DC | PRN

## 2020-05-15 ENCOUNTER — Other Ambulatory Visit: Payer: Self-pay

## 2020-05-15 ENCOUNTER — Ambulatory Visit (INDEPENDENT_AMBULATORY_CARE_PROVIDER_SITE_OTHER): Payer: PPO | Admitting: Orthopaedic Surgery

## 2020-05-15 ENCOUNTER — Encounter: Payer: Self-pay | Admitting: Orthopaedic Surgery

## 2020-05-15 DIAGNOSIS — M7542 Impingement syndrome of left shoulder: Secondary | ICD-10-CM

## 2020-05-15 DIAGNOSIS — M75112 Incomplete rotator cuff tear or rupture of left shoulder, not specified as traumatic: Secondary | ICD-10-CM

## 2020-05-15 NOTE — Progress Notes (Signed)
Office Visit Note   Patient: Andrew Payne           Date of Birth: [birth year]           MRN: [id number] Visit Date: 05/15/2020              Requested by: Marton Redwood, MD [address] [place],  [place] [address] PCP: Marton Redwood, MD   Assessment & Plan: Visit Diagnoses:  1. Impingement syndrome of left shoulder   2. Nontraumatic incomplete tear of left rotator cuff     Plan: 1 week status post left shoulder arthroscopic SCD, DCR and mini open rotator cuff tear repair.  Doing well.  Already able to actively abduct 90 degrees.  Interscalene block lasted almost 4 days.  No use of any pain pills.  I cleaned the wound.  No evidence of infection.  Start physical therapy for passive range of motion and return to office in 2 weeks  Follow-Up Instructions: Return in about 2 weeks (around 05/29/2020).   Orders:  Orders Placed This Encounter  Procedures  . Ambulatory referral to Physical Therapy   No orders of the defined types were placed in this encounter.     Procedures: No procedures performed   Clinical Data: No additional findings.   Subjective: Chief Complaint  Patient presents with  . Left Shoulder - Routine Post Op  Doing well 1 week post left shoulder surgery as above.  No fever chills shortness of breath or chest pain.  Neurologically intact  HPI  Review of Systems   Objective: Vital Signs: There were no vitals taken for this visit.  Physical Exam  Ortho Exam left shoulder incisions healing without problem good grip and good release.  He is able to abduct 90 degrees actively at which point there is some pain.  Ecchymosis as expected  Specialty Comments:  No specialty comments available.  Imaging: No results found.   PMFS History: Patient Active Problem List   Diagnosis Date Noted  . Nontraumatic incomplete tear of left rotator cuff 03/13/2020  . Impingement syndrome of left shoulder 08/23/2019  . Unilateral primary osteoarthritis, right knee  11/01/2017  . Chest pain 08/07/2017  . Bradycardia 08/07/2017  . History of right tennis elbow 05/09/2017  . Right tennis elbow 04/07/2017  . Prolapsed internal hemorrhoids, grade 3 06/03/2016  . Hx of adenomatous colonic polyps 05/27/2016   Past Medical History:  Diagnosis Date  . Bradycardia    cardiology-- dr Curt Bears--  work-up done included event monitor and echo (all in epic)  . Carotid artery stenosis    per pt H&P dated 10-20-2018 --  <40%  . Coronary atherosclerosis   . Glaucoma, right eye   . History of nuclear stress test 04/15/2005   (in epic)  for near syncope--- Low risk normal no ischemia, ef 57%  . Hx of adenomatous colonic polyps 05/27/2016  . Internal hemorrhoids   . Morton neuroma, left   . OA (osteoarthritis)    wrist, elbow, thumb, knees  . PVC's (premature ventricular contractions)   . RBBB (right bundle branch block)    States heart rate has been as low as 28 and asyptomatic    Family History  Problem Relation Age of Onset  . Heart disease Mother   . Alzheimer's disease Father   . Colon polyps Father   . Colon cancer Neg Hx   . Pancreatic cancer Neg Hx   . Prostate cancer Neg Hx   . Rectal cancer Neg Hx   .  Esophageal cancer Neg Hx     Past Surgical History:  Procedure Laterality Date  . COLONOSCOPY  05/2016   TA  . ELBOW SURGERY Left 04/2017  . EXCISION MORTON'S NEUROMA Left 10/23/2018   Procedure: EXCISION MORTON'S NEUROMA LEFT FOOT;  Surgeon: Rosemary Holms, DPM;  Location: [facility];  Service: Podiatry;  Laterality: Left;  . HEMORRHOID BANDING  2018  . INGUINAL HERNIA REPAIR Left 06/14/2017   Procedure: LEFT INGUINAL HERNIA REPAIR ERAS PATHWAY;  Surgeon: Erroll Luna, MD;  Location: [facility];  Service: General;  Laterality: Left;  . INGUINAL HERNIA REPAIR Right 11-18-2009   dr Redmond Pulling  @[facility]   . INSERTION OF MESH Left 06/14/2017   Procedure: INSERTION OF MESH;  Surgeon: Erroll Luna, MD;  Location: [facility];  Service: General;  Laterality: Left;  . KNEE ARTHROSCOPY W/ MENISCAL REPAIR Right 09/2015  . POLYPECTOMY    . TONSILLECTOMY AND ADENOIDECTOMY  child   Social History   Occupational History  . Not on file  Tobacco Use  . Smoking status: Never Smoker  . Smokeless tobacco: Never Used  Vaping Use  . Vaping Use: Never used  Substance and Sexual Activity  . Alcohol use: Not Currently  . Drug use: No  . Sexual activity: Not on file     Garald Balding, MD   Note - This record has been created using [employer].  Chart creation errors have been sought, but may not always  have been located. Such creation errors do not reflect on  the standard of medical care.

## 2020-05-20 ENCOUNTER — Ambulatory Visit: Payer: PPO | Admitting: Physical Therapy

## 2020-05-20 ENCOUNTER — Other Ambulatory Visit: Payer: Self-pay

## 2020-05-20 ENCOUNTER — Encounter: Payer: Self-pay | Admitting: Physical Therapy

## 2020-05-20 DIAGNOSIS — R600 Localized edema: Secondary | ICD-10-CM | POA: Diagnosis not present

## 2020-05-20 DIAGNOSIS — M25512 Pain in left shoulder: Secondary | ICD-10-CM | POA: Diagnosis not present

## 2020-05-20 DIAGNOSIS — M6281 Muscle weakness (generalized): Secondary | ICD-10-CM

## 2020-05-20 DIAGNOSIS — M25612 Stiffness of left shoulder, not elsewhere classified: Secondary | ICD-10-CM | POA: Diagnosis not present

## 2020-05-20 NOTE — Patient Instructions (Signed)
Access Code: [email or web address] URL: [email or web address] Date: 05/20/2020 Prepared by: Elsie Ra  Exercises Circular Shoulder Pendulum with Table Support - 2 x daily - 7 x weekly - 2 sets - 10 reps Towel Roll Squeeze - 2 x daily - 7 x weekly - 2 sets - 10 reps - 5 hold Seated Shoulder Abduction Towel Slide at Table Top - 2 x daily - 7 x weekly - 2 sets - 10 reps - 5 hold Seated Shoulder External Rotation PROM on Table - 2 x daily - 7 x weekly - 2 sets - 10 reps - 5 hold Seated Scapular Retraction - 2 x daily - 7 x weekly - 2 sets - 10 reps Supine Shoulder Flexion PROM - 2 x daily - 7 x weekly - 2 sets - 10 reps

## 2020-05-20 NOTE — Therapy (Addendum)
[facility] [place], [place], [address] Phone: [phone]   Fax:  [phone]  Physical Therapy Evaluation  Patient Details  Name: Andrew Payne MRN: [id number] Date of Birth: 07/01/1945 Referring Provider (PT): Joni Fears, MD   Encounter Date: 05/20/2020   PT End of Session - 05/20/20 1110    Visit Number 1    Number of Visits 20    Date for PT Re-Evaluation 07/29/20    PT Start Time 2542    PT Stop Time 1057    PT Time Calculation (min) 43 min    Activity Tolerance Patient tolerated treatment well;No increased pain    Behavior During Therapy WFL for tasks assessed/performed           Past Medical History:  Diagnosis Date  . Bradycardia    cardiology-- dr Curt Bears--  work-up done included event monitor and echo (all in epic)  . Carotid artery stenosis    per pt H&P dated 10-20-2018 --  <40%  . Coronary atherosclerosis   . Glaucoma, right eye   . History of nuclear stress test 04/15/2005   (in epic)  for near syncope--- Low risk normal no ischemia, ef 57%  . Hx of adenomatous colonic polyps 05/27/2016  . Internal hemorrhoids   . Morton neuroma, left   . OA (osteoarthritis)    wrist, elbow, thumb, knees  . PVC's (premature ventricular contractions)   . RBBB (right bundle branch block)    States heart rate has been as low as 28 and asyptomatic    Past Surgical History:  Procedure Laterality Date  . COLONOSCOPY  05/2016   TA  . ELBOW SURGERY Left 04/2017  . EXCISION MORTON'S NEUROMA Left 10/23/2018   Procedure: EXCISION MORTON'S NEUROMA LEFT FOOT;  Surgeon: Rosemary Holms, DPM;  Location: [facility];  Service: Podiatry;  Laterality: Left;  . HEMORRHOID BANDING  2018  . INGUINAL HERNIA REPAIR Left 06/14/2017   Procedure: LEFT INGUINAL HERNIA REPAIR ERAS PATHWAY;  Surgeon: Erroll Luna, MD;  Location: [facility];  Service: General;  Laterality: Left;  . INGUINAL HERNIA REPAIR  Right 11-18-2009   dr Redmond Pulling  @[facility]   . INSERTION OF MESH Left 06/14/2017   Procedure: INSERTION OF MESH;  Surgeon: Erroll Luna, MD;  Location: [facility];  Service: General;  Laterality: Left;  . KNEE ARTHROSCOPY W/ MENISCAL REPAIR Right 09/2015  . POLYPECTOMY    . TONSILLECTOMY AND ADENOIDECTOMY  child    There were no vitals filed for this visit.    Subjective Assessment - 05/20/20 1012    Subjective Patient reports he is doing very well overall.  Pt. is wearing sling when he goes out and in crowds to protect the arm and while he sleeps. He has started to take it off around the house and is experimenting with taking it off when he sleeps.  Patient has been doing pendulums and lifting the arm passively so far to maintain precautions for the arm. He has been taking only taking ibuprofen for pain but is minimal. Patient says he wakes up every couple of hours when sleeping but states this has been ongoing for years nad not due to pain. [place] also states he has been going for 3-4 mile walks but keeps sling on to minimize arm swing.    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 No/denies   no current pain but with stretching end range minimal pain   Pain Onset More than a month ago    Aggravating Factors  strething end range              [facility] PT Assessment - 05/20/20 0001      Assessment   Medical Diagnosis L shoulder RTC repair    Referring Provider (PT) Joni Fears, MD    Onset Date/Surgical Date 05/08/20    Hand Dominance Right    Next MD Visit 05/29/2020      Precautions   Precautions Shoulder    Type of Shoulder Precautions no flexion or IR limitations, careful with abd,ER      Restrictions   Weight Bearing Restrictions --   no active motion until week 5 2/17, no weight until week 6 06/19/20     Balance Screen   Has the  patient fallen in the past 6 months No    Has the patient had a decrease in activity level because of a fear of falling?  No    Is the patient reluctant to leave their home because of a fear of falling?  No      Home Ecologist residence      Prior Function   Level of Independence Independent    Leisure golf, volunteer work performing construciton      Cognition   Overall Cognitive Status Within Functional Limits for tasks assessed      Observation/Other Assessments   Observations skin has slight bruising, wound is healing well and has closed. No redness or abnormal swelling.      ROM / Strength   AROM / PROM / Strength PROM      PROM   PROM Assessment Site Shoulder    Right/Left Shoulder Left    Left Shoulder Flexion 117 Degrees    Left Shoulder ABduction 97 Degrees    Left Shoulder Internal Rotation --   WNL   Left Shoulder External Rotation 60 Degrees      Strength   Overall Strength Comments deferred due to post op precautions                      Objective measurements completed on examination: See above findings.       [facility] Adult PT Treatment/Exercise - 05/20/20 0001      Manual Therapy   Manual therapy comments Lt shoulder PROM gentle all planes 15 min                  PT Education - 05/20/20 1109    Education Details POC, surgery precautions, HEP    Person(s) Educated Patient    Methods Explanation;Demonstration;Handout    Comprehension Verbalized understanding            PT Short Term Goals - 05/20/20 1129      PT SHORT TERM GOAL #1   Title Patient will be I with HEP    Time 2    Period Weeks    Status New    Target Date 06/03/20      PT SHORT TERM GOAL #2   Title Patient will improve PROM to at least 140 flexion, ER 80, 140 abduction for improved function    Baseline flex- 117, ER-60, 121-abd    Time 4    Period Weeks    Status New    Target Date 06/17/20  PT Long Term  Goals - 05/20/20 1121      PT LONG TERM GOAL #1   Title Patient will demonstrate increased ROM to within functional use for returning to leisure activities    Baseline PAtient is unable to move actively due to precuations, PROM is lacking in all motions exept IR    Time 10    Period Weeks    Status New    Target Date 07/29/20      PT LONG TERM GOAL #2   Title Improve shoulder strength to offset atrophy from surgery to be back to 5/5 in all motions for increased ability to lift/carry/use tools    Baseline unable to assess    Time 10    Period Weeks    Status New    Target Date 07/29/20      PT LONG TERM GOAL #3   Title Patient will have increased ROM to be able to swing a golf club with proper body mechanics to play a round of golf    Baseline unable to do    Time 10    Period Weeks    Status New    Target Date 07/29/20      PT LONG TERM GOAL #4   Title Patient will be able to complete overhead activities without pain over 2/10 on NPS or limitation for ability to return to volunteer work    Baseline unable to do    Time 10    Period Weeks    Status New    Target Date 07/29/20                  Plan - 05/20/20 1111    Clinical Impression Statement Patient presents 75 y/o male 2 days shy of 2 weeks post op RTC repair on Lt shoulder. Patient's pain is well managed and has been doing some passive motion and pendulums to increase his motion. Patient's ROM is advanced for this stage but still limited in all planes passively. He demonstrates good IR motion. Patient's skin integrity looks good with residual bruising on anterior aspect of the shoulder. The incision is healing well and mostly closed and no redness or edema surrounding it. Patient will benefit from skilled PT to progress his passive motion and work towards starting more active assisted motion and isometrics around week 3 in the coming weeks to start to increase muscle activation before starting a strenghtening program  around week 6. He will likely be ready for AROM around week 5 as he states his tear/repair was more with the direction of the fibers instead of against so that MD said his recovery would be easier and faster than the traditional RTC repair protocol.    Personal Factors and Comorbidities Age    Examination-Activity Limitations Reach Overhead;Lift;Carry    Examination-Participation Restrictions Cleaning;Community Activity;Volunteer    Stability/Clinical Decision Making Stable/Uncomplicated    Clinical Decision Making Low    Rehab Potential Excellent    PT Frequency 2x / week    PT Duration Other (comment)   10 weeks   PT Treatment/Interventions ADLs/Self Care Home Management;Functional mobility training;Therapeutic activities;Therapeutic exercise;Neuromuscular re-education;Manual techniques;Passive range of motion;Vasopneumatic Device;Cryotherapy;Electrical Stimulation;Iontophoresis 4mg /ml Dexamethasone;Moist Heat;Ultrasound;Taping;Joint Manipulations;Dry needling    PT Next Visit Plan Check in with HEP, continue PROM, shift to [facility], plan for isometrics week 3 (05/29/20) AAROM week 4 (2/10), likely will be ready for AROM week 5 (2/17), no resistance to week 6 (2/24).    PT Home Exercise Plan Access Code:  [email or web address]    Consulted and Agree with Plan of Care Patient           Patient will benefit from skilled therapeutic intervention in order to improve the following deficits and impairments:  Decreased range of motion,Impaired flexibility,Decreased mobility,Decreased strength,Impaired UE functional use  Visit Diagnosis: Stiffness of left shoulder, not elsewhere classified - Plan: PT plan of care cert/re-cert  Acute pain of left shoulder - Plan: PT plan of care cert/re-cert  Muscle weakness (generalized) - Plan: PT plan of care cert/re-cert  Localized edema     Problem List Patient Active Problem List   Diagnosis Date Noted  . Nontraumatic incomplete tear of left rotator cuff 03/13/2020   . Impingement syndrome of left shoulder 08/23/2019  . Unilateral primary osteoarthritis, right knee 11/01/2017  . Chest pain 08/07/2017  . Bradycardia 08/07/2017  . History of right tennis elbow 05/09/2017  . Right tennis elbow 04/07/2017  . Prolapsed internal hemorrhoids, grade 3 06/03/2016  . Hx of adenomatous colonic polyps 05/27/2016    Glenetta Hew, SPT 05/20/2020, 12:32 PM   During this treatment session, this physical therapist was present, participating in and directing the treatment.   This note has been reviewed and this clinician agrees with the information provided.  Elsie Ra, PT, DPT 05/20/20 12:32 PM   [facility] Physical Therapy [address] [place], [place], [address] Phone: [phone]   Fax:  [phone]  Name: Andrew Payne MRN: [id number] Date of Birth: 1946-01-11

## 2020-05-23 ENCOUNTER — Encounter: Payer: Self-pay | Admitting: Physical Therapy

## 2020-05-23 ENCOUNTER — Ambulatory Visit: Payer: PPO | Admitting: Physical Therapy

## 2020-05-23 ENCOUNTER — Other Ambulatory Visit: Payer: Self-pay

## 2020-05-23 DIAGNOSIS — M6281 Muscle weakness (generalized): Secondary | ICD-10-CM

## 2020-05-23 DIAGNOSIS — R600 Localized edema: Secondary | ICD-10-CM | POA: Diagnosis not present

## 2020-05-23 DIAGNOSIS — M25612 Stiffness of left shoulder, not elsewhere classified: Secondary | ICD-10-CM | POA: Diagnosis not present

## 2020-05-23 DIAGNOSIS — G8929 Other chronic pain: Secondary | ICD-10-CM | POA: Diagnosis not present

## 2020-05-23 DIAGNOSIS — M25512 Pain in left shoulder: Secondary | ICD-10-CM

## 2020-05-23 NOTE — Therapy (Signed)
[facility] Physical Therapy [address] [place], [place], [address] Phone: [phone]   Fax:  [phone]  Physical Therapy Treatment  Patient Details  Name: Andrew Payne MRN: [id number] Date of Birth: 1946/04/22 Referring Provider (PT): Joni Fears, MD   Encounter Date: 05/23/2020   PT End of Session - 05/23/20 1619    Visit Number 2    Number of Visits 20    Date for PT Re-Evaluation 07/29/20    PT Start Time 3295    PT Stop Time 1600    PT Time Calculation (min) 45 min    Activity Tolerance Patient tolerated treatment well;No increased pain    Behavior During Therapy WFL for tasks assessed/performed           Past Medical History:  Diagnosis Date  . Bradycardia    cardiology-- dr Curt Bears--  work-up done included event monitor and echo (all in epic)  . Carotid artery stenosis    per pt H&P dated 10-20-2018 --  <40%  . Coronary atherosclerosis   . Glaucoma, right eye   . History of nuclear stress test 04/15/2005   (in epic)  for near syncope--- Low risk normal no ischemia, ef 57%  . Hx of adenomatous colonic polyps 05/27/2016  . Internal hemorrhoids   . Morton neuroma, left   . OA (osteoarthritis)    wrist, elbow, thumb, knees  . PVC's (premature ventricular contractions)   . RBBB (right bundle branch block)    States heart rate has been as low as 28 and asyptomatic    Past Surgical History:  Procedure Laterality Date  . COLONOSCOPY  05/2016   TA  . ELBOW SURGERY Left 04/2017  . EXCISION MORTON'S NEUROMA Left 10/23/2018   Procedure: EXCISION MORTON'S NEUROMA LEFT FOOT;  Surgeon: Rosemary Holms, DPM;  Location: [facility];  Service: Podiatry;  Laterality: Left;  . HEMORRHOID BANDING  2018  . INGUINAL HERNIA REPAIR Left 06/14/2017   Procedure: LEFT INGUINAL HERNIA REPAIR ERAS PATHWAY;  Surgeon: Erroll Luna, MD;  Location: [facility];  Service: General;  Laterality: Left;  . INGUINAL HERNIA REPAIR Right  11-18-2009   dr Redmond Pulling  @[facility]   . INSERTION OF MESH Left 06/14/2017   Procedure: INSERTION OF MESH;  Surgeon: Erroll Luna, MD;  Location: [facility];  Service: General;  Laterality: Left;  . KNEE ARTHROSCOPY W/ MENISCAL REPAIR Right 09/2015  . POLYPECTOMY    . TONSILLECTOMY AND ADENOIDECTOMY  child    There were no vitals filed for this visit.   Subjective Assessment - 05/23/20 1559    Subjective he denies pain upon arrival. He tried to sleep without the sling but was more uncomfortable so he is still sleeping with it on.    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, golfing    Pain Onset More than a month ago                 [facility] Adult PT Treatment/Exercise - 05/23/20 0001      Blood Flow Restriction   Blood Flow Restriction Yes      Blood Flow Restriction-Positions    Blood Flow Restriction Position Sitting      BFR Sitting   Sitting Limb Occulsion Pressure (mmHg) 115    Sitting Exercise Pressure (mmHg) 60    Sitting Exercise Prescription 30,15,15,15, reps w/ 30-60 sec rest    Sitting Exercise  Prescription Comment gripping towel, and elbow flexion AROM      Exercises   Exercises Shoulder      Shoulder Exercises: Seated   Other Seated Exercises wrist extension and pronation/supination with 2# ball X 20 ea      Shoulder Exercises: Standing   Retraction AROM;20 reps    Other Standing Exercises posterior shoulder rolls X 20 reps    Other Standing Exercises pendulums X 20 CW,CCW      Shoulder Exercises: Pulleys   Flexion 2 minutes    Flexion Limitations PROM    ABduction 2 minutes    ABduction Limitations PROM      Manual Therapy   Manual therapy comments Lt shoulder PROM gentle all planes 10 min with BFR 60 mmHG                    PT Short Term Goals - 05/20/20 1129      PT SHORT TERM GOAL #1   Title Patient will be I with HEP     Time 2    Period Weeks    Status New    Target Date 06/03/20      PT SHORT TERM GOAL #2   Title Patient will improve PROM to at least 140 flexion, ER 80, 140 abduction for improved function    Baseline flex- 117, ER-60, 121-abd    Time 4    Period Weeks    Status New    Target Date 06/17/20             PT Long Term Goals - 05/20/20 1121      PT LONG TERM GOAL #1   Title Patient will demonstrate increased ROM to within functional use for returning to leisure activities    Baseline PAtient is unable to move actively due to precuations, PROM is lacking in all motions exept IR    Time 10    Period Weeks    Status New    Target Date 07/29/20      PT LONG TERM GOAL #2   Title Improve shoulder strength to offset atrophy from surgery to be back to 5/5 in all motions for increased ability to lift/carry/use tools    Baseline unable to assess    Time 10    Period Weeks    Status New    Target Date 07/29/20      PT LONG TERM GOAL #3   Title Patient will have increased ROM to be able to swing a golf club with proper body mechanics to play a round of golf    Baseline unable to do    Time 10    Period Weeks    Status New    Target Date 07/29/20      PT LONG TERM GOAL #4   Title Patient will be able to complete overhead activities without pain over 2/10 on NPS or limitation for ability to return to volunteer work    Baseline unable to do    Time 10    Period Weeks    Status New    Target Date 07/29/20                 Plan - 05/23/20 1620    Clinical Impression Statement Session focused on PROM with addition of BFR during PROM and elbow/wrist AROM to facilitate gentle early strength gains. He showed good overall tolerance to session and his  ROM is progressing well. Begin isometrics next week.  Personal Factors and Comorbidities Age    Examination-Activity Limitations Reach Overhead;Lift;Carry    Examination-Participation Restrictions Cleaning;Community  Activity;Volunteer    Stability/Clinical Decision Making Stable/Uncomplicated    Rehab Potential Excellent    PT Frequency 2x / week    PT Duration Other (comment)   10 weeks   PT Treatment/Interventions ADLs/Self Care Home Management;Functional mobility training;Therapeutic activities;Therapeutic exercise;Neuromuscular re-education;Manual techniques;Passive range of motion;Vasopneumatic Device;Cryotherapy;Electrical Stimulation;Iontophoresis 4mg /ml Dexamethasone;Moist Heat;Ultrasound;Taping;Joint Manipulations;Dry needling    PT Next Visit Plan Check in with HEP, continue PROM, shift to [facility], plan for isometrics week 3 (05/29/20) AAROM week 4 (2/10), likely will be ready for AROM week 5 (2/17), no resistance to week 6 (2/24).    PT Home Exercise Plan Access Code: [email or web address]    Consulted and Agree with Plan of Care Patient           Patient will benefit from skilled therapeutic intervention in order to improve the following deficits and impairments:  Decreased range of motion,Impaired flexibility,Decreased mobility,Decreased strength,Impaired UE functional use  Visit Diagnosis: Acute pain of left shoulder  Stiffness of left shoulder, not elsewhere classified  Muscle weakness (generalized)  Localized edema  Chronic left shoulder pain     Problem List Patient Active Problem List   Diagnosis Date Noted  . Nontraumatic incomplete tear of left rotator cuff 03/13/2020  . Impingement syndrome of left shoulder 08/23/2019  . Unilateral primary osteoarthritis, right knee 11/01/2017  . Chest pain 08/07/2017  . Bradycardia 08/07/2017  . History of right tennis elbow 05/09/2017  . Right tennis elbow 04/07/2017  . Prolapsed internal hemorrhoids, grade 3 06/03/2016  . Hx of adenomatous colonic polyps 05/27/2016    Andrew Payne 05/23/2020, 4:22 PM  [facility] Physical Therapy [address] [place], [place], [address] Phone: [phone]   Fax:   [phone]  Name: Andrew Payne MRN: [id number] Date of Birth: 1945-08-10

## 2020-05-28 ENCOUNTER — Other Ambulatory Visit: Payer: Self-pay

## 2020-05-28 ENCOUNTER — Ambulatory Visit: Payer: PPO | Admitting: Physical Therapy

## 2020-05-28 DIAGNOSIS — R600 Localized edema: Secondary | ICD-10-CM

## 2020-05-28 DIAGNOSIS — M25512 Pain in left shoulder: Secondary | ICD-10-CM

## 2020-05-28 DIAGNOSIS — M25612 Stiffness of left shoulder, not elsewhere classified: Secondary | ICD-10-CM | POA: Diagnosis not present

## 2020-05-28 DIAGNOSIS — M6281 Muscle weakness (generalized): Secondary | ICD-10-CM

## 2020-05-28 NOTE — Therapy (Signed)
[facility] Physical Therapy [address] [place], [place], [address] Phone: [phone]   Fax:  [phone]  Physical Therapy Treatment  Patient Details  Name: Andrew Payne MRN: [id number] Date of Birth: [birth year] Referring Provider (PT): Joni Fears, MD   Encounter Date: 05/28/2020   PT End of Session - 05/28/20 1439    Visit Number 3    Number of Visits 20    Date for PT Re-Evaluation 07/29/20    PT Start Time 6644    PT Stop Time 1427    PT Time Calculation (min) 42 min    Activity Tolerance Patient tolerated treatment well;No increased pain    Behavior During Therapy WFL for tasks assessed/performed           Past Medical History:  Diagnosis Date  . Bradycardia    cardiology-- dr Curt Bears--  work-up done included event monitor and echo (all in epic)  . Carotid artery stenosis    per pt H&P dated 10-20-2018 --  <40%  . Coronary atherosclerosis   . Glaucoma, right eye   . History of nuclear stress test 04/15/2005   (in epic)  for near syncope--- Low risk normal no ischemia, ef 57%  . Hx of adenomatous colonic polyps 05/27/2016  . Internal hemorrhoids   . Morton neuroma, left   . OA (osteoarthritis)    wrist, elbow, thumb, knees  . PVC's (premature ventricular contractions)   . RBBB (right bundle branch block)    States heart rate has been as low as 28 and asyptomatic    Past Surgical History:  Procedure Laterality Date  . COLONOSCOPY  05/2016   TA  . ELBOW SURGERY Left 04/2017  . EXCISION MORTON'S NEUROMA Left 10/23/2018   Procedure: EXCISION MORTON'S NEUROMA LEFT FOOT;  Surgeon: Rosemary Holms, DPM;  Location: [place];  Service: Podiatry;  Laterality: Left;  . HEMORRHOID BANDING  2018  . INGUINAL HERNIA REPAIR Left 06/14/2017   Procedure: LEFT INGUINAL HERNIA REPAIR ERAS PATHWAY;  Surgeon: Erroll Luna, MD;  Location: [place];  Service: General;  Laterality: Left;  . INGUINAL HERNIA REPAIR Right  11-18-2009   dr Redmond Pulling  @[facility]   . INSERTION OF MESH Left 06/14/2017   Procedure: INSERTION OF MESH;  Surgeon: Erroll Luna, MD;  Location: [place];  Service: General;  Laterality: Left;  . KNEE ARTHROSCOPY W/ MENISCAL REPAIR Right 09/2015  . POLYPECTOMY    . TONSILLECTOMY AND ADENOIDECTOMY  child    There were no vitals filed for this visit.   Subjective Assessment - 05/28/20 1438    Subjective he relays his Lt shoulder is sore and achy but he does not feel he is overdoing it with PT.    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, golfing    Pain Onset More than a month ago              [facility] PT Assessment - 05/28/20 0001      Assessment   Medical Diagnosis L shoulder RTC repair    Referring Provider (PT) Joni Fears, MD    Onset Date/Surgical Date 05/08/20      PROM   Left Shoulder Flexion 150 Degrees    Left Shoulder ABduction 160 Degrees   into slight scaption   Left Shoulder Internal Rotation 65 Degrees   at 60 deg abd   Left Shoulder External Rotation 75 Degrees  [facility] Adult PT Treatment/Exercise - 05/28/20 0001      Blood Flow Restriction   Blood Flow Restriction Yes   Cuff size 2     Shoulder Exercises: Standing   Retraction AROM    Retraction Limitations with BFR 70 mmHG 30 reps, 15, 15    Other Standing Exercises Lt elbow flexion AROM with BFR 70 mmHG 30 reps, 15, 15    Other Standing Exercises pendulums X 20 CW,CCW      Shoulder Exercises: Pulleys   Flexion 2 minutes    Flexion Limitations PROM, with BFR 60 mmHG    ABduction 2 minutes    ABduction Limitations PROM, PROM with BFR 60 mmHG      Shoulder Exercises: Isometric Strengthening   Other Isometric Exercises gentle submax isometrics 1 sec hold X 30 reps for flexion, abd, IR,ER with BFR 70 mmHG      Manual Therapy   Manual therapy comments Lt shoulder PROM gentle  all planes 10 min with BFR 60 mmHG                    PT Short Term Goals - 05/20/20 1129      PT SHORT TERM GOAL #1   Title Patient will be I with HEP    Time 2    Period Weeks    Status New    Target Date 06/03/20      PT SHORT TERM GOAL #2   Title Patient will improve PROM to at least 140 flexion, ER 80, 140 abduction for improved function    Baseline flex- 117, ER-60, 121-abd    Time 4    Period Weeks    Status New    Target Date 06/17/20             PT Long Term Goals - 05/20/20 1121      PT LONG TERM GOAL #1   Title Patient will demonstrate increased ROM to within functional use for returning to leisure activities    Baseline PAtient is unable to move actively due to precuations, PROM is lacking in all motions exept IR    Time 10    Period Weeks    Status New    Target Date 07/29/20      PT LONG TERM GOAL #2   Title Improve shoulder strength to offset atrophy from surgery to be back to 5/5 in all motions for increased ability to lift/carry/use tools    Baseline unable to assess    Time 10    Period Weeks    Status New    Target Date 07/29/20      PT LONG TERM GOAL #3   Title Patient will have increased ROM to be able to swing a golf club with proper body mechanics to play a round of golf    Baseline unable to do    Time 10    Period Weeks    Status New    Target Date 07/29/20      PT LONG TERM GOAL #4   Title Patient will be able to complete overhead activities without pain over 2/10 on NPS or limitation for ability to return to volunteer work    Baseline unable to do    Time 10    Period Weeks    Status New    Target Date 07/29/20                 Plan - 05/28/20 1440  Clinical Impression Statement He had good tolreance to PROM and this is improving very well post op, see updated measurements above. Began gentle isometrics today without pain and he shows good understanding of this so this was added into HEP at home. He will  follow up with MD tommorow.    Personal Factors and Comorbidities Age    Examination-Activity Limitations Reach Overhead;Lift;Carry    Examination-Participation Restrictions Cleaning;Community Activity;Volunteer    Stability/Clinical Decision Making Stable/Uncomplicated    Rehab Potential Excellent    PT Frequency 2x / week    PT Duration Other (comment)   10 weeks   PT Treatment/Interventions ADLs/Self Care Home Management;Functional mobility training;Therapeutic activities;Therapeutic exercise;Neuromuscular re-education;Manual techniques;Passive range of motion;Vasopneumatic Device;Cryotherapy;Electrical Stimulation;Iontophoresis 4mg /ml Dexamethasone;Moist Heat;Ultrasound;Taping;Joint Manipulations;Dry needling    PT Next Visit Plan what did MD say? plan for AAROM week 4 (2/10), likely will be ready for AROM week 5 (2/17), no resistance to week 6 (2/24).    PT Home Exercise Plan Access Code: [email or web address], added gentle isometrics and he states he did not need pictures for these    Consulted and Agree with Plan of Care Patient           Patient will benefit from skilled therapeutic intervention in order to improve the following deficits and impairments:  Decreased range of motion,Impaired flexibility,Decreased mobility,Decreased strength,Impaired UE functional use  Visit Diagnosis: Acute pain of left shoulder  Stiffness of left shoulder, not elsewhere classified  Muscle weakness (generalized)  Localized edema     Problem List Patient Active Problem List   Diagnosis Date Noted  . Nontraumatic incomplete tear of left rotator cuff 03/13/2020  . Impingement syndrome of left shoulder 08/23/2019  . Unilateral primary osteoarthritis, right knee 11/01/2017  . Chest pain 08/07/2017  . Bradycardia 08/07/2017  . History of right tennis elbow 05/09/2017  . Right tennis elbow 04/07/2017  . Prolapsed internal hemorrhoids, grade 3 06/03/2016  . Hx of adenomatous colonic polyps 05/27/2016     Debbe Odea, PT,DPT 05/28/2020, 2:43 PM  [facility] Physical Therapy [address] [place], [place], [address] Phone: [phone]   Fax:  [phone]  Name: Andrew Payne MRN: [id number] Date of Birth: 24-Feb-1946

## 2020-05-29 ENCOUNTER — Encounter: Payer: Self-pay | Admitting: Orthopaedic Surgery

## 2020-05-29 ENCOUNTER — Ambulatory Visit (INDEPENDENT_AMBULATORY_CARE_PROVIDER_SITE_OTHER): Payer: PPO | Admitting: Orthopaedic Surgery

## 2020-05-29 VITALS — Ht 74.0 in | Wt 181.0 lb

## 2020-05-29 DIAGNOSIS — M75112 Incomplete rotator cuff tear or rupture of left shoulder, not specified as traumatic: Secondary | ICD-10-CM

## 2020-05-29 NOTE — Progress Notes (Signed)
Office Visit Note   Patient: Andrew Payne           Date of Birth: June 07, 1945           MRN: [id number] Visit Date: 05/29/2020              Requested by: Marton Redwood, MD [address] [place],  [place] [address] PCP: Marton Redwood, MD   Assessment & Plan: Visit Diagnoses:  1. Nontraumatic incomplete tear of left rotator cuff     Plan: 3 weeks status post rotator cuff tear repair left shoulder with an SCD and DCR.  There was an area of localized atrophy and a vascularity of the cuff with a central full-thickness tear.  This was ellipsed and then sewn back.  Good tissue both proximally and distally.  Going to physical therapy and wearing the sling.  Doing well.  Still has some achiness and soreness as expected.  Continue therapy for passive motion and progress per protocol return in 3 weeks  Follow-Up Instructions: Return in about 3 weeks (around 06/19/2020).   Orders:  No orders of the defined types were placed in this encounter.  No orders of the defined types were placed in this encounter.     Procedures: No procedures performed   Clinical Data: No additional findings.   Subjective: Chief Complaint  Patient presents with  . Left Shoulder - Follow-up    Left shoulder scope 05/08/20  Patient presents today for follow on his left shoulder. He had a left shoulder arthroscopy with SCD, DCR, and rotator cuff tear repair on 05/08/2020. He is now 3 weeks out from surgery. He is going to physical therapy here twice weekly. He takes Ibuprofen as needed. No complaints. Still wearing is sling.   HPI  Review of Systems   Objective: Vital Signs: Ht 6\' 2"  (1.88 m)   Wt 181 lb (82.1 kg)   BMI 23.24 kg/m   Physical Exam  Ortho Exam left shoulder incisions healing without problem.  Good grip and good release.  Actually has excellent range of motion passively considering its only been 3 weeks.  Biceps intact.  Skin intact  Specialty Comments:  No specialty comments  available.  Imaging: No results found.   PMFS History: Patient Active Problem List   Diagnosis Date Noted  . Nontraumatic incomplete tear of left rotator cuff 03/13/2020  . Impingement syndrome of left shoulder 08/23/2019  . Unilateral primary osteoarthritis, right knee 11/01/2017  . Chest pain 08/07/2017  . Bradycardia 08/07/2017  . History of right tennis elbow 05/09/2017  . Right tennis elbow 04/07/2017  . Prolapsed internal hemorrhoids, grade 3 06/03/2016  . Hx of adenomatous colonic polyps 05/27/2016   Past Medical History:  Diagnosis Date  . Bradycardia    cardiology-- dr Curt Bears--  work-up done included event monitor and echo (all in epic)  . Carotid artery stenosis    per pt H&P dated 10-20-2018 --  <40%  . Coronary atherosclerosis   . Glaucoma, right eye   . History of nuclear stress test 04/15/2005   (in epic)  for near syncope--- Low risk normal no ischemia, ef 57%  . Hx of adenomatous colonic polyps 05/27/2016  . Internal hemorrhoids   . Morton neuroma, left   . OA (osteoarthritis)    wrist, elbow, thumb, knees  . PVC's (premature ventricular contractions)   . RBBB (right bundle branch block)    States heart rate has been as low as 28 and asyptomatic  Family History  Problem Relation Age of Onset  . Heart disease Mother   . Alzheimer's disease Father   . Colon polyps Father   . Colon cancer Neg Hx   . Pancreatic cancer Neg Hx   . Prostate cancer Neg Hx   . Rectal cancer Neg Hx   . Esophageal cancer Neg Hx     Past Surgical History:  Procedure Laterality Date  . COLONOSCOPY  05/2016   TA  . ELBOW SURGERY Left 04/2017  . EXCISION MORTON'S NEUROMA Left 10/23/2018   Procedure: EXCISION MORTON'S NEUROMA LEFT FOOT;  Surgeon: Rosemary Holms, DPM;  Location: [place];  Service: Podiatry;  Laterality: Left;  . HEMORRHOID BANDING  2018  . INGUINAL HERNIA REPAIR Left 06/14/2017   Procedure: LEFT INGUINAL HERNIA REPAIR ERAS PATHWAY;  Surgeon:  Erroll Luna, MD;  Location: [place];  Service: General;  Laterality: Left;  . INGUINAL HERNIA REPAIR Right 11-18-2009   dr Redmond Pulling  @[facility]   . INSERTION OF MESH Left 06/14/2017   Procedure: INSERTION OF MESH;  Surgeon: Erroll Luna, MD;  Location: [place];  Service: General;  Laterality: Left;  . KNEE ARTHROSCOPY W/ MENISCAL REPAIR Right 09/2015  . POLYPECTOMY    . TONSILLECTOMY AND ADENOIDECTOMY  child   Social History   Occupational History  . Not on file  Tobacco Use  . Smoking status: Never Smoker  . Smokeless tobacco: Never Used  Vaping Use  . Vaping Use: Never used  Substance and Sexual Activity  . Alcohol use: Not Currently  . Drug use: No  . Sexual activity: Not on file

## 2020-05-30 ENCOUNTER — Ambulatory Visit: Payer: PPO | Admitting: Physical Therapy

## 2020-05-30 ENCOUNTER — Other Ambulatory Visit: Payer: Self-pay

## 2020-05-30 DIAGNOSIS — M25512 Pain in left shoulder: Secondary | ICD-10-CM

## 2020-05-30 DIAGNOSIS — M6281 Muscle weakness (generalized): Secondary | ICD-10-CM

## 2020-05-30 DIAGNOSIS — R600 Localized edema: Secondary | ICD-10-CM | POA: Diagnosis not present

## 2020-05-30 DIAGNOSIS — M25612 Stiffness of left shoulder, not elsewhere classified: Secondary | ICD-10-CM

## 2020-05-30 NOTE — Therapy (Signed)
[facility] Physical Therapy [address] [place], [place], [address] Phone: [phone]   Fax:  [phone]  Physical Therapy Treatment  Patient Details  Name: Andrew Payne MRN: [id number] Date of Birth: 1945/06/29 Referring Provider (PT): Joni Fears, MD   Encounter Date: 05/30/2020   PT End of Session - 05/30/20 1435    Visit Number 4    Number of Visits 20    Date for PT Re-Evaluation 07/29/20    PT Start Time 3762    PT Stop Time 1427    PT Time Calculation (min) 42 min    Activity Tolerance Patient tolerated treatment well;No increased pain    Behavior During Therapy WFL for tasks assessed/performed           Past Medical History:  Diagnosis Date  . Bradycardia    cardiology-- dr Curt Bears--  work-up done included event monitor and echo (all in epic)  . Carotid artery stenosis    per pt H&P dated 10-20-2018 --  <40%  . Coronary atherosclerosis   . Glaucoma, right eye   . History of nuclear stress test 04/15/2005   (in epic)  for near syncope--- Low risk normal no ischemia, ef 57%  . Hx of adenomatous colonic polyps 05/27/2016  . Internal hemorrhoids   . Morton neuroma, left   . OA (osteoarthritis)    wrist, elbow, thumb, knees  . PVC's (premature ventricular contractions)   . RBBB (right bundle branch block)    States heart rate has been as low as 28 and asyptomatic    Past Surgical History:  Procedure Laterality Date  . COLONOSCOPY  05/2016   TA  . ELBOW SURGERY Left 04/2017  . EXCISION MORTON'S NEUROMA Left 10/23/2018   Procedure: EXCISION MORTON'S NEUROMA LEFT FOOT;  Surgeon: Rosemary Holms, DPM;  Location: [place];  Service: Podiatry;  Laterality: Left;  . HEMORRHOID BANDING  2018  . INGUINAL HERNIA REPAIR Left 06/14/2017   Procedure: LEFT INGUINAL HERNIA REPAIR ERAS PATHWAY;  Surgeon: Erroll Luna, MD;  Location: [place];  Service: General;  Laterality: Left;  . INGUINAL HERNIA REPAIR Right  11-18-2009   dr Redmond Pulling  @[facility]   . INSERTION OF MESH Left 06/14/2017   Procedure: INSERTION OF MESH;  Surgeon: Erroll Luna, MD;  Location: [place];  Service: General;  Laterality: Left;  . KNEE ARTHROSCOPY W/ MENISCAL REPAIR Right 09/2015  . POLYPECTOMY    . TONSILLECTOMY AND ADENOIDECTOMY  child    There were no vitals filed for this visit.   Subjective Assessment - 05/30/20 1433    Subjective he relays his Lt shoulder is doing good, he says that MD was pleased with progress so far.    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, golfing    Pain Onset More than a month ago             [facility] Adult PT Treatment/Exercise - 05/30/20 0001      Blood Flow Restriction   Blood Flow Restriction Yes   Cuff size 2     Shoulder Exercises: Standing   Retraction AROM    Retraction Limitations with BFR 70 mmHG 30 reps, 15, 15    Other Standing Exercises Lt elbow flexion AROM with BFR 70 mmHG 30 reps, 15, 15    Other Standing Exercises pendulums X 20 CW,CCW      Shoulder Exercises: Pulleys  Flexion 2 minutes    Flexion Limitations PROM    ABduction 2 minutes    ABduction Limitations PROM      Shoulder Exercises: Isometric Strengthening   Other Isometric Exercises gentle submax isometrics 1 sec hold X 30 reps for flexion, abd, IR,ER with BFR 70 mmHG      Manual Therapy   Manual therapy comments Lt shoulder PROM gentle all planes 10 min with BFR 60 mmHG                    PT Short Term Goals - 05/20/20 1129      PT SHORT TERM GOAL #1   Title Patient will be I with HEP    Time 2    Period Weeks    Status New    Target Date 06/03/20      PT SHORT TERM GOAL #2   Title Patient will improve PROM to at least 140 flexion, ER 80, 140 abduction for improved function    Baseline flex- 117, ER-60, 121-abd    Time 4    Period Weeks    Status New    Target  Date 06/17/20             PT Long Term Goals - 05/20/20 1121      PT LONG TERM GOAL #1   Title Patient will demonstrate increased ROM to within functional use for returning to leisure activities    Baseline PAtient is unable to move actively due to precuations, PROM is lacking in all motions exept IR    Time 10    Period Weeks    Status New    Target Date 07/29/20      PT LONG TERM GOAL #2   Title Improve shoulder strength to offset atrophy from surgery to be back to 5/5 in all motions for increased ability to lift/carry/use tools    Baseline unable to assess    Time 10    Period Weeks    Status New    Target Date 07/29/20      PT LONG TERM GOAL #3   Title Patient will have increased ROM to be able to swing a golf club with proper body mechanics to play a round of golf    Baseline unable to do    Time 10    Period Weeks    Status New    Target Date 07/29/20      PT LONG TERM GOAL #4   Title Patient will be able to complete overhead activities without pain over 2/10 on NPS or 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 We will progress to more AAROM next week as he will be 4 weeks post op then.    Personal Factors and Comorbidities Age    Examination-Activity Limitations Reach Overhead;Lift;Carry    Examination-Participation Restrictions Cleaning;Community Activity;Volunteer    Stability/Clinical Decision Making Stable/Uncomplicated    Rehab Potential Excellent    PT Frequency 2x / week    PT Duration Other (comment)   10 weeks   PT Treatment/Interventions ADLs/Self Care Home Management;Functional mobility training;Therapeutic activities;Therapeutic exercise;Neuromuscular  re-education;Manual techniques;Passive range of  motion;Vasopneumatic Device;Cryotherapy;Electrical Stimulation;Iontophoresis 4mg /ml Dexamethasone;Moist Heat;Ultrasound;Taping;Joint Manipulations;Dry needling    PT Next Visit Plan plan for AAROM week 4 (2/10), likely will be ready for AROM week 5 (2/17), no resistance to week 6 (2/24).    PT Home Exercise Plan Access Code: [email or web address], added gentle isometrics and he states he did not need pictures for these    Consulted and Agree with Plan of Care Patient           Patient will benefit from skilled therapeutic intervention in order to improve the following deficits and impairments:  Decreased range of motion,Impaired flexibility,Decreased mobility,Decreased strength,Impaired UE functional use  Visit Diagnosis: Acute pain of left shoulder  Stiffness of left shoulder, not elsewhere classified  Muscle weakness (generalized)  Localized edema     Problem List Patient Active Problem List   Diagnosis Date Noted  . Nontraumatic incomplete tear of left rotator cuff 03/13/2020  . Impingement syndrome of left shoulder 08/23/2019  . Unilateral primary osteoarthritis, right knee 11/01/2017  . Chest pain 08/07/2017  . Bradycardia 08/07/2017  . History of right tennis elbow 05/09/2017  . Right tennis elbow 04/07/2017  . Prolapsed internal hemorrhoids, grade 3 06/03/2016  . Hx of adenomatous colonic polyps 05/27/2016    Debbe Odea, PT,DPT 05/30/2020, 2:37 PM  [facility] Physical Therapy [address] [place], [place], [address] Phone: [phone]   Fax:  [phone]  Name: GABRIELLA VANDENBOSCH MRN: [id number] Date of Birth: June 07, 1945

## 2020-06-02 ENCOUNTER — Ambulatory Visit: Payer: PPO | Admitting: Physical Therapy

## 2020-06-02 ENCOUNTER — Other Ambulatory Visit: Payer: Self-pay

## 2020-06-02 DIAGNOSIS — M6281 Muscle weakness (generalized): Secondary | ICD-10-CM | POA: Diagnosis not present

## 2020-06-02 DIAGNOSIS — R600 Localized edema: Secondary | ICD-10-CM

## 2020-06-02 DIAGNOSIS — M25612 Stiffness of left shoulder, not elsewhere classified: Secondary | ICD-10-CM

## 2020-06-02 DIAGNOSIS — M25512 Pain in left shoulder: Secondary | ICD-10-CM | POA: Diagnosis not present

## 2020-06-02 NOTE — Therapy (Signed)
[place], [place], [address] Phone: [phone]   Fax:  [phone]  Physical Therapy Treatment  Patient Details  Name: Andrew Payne MRN: [id number] Date of Birth: [birth year] Referring Provider (PT): Joni Fears, MD   Encounter Date: 06/02/2020   PT End of Session - 06/02/20 1113    Visit Number 5    Number of Visits 20    Date for PT Re-Evaluation 07/29/20    PT Start Time 9528    PT Stop Time 4132    PT Time Calculation (min) 43 min    Activity Tolerance Patient tolerated treatment well;No increased pain    Behavior During Therapy WFL for tasks assessed/performed           Past Medical History:  Diagnosis Date  . Bradycardia    cardiology-- dr Curt Bears--  work-up done included event monitor and echo (all in epic)  . Carotid artery stenosis    per pt H&P dated 10-20-2018 --  <40%  . Coronary atherosclerosis   . Glaucoma, right eye   . History of nuclear stress test 04/15/2005   (in epic)  for near syncope--- Low risk normal no ischemia, ef 57%  . Hx of adenomatous colonic polyps 05/27/2016  . Internal hemorrhoids   . Morton neuroma, left   . OA (osteoarthritis)    wrist, elbow, thumb, knees  . PVC's (premature ventricular contractions)   . RBBB (right bundle branch block)    States heart rate has been as low as 28 and asyptomatic    Past Surgical History:  Procedure Laterality Date  . COLONOSCOPY  05/2016   TA  . ELBOW SURGERY Left 04/2017  . EXCISION MORTON'S NEUROMA Left 10/23/2018   Procedure: EXCISION MORTON'S NEUROMA LEFT FOOT;  Surgeon: Rosemary Holms, DPM;  Location: [facility];  Service: Podiatry;  Laterality: Left;  . HEMORRHOID BANDING  2018  . INGUINAL HERNIA REPAIR Left 06/14/2017   Procedure: LEFT INGUINAL HERNIA REPAIR ERAS PATHWAY;  Surgeon: Erroll Luna, MD;  Location: [facility];  Service: General;  Laterality: Left;  . INGUINAL HERNIA REPAIR Right  11-18-2009   dr Redmond Pulling  @[facility]   . INSERTION OF MESH Left 06/14/2017   Procedure: INSERTION OF MESH;  Surgeon: Erroll Luna, MD;  Location: [facility];  Service: General;  Laterality: Left;  . KNEE ARTHROSCOPY W/ MENISCAL REPAIR Right 09/2015  . POLYPECTOMY    . TONSILLECTOMY AND ADENOIDECTOMY  child    There were no vitals filed for this visit.   Subjective Assessment - 06/02/20 1028    Subjective he relays his Lt shoulder is more of an ache and sore more than pain. He tried self BFR training at home with theraband that PT recommended and he had good overall tolerance to this he reports.    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, golfing    Pain Onset More than a month ago            [facility] Adult PT Treatment/Exercise - 06/02/20 0001      Blood Flow Restriction   Blood Flow Restriction --   cuff size 2     Shoulder Exercises: Standing   External Rotation AAROM;Left    External Rotation Limitations 30, 15, 15 with BFR 70 mmHG    Flexion Left;AAROM    Flexion Limitations 30, 15, 15 with BFR 70 mmHG  ABduction Left;AAROM    ABduction Limitations 30, 15, 15 with BFR 70 mmHG    Other Standing Exercises Lt elbow flexion 2# AROM with BFR 70 mmHG 30 reps, 15, 15    Other Standing Exercises golf putting simulaiton X 12 reps      Shoulder Exercises: Pulleys   Flexion 2 minutes    ABduction 2 minutes      Shoulder Exercises: Isometric Strengthening   Other Isometric Exercises gentle submax isometrics 1 sec hold X 30 reps for flexion, abd, IR,ER with BFR 70 mmHG      Manual Therapy   Manual therapy comments Lt shoulder PROM gentle all planes 8 min with BFR 60 mmHG                    PT Short Term Goals - 05/20/20 1129      PT SHORT TERM GOAL #1   Title Patient will be I with HEP    Time 2    Period Weeks    Status New    Target Date 06/03/20       PT SHORT TERM GOAL #2   Title Patient will improve PROM to at least 140 flexion, ER 80, 140 abduction for improved function    Baseline flex- 117, ER-60, 121-abd    Time 4    Period Weeks    Status New    Target Date 06/17/20             PT Long Term Goals - 05/20/20 1121      PT LONG TERM GOAL #1   Title Patient will demonstrate increased ROM to within functional use for returning to leisure activities    Baseline PAtient is unable to move actively due to precuations, PROM is lacking in all motions exept IR    Time 10    Period Weeks    Status New    Target Date 07/29/20      PT LONG TERM GOAL #2   Title Improve shoulder strength to offset atrophy from surgery to be back to 5/5 in all motions for increased ability to lift/carry/use tools    Baseline unable to assess    Time 10    Period Weeks    Status New    Target Date 07/29/20      PT LONG TERM GOAL #3   Title Patient will have increased ROM to be able to swing a golf club with proper body mechanics to play a round of golf    Baseline unable to do    Time 10    Period Weeks    Status New    Target Date 07/29/20      PT LONG TERM GOAL #4   Title Patient will be able to complete overhead activities without pain over 2/10 on NPS or limitation for ability to return to volunteer work    Baseline unable to do    Time 10    Period Weeks    Status New    Target Date 07/29/20                 Plan - 06/02/20 1115    Clinical Impression Statement Progressed to [facility] today and he showed good understanding of this. Continued to remind him no AROM in his left shoulder until next week when we will try to introduce this gradually.    Personal Factors and Comorbidities Age    Examination-Activity Limitations Reach Overhead;Lift;Carry    Examination-Participation  Restrictions Cleaning;Community Activity;Volunteer    Stability/Clinical Decision Making Stable/Uncomplicated    Rehab Potential Excellent    PT  Frequency 2x / week    PT Duration Other (comment)   10 weeks   PT Treatment/Interventions ADLs/Self Care Home Management;Functional mobility training;Therapeutic activities;Therapeutic exercise;Neuromuscular re-education;Manual techniques;Passive range of motion;Vasopneumatic Device;Cryotherapy;Electrical Stimulation;Iontophoresis 4mg /ml Dexamethasone;Moist Heat;Ultrasound;Taping;Joint Manipulations;Dry needling    PT Next Visit Plan plan for AAROM week 4 (2/10), likely will be ready for AROM week 5 (2/17), no resistance to week 6 (2/24).    PT Home Exercise Plan Access Code: [email or web address], also gentle isometrics and he states he did not need pictures for these    Consulted and Agree with Plan of Care Patient           Patient will benefit from skilled therapeutic intervention in order to improve the following deficits and impairments:  Decreased range of motion,Impaired flexibility,Decreased mobility,Decreased strength,Impaired UE functional use  Visit Diagnosis: Acute pain of left shoulder  Stiffness of left shoulder, not elsewhere classified  Muscle weakness (generalized)  Localized edema     Problem List Patient Active Problem List   Diagnosis Date Noted  . Nontraumatic incomplete tear of left rotator cuff 03/13/2020  . Impingement syndrome of left shoulder 08/23/2019  . Unilateral primary osteoarthritis, right knee 11/01/2017  . Chest pain 08/07/2017  . Bradycardia 08/07/2017  . History of right tennis elbow 05/09/2017  . Right tennis elbow 04/07/2017  . Prolapsed internal hemorrhoids, grade 3 06/03/2016  . Hx of adenomatous colonic polyps 05/27/2016    Debbe Odea, PT,DPT 06/02/2020, 11:19 AM  [facility] Physical Therapy [address] [place], [place], [address] Phone: [phone]   Fax:  [phone]  Name: Andrew Payne MRN: [id number] Date of Birth: Nov 27, 1945

## 2020-06-04 ENCOUNTER — Encounter: Payer: Self-pay | Admitting: Physical Therapy

## 2020-06-04 ENCOUNTER — Ambulatory Visit: Payer: PPO | Admitting: Physical Therapy

## 2020-06-04 ENCOUNTER — Other Ambulatory Visit: Payer: Self-pay

## 2020-06-04 DIAGNOSIS — M25612 Stiffness of left shoulder, not elsewhere classified: Secondary | ICD-10-CM

## 2020-06-04 DIAGNOSIS — M6281 Muscle weakness (generalized): Secondary | ICD-10-CM

## 2020-06-04 DIAGNOSIS — R600 Localized edema: Secondary | ICD-10-CM | POA: Diagnosis not present

## 2020-06-04 DIAGNOSIS — M25512 Pain in left shoulder: Secondary | ICD-10-CM | POA: Diagnosis not present

## 2020-06-04 NOTE — Therapy (Signed)
[place], [place], [address] Phone: [phone]   Fax:  [phone]  Physical Therapy Treatment  Patient Details  Name: Andrew Payne MRN: [id number] Date of Birth: March 25, 1946 Referring Provider (PT): Joni Fears, MD   Encounter Date: 06/04/2020   PT End of Session - 06/04/20 1145    Visit Number 6    Number of Visits 20    Date for PT Re-Evaluation 07/29/20    PT Start Time 1057    PT Stop Time 1142    PT Time Calculation (min) 45 min    Activity Tolerance Patient tolerated treatment well;No increased pain    Behavior During Therapy WFL for tasks assessed/performed           Past Medical History:  Diagnosis Date  . Bradycardia    cardiology-- dr Curt Bears--  work-up done included event monitor and echo (all in epic)  . Carotid artery stenosis    per pt H&P dated 10-20-2018 --  <40%  . Coronary atherosclerosis   . Glaucoma, right eye   . History of nuclear stress test 04/15/2005   (in epic)  for near syncope--- Low risk normal no ischemia, ef 57%  . Hx of adenomatous colonic polyps 05/27/2016  . Internal hemorrhoids   . Morton neuroma, left   . OA (osteoarthritis)    wrist, elbow, thumb, knees  . PVC's (premature ventricular contractions)   . RBBB (right bundle branch block)    States heart rate has been as low as 28 and asyptomatic    Past Surgical History:  Procedure Laterality Date  . COLONOSCOPY  05/2016   TA  . ELBOW SURGERY Left 04/2017  . EXCISION MORTON'S NEUROMA Left 10/23/2018   Procedure: EXCISION MORTON'S NEUROMA LEFT FOOT;  Surgeon: Rosemary Holms, DPM;  Location: [facility];  Service: Podiatry;  Laterality: Left;  . HEMORRHOID BANDING  2018  . INGUINAL HERNIA REPAIR Left 06/14/2017   Procedure: LEFT INGUINAL HERNIA REPAIR ERAS PATHWAY;  Surgeon: Erroll Luna, MD;  Location: [facility];  Service: General;  Laterality: Left;  . INGUINAL HERNIA REPAIR Right  11-18-2009   dr Redmond Pulling  @[facility]   . INSERTION OF MESH Left 06/14/2017   Procedure: INSERTION OF MESH;  Surgeon: Erroll Luna, MD;  Location: [facility];  Service: General;  Laterality: Left;  . KNEE ARTHROSCOPY W/ MENISCAL REPAIR Right 09/2015  . POLYPECTOMY    . TONSILLECTOMY AND ADENOIDECTOMY  child    There were no vitals filed for this visit.   Subjective Assessment - 06/04/20 1103    Subjective he relays his shoulder is a little more sore but its doing well overall.    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, golfing    Pain Onset More than a month ago                 [facility] Adult PT Treatment/Exercise - 06/04/20 0001      Blood Flow Restriction   Blood Flow Restriction Yes   cuff size 2     Shoulder Exercises: Standing   External Rotation AAROM;Left    External Rotation Limitations 30, 15,  with BFR 70 mmHG    Flexion Left;AAROM    Flexion Limitations 30, 15,  with BFR 70 mmHG    ABduction Left;AAROM    ABduction Limitations 30, 15,  with BFR 70 mmHG  Retraction AROM    Retraction Limitations with BFR 70 mmHG 30 reps, 15, 15    Other Standing Exercises Lt elbow flexion 3# AROM with BFR 70 mmHG 30 reps, 15, 15      Shoulder Exercises: Pulleys   Flexion 2 minutes    ABduction 2 minutes      Shoulder Exercises: ROM/Strengthening   UBE (Upper Arm Bike) 2 min fwd, 2 min reverse no resistance AAROM                    PT Short Term Goals - 05/20/20 1129      PT SHORT TERM GOAL #1   Title Patient will be I with HEP    Time 2    Period Weeks    Status New    Target Date 06/03/20      PT SHORT TERM GOAL #2   Title Patient will improve PROM to at least 140 flexion, ER 80, 140 abduction for improved function    Baseline flex- 117, ER-60, 121-abd    Time 4    Period Weeks    Status New    Target Date 06/17/20             PT  Long Term Goals - 05/20/20 1121      PT LONG TERM GOAL #1   Title Patient will demonstrate increased ROM to within functional use for returning to leisure activities    Baseline PAtient is unable to move actively due to precuations, PROM is lacking in all motions exept IR    Time 10    Period Weeks    Status New    Target Date 07/29/20      PT LONG TERM GOAL #2   Title Improve shoulder strength to offset atrophy from surgery to be back to 5/5 in all motions for increased ability to lift/carry/use tools    Baseline unable to assess    Time 10    Period Weeks    Status New    Target Date 07/29/20      PT LONG TERM GOAL #3   Title Patient will have increased ROM to be able to swing a golf club with proper body mechanics to play a round of golf    Baseline unable to do    Time 10    Period Weeks    Status New    Target Date 07/29/20      PT LONG TERM GOAL #4   Title Patient will be able to complete overhead activities without pain over 2/10 on NPS or limitation for ability to return to volunteer work    Baseline unable to do    Time 10    Period Weeks    Status New    Target Date 07/29/20                 Plan - 06/04/20 1146    Clinical Impression Statement Continued with gentle isometrics and AAROM with good tolerance. He is doing very well post op and we will plant to try AROM starting 06/12/20.    Personal Factors and Comorbidities Age    Examination-Activity Limitations Reach Overhead;Lift;Carry    Examination-Participation Restrictions Cleaning;Community Activity;Volunteer    Stability/Clinical Decision Making Stable/Uncomplicated    Rehab Potential Excellent    PT Frequency 2x / week    PT Duration Other (comment)   10 weeks   PT Treatment/Interventions ADLs/Self Care Home Management;Functional mobility training;Therapeutic activities;Therapeutic exercise;Neuromuscular re-education;Manual techniques;Passive range  of motion;Vasopneumatic  Device;Cryotherapy;Electrical Stimulation;Iontophoresis 4mg /ml Dexamethasone;Moist Heat;Ultrasound;Taping;Joint Manipulations;Dry needling    PT Next Visit Plan likely will be ready for AROM week 5 (2/17), no resistance to week 6 (2/24).    PT Home Exercise Plan Access Code: [email or web address], also gentle isometrics and he states he did not need pictures for these    Consulted and Agree with Plan of Care Patient           Patient will benefit from skilled therapeutic intervention in order to improve the following deficits and impairments:  Decreased range of motion,Impaired flexibility,Decreased mobility,Decreased strength,Impaired UE functional use  Visit Diagnosis: Acute pain of left shoulder  Stiffness of left shoulder, not elsewhere classified  Muscle weakness (generalized)  Localized edema     Problem List Patient Active Problem List   Diagnosis Date Noted  . Nontraumatic incomplete tear of left rotator cuff 03/13/2020  . Impingement syndrome of left shoulder 08/23/2019  . Unilateral primary osteoarthritis, right knee 11/01/2017  . Chest pain 08/07/2017  . Bradycardia 08/07/2017  . History of right tennis elbow 05/09/2017  . Right tennis elbow 04/07/2017  . Prolapsed internal hemorrhoids, grade 3 06/03/2016  . Hx of adenomatous colonic polyps 05/27/2016    Debbe Odea, PT,DPT 06/04/2020, 11:50 AM  [facility] Physical Therapy [address] [place], [place], [address] Phone: [phone]   Fax:  [phone]  Name: Andrew Payne MRN: [id number] Date of Birth: 10-May-1945

## 2020-06-10 ENCOUNTER — Encounter: Payer: PPO | Admitting: Physical Therapy

## 2020-06-13 ENCOUNTER — Ambulatory Visit: Payer: PPO | Admitting: Physical Therapy

## 2020-06-13 ENCOUNTER — Other Ambulatory Visit: Payer: Self-pay

## 2020-06-13 ENCOUNTER — Encounter: Payer: PPO | Admitting: Physical Therapy

## 2020-06-13 DIAGNOSIS — M25512 Pain in left shoulder: Secondary | ICD-10-CM

## 2020-06-13 DIAGNOSIS — M6281 Muscle weakness (generalized): Secondary | ICD-10-CM | POA: Diagnosis not present

## 2020-06-13 DIAGNOSIS — M25612 Stiffness of left shoulder, not elsewhere classified: Secondary | ICD-10-CM | POA: Diagnosis not present

## 2020-06-13 DIAGNOSIS — R600 Localized edema: Secondary | ICD-10-CM | POA: Diagnosis not present

## 2020-06-13 NOTE — Therapy (Signed)
[place], [place], [address] Phone: [phone]   Fax:  [phone]  Physical Therapy Treatment  Patient Details  Name: Andrew Payne MRN: [id number] Date of Birth: 31-May-1945 Referring Provider (PT): Joni Fears, MD   Encounter Date: 06/13/2020   PT End of Session - 06/13/20 1354    Visit Number 7    Number of Visits 20    Date for PT Re-Evaluation 07/29/20    PT Start Time 1300    PT Stop Time 7517    PT Time Calculation (min) 49 min    Activity Tolerance Patient tolerated treatment well;No increased pain    Behavior During Therapy WFL for tasks assessed/performed           Past Medical History:  Diagnosis Date  . Bradycardia    cardiology-- dr Curt Bears--  work-up done included event monitor and echo (all in epic)  . Carotid artery stenosis    per pt H&P dated 10-20-2018 --  <40%  . Coronary atherosclerosis   . Glaucoma, right eye   . History of nuclear stress test 04/15/2005   (in epic)  for near syncope--- Low risk normal no ischemia, ef 57%  . Hx of adenomatous colonic polyps 05/27/2016  . Internal hemorrhoids   . Morton neuroma, left   . OA (osteoarthritis)    wrist, elbow, thumb, knees  . PVC's (premature ventricular contractions)   . RBBB (right bundle branch block)    States heart rate has been as low as 28 and asyptomatic    Past Surgical History:  Procedure Laterality Date  . COLONOSCOPY  05/2016   TA  . ELBOW SURGERY Left 04/2017  . EXCISION MORTON'S NEUROMA Left 10/23/2018   Procedure: EXCISION MORTON'S NEUROMA LEFT FOOT;  Surgeon: Rosemary Holms, DPM;  Location: [facility];  Service: Podiatry;  Laterality: Left;  . HEMORRHOID BANDING  2018  . INGUINAL HERNIA REPAIR Left 06/14/2017   Procedure: LEFT INGUINAL HERNIA REPAIR ERAS PATHWAY;  Surgeon: Erroll Luna, MD;  Location: [facility];  Service: General;  Laterality: Left;  . INGUINAL HERNIA REPAIR Right  11-18-2009   dr Redmond Pulling  '@[facility]'   . INSERTION OF MESH Left 06/14/2017   Procedure: INSERTION OF MESH;  Surgeon: Erroll Luna, MD;  Location: [facility];  Service: General;  Laterality: Left;  . KNEE ARTHROSCOPY W/ MENISCAL REPAIR Right 09/2015  . POLYPECTOMY    . TONSILLECTOMY AND ADENOIDECTOMY  child    There were no vitals filed for this visit.   Subjective Assessment - 06/13/20 1311    Subjective about 3/10 pain overall in his shoulder, still cant sleep on that side.    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, golfing    Pain Onset More than a month ago              [facility] PT Assessment - 06/13/20 0001      Assessment   Medical Diagnosis L shoulder RTC repair    Referring Provider (PT) Joni Fears, MD    Onset Date/Surgical Date 05/08/20      AROM   AROM Assessment Site Shoulder    Right/Left Shoulder Left    Left Shoulder Flexion 130 Degrees    Left Shoulder ABduction 135 Degrees    Left Shoulder Internal Rotation --   L1 behind back, can get to T10 with other side  Left Shoulder External Rotation --   T1 behind head, on his Rt he can get to T3         Lt shoulder PROM flexion to 160 deg.               [facility] Adult PT Treatment/Exercise - 06/13/20 0001      Shoulder Exercises: Sidelying   External Rotation AROM;Left    External Rotation Limitations with BFR 70 mmHG 30, 15, 15    ABduction Left;AROM    ABduction Limitations with BFR 70 mmHG 30, 15, 15      Shoulder Exercises: Standing   Flexion Both;AROM    Flexion Limitations 15, 15, 15 with BFR 70 mmHG      Shoulder Exercises: Pulleys   Flexion 2 minutes    ABduction 2 minutes      Shoulder Exercises: ROM/Strengthening   UBE (Upper Arm Bike) 2.5 min fwd, 2.5 min retro with BFR    [place] X 15 reps for flexion, abduction, cirlces in flexion CW,CCW    Other  ROM/Strengthening Exercises wall ladder X 5 flexion and scaption      Manual Therapy   Manual therapy comments Lt shoulder PROM gentle all planes 8 min with BFR 70 mmHG      Prosthetics   Prosthetic Care Comments  a                    PT Short Term Goals - 06/13/20 1357      PT SHORT TERM GOAL #1   Title Patient will be I with HEP    Baseline met    Time 2    Period Weeks    Status Achieved    Target Date 06/03/20      PT SHORT TERM GOAL #2   Title Patient will improve PROM to at least 140 flexion, ER 80, 140 abduction for improved function    Baseline flex- 117, ER-60, 121-abd    Time 4    Period Weeks    Status New    Target Date 06/17/20             PT Long Term Goals - 05/20/20 1121      PT LONG TERM GOAL #1   Title Patient will demonstrate increased ROM to within functional use for returning to leisure activities    Baseline PAtient is unable to move actively due to precuations, PROM is lacking in all motions exept IR    Time 10    Period Weeks    Status New    Target Date 07/29/20      PT LONG TERM GOAL #2   Title Improve shoulder strength to offset atrophy from surgery to be back to 5/5 in all motions for increased ability to lift/carry/use tools    Baseline unable to assess    Time 10    Period Weeks    Status New    Target Date 07/29/20      PT LONG TERM GOAL #3   Title Patient will have increased ROM to be able to swing a golf club with proper body mechanics to play a round of golf    Baseline unable to do    Time 10    Period Weeks    Status New    Target Date 07/29/20      PT LONG TERM GOAL #4   Title Patient will be able to complete overhead activities without pain over 2/10 on NPS or limitation  for ability to return to volunteer work    Baseline unable to do    Time 10    Period Weeks    Status New    Target Date 07/29/20                 Plan - 06/13/20 1355    Clinical Impression Statement began AROM today and he  had good tolerance to this. Took AROM baseline measurments and he showed improvements in PROM. May try to begin light resistance next week if he can tolerate this.    Personal Factors and Comorbidities Age    Examination-Activity Limitations Reach Overhead;Lift;Carry    Examination-Participation Restrictions Cleaning;Community Activity;Volunteer    Stability/Clinical Decision Making Stable/Uncomplicated    Rehab Potential Excellent    PT Frequency 2x / week    PT Duration Other (comment)   10 weeks   PT Treatment/Interventions ADLs/Self Care Home Management;Functional mobility training;Therapeutic activities;Therapeutic exercise;Neuromuscular re-education;Manual techniques;Passive range of motion;Vasopneumatic Device;Cryotherapy;Electrical Stimulation;Iontophoresis 79m/ml Dexamethasone;Moist Heat;Ultrasound;Taping;Joint Manipulations;Dry needling    PT Next Visit Plan lAROM and may try light  resistance to week 6 (2/24).    PT Home Exercise Plan Access Code: [email or web address] also gentle isometrics and he states he did not need pictures for these    Consulted and Agree with Plan of Care Patient           Patient will benefit from skilled therapeutic intervention in order to improve the following deficits and impairments:  Decreased range of motion,Impaired flexibility,Decreased mobility,Decreased strength,Impaired UE functional use  Visit Diagnosis: Acute pain of left shoulder  Stiffness of left shoulder, not elsewhere classified  Muscle weakness (generalized)  Localized edema     Problem List Patient Active Problem List   Diagnosis Date Noted  . Nontraumatic incomplete tear of left rotator cuff 03/13/2020  . Impingement syndrome of left shoulder 08/23/2019  . Unilateral primary osteoarthritis, right knee 11/01/2017  . Chest pain 08/07/2017  . Bradycardia 08/07/2017  . History of right tennis elbow 05/09/2017  . Right tennis elbow 04/07/2017  . Prolapsed internal hemorrhoids, grade 3  06/03/2016  . Hx of adenomatous colonic polyps 05/27/2016    BSilvestre Mesi2/18/2022, 1:57 PM  [facility] [address] [place] [address]Phone: [phone]  Fax:  [phone] Name: Andrew SAYMRN: [id number] of Birth: [birth year]

## 2020-06-13 NOTE — Patient Instructions (Signed)
Access Code: [email or web address] URL: [email or web address] Date: 06/13/2020 Prepared by: Elsie Ra  Exercises Standing shoulder flexion wall slides - 2 x daily - 6 x weekly - 10 reps - 1-2 sets Standing Shoulder Flexion to 90 Degrees - 2 x daily - 6 x weekly - 3 sets - 10 reps Standing Shoulder Abduction Full Range - 2 x daily - 6 x weekly - 3 sets - 10 reps Sidelying Shoulder External Rotation - 2 x daily - 6 x weekly - 3 sets - 15 reps

## 2020-06-17 ENCOUNTER — Encounter: Payer: Self-pay | Admitting: Physical Therapy

## 2020-06-17 ENCOUNTER — Other Ambulatory Visit: Payer: Self-pay

## 2020-06-17 ENCOUNTER — Ambulatory Visit: Payer: PPO | Admitting: Physical Therapy

## 2020-06-17 DIAGNOSIS — R600 Localized edema: Secondary | ICD-10-CM

## 2020-06-17 DIAGNOSIS — M6281 Muscle weakness (generalized): Secondary | ICD-10-CM

## 2020-06-17 DIAGNOSIS — M25612 Stiffness of left shoulder, not elsewhere classified: Secondary | ICD-10-CM | POA: Diagnosis not present

## 2020-06-17 DIAGNOSIS — M25512 Pain in left shoulder: Secondary | ICD-10-CM

## 2020-06-17 NOTE — Therapy (Signed)
[place], [place], [address] Phone: [phone]   Fax:  [phone]  Physical Therapy Treatment  Patient Details  Name: Andrew Payne MRN: [id number] Date of Birth: 12/02/1945 Referring Provider (PT): Joni Fears, MD   Encounter Date: 06/17/2020   PT End of Session - 06/17/20 1355    Visit Number 8    Number of Visits 20    Date for PT Re-Evaluation 07/29/20    PT Start Time 1300    PT Stop Time 2035    PT Time Calculation (min) 48 min    Activity Tolerance Patient tolerated treatment well;No increased pain    Behavior During Therapy WFL for tasks assessed/performed           Past Medical History:  Diagnosis Date  . Bradycardia    cardiology-- dr Curt Bears--  work-up done included event monitor and echo (all in epic)  . Carotid artery stenosis    per pt H&P dated 10-20-2018 --  <40%  . Coronary atherosclerosis   . Glaucoma, right eye   . History of nuclear stress test 04/15/2005   (in epic)  for near syncope--- Low risk normal no ischemia, ef 57%  . Hx of adenomatous colonic polyps 05/27/2016  . Internal hemorrhoids   . Morton neuroma, left   . OA (osteoarthritis)    wrist, elbow, thumb, knees  . PVC's (premature ventricular contractions)   . RBBB (right bundle branch block)    States heart rate has been as low as 28 and asyptomatic    Past Surgical History:  Procedure Laterality Date  . COLONOSCOPY  05/2016   TA  . ELBOW SURGERY Left 04/2017  . EXCISION MORTON'S NEUROMA Left 10/23/2018   Procedure: EXCISION MORTON'S NEUROMA LEFT FOOT;  Surgeon: Rosemary Holms, DPM;  Location: [place];  Service: Podiatry;  Laterality: Left;  . HEMORRHOID BANDING  2018  . INGUINAL HERNIA REPAIR Left 06/14/2017   Procedure: LEFT INGUINAL HERNIA REPAIR ERAS PATHWAY;  Surgeon: Erroll Luna, MD;  Location: [place];  Service: General;  Laterality: Left;  . INGUINAL HERNIA REPAIR Right  11-18-2009   dr Redmond Pulling  '@[facility]'   . INSERTION OF MESH Left 06/14/2017   Procedure: INSERTION OF MESH;  Surgeon: Erroll Luna, MD;  Location: [place];  Service: General;  Laterality: Left;  . KNEE ARTHROSCOPY W/ MENISCAL REPAIR Right 09/2015  . POLYPECTOMY    . TONSILLECTOMY AND ADENOIDECTOMY  child    There were no vitals filed for this visit.   Subjective Assessment - 06/17/20 1327    Subjective denies pain upon arrival however at worst his pain can still get up to 8 out of 10 with reaching behind back or sleeping on Lt side. 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, golfing    Pain Onset More than a month ago            [facility] Adult PT Treatment/Exercise - 06/17/20 0001      Shoulder Exercises: Standing   External Rotation Left    Theraband Level (Shoulder External Rotation) Level 2 (Red)    External Rotation Limitations 30, 15, 15 with BFR 70 mmHG    Internal Rotation Left    Theraband Level (Shoulder Internal Rotation) Level 3 (Green)    Internal Rotation Weight (lbs) 30,  15, 15 with BFR 70 mmHG    Flexion Both    Flexion Limitations 2# bar 30, 15, 15 with BFR 70 mmHG    Extension --    Extension Limitations --    Row Both    Theraband Level (Shoulder Row) Level 3 (Green)    Row Limitations 30, 15, 15 with BFR 70 MMHG    Other Standing Exercises bilat shoulder extensions and IR both with 2# bar 30 reps BFR 70 MMHG      Shoulder Exercises: Pulleys   Flexion 2 minutes    ABduction 2 minutes    Other Pulley Exercises IR with pulleys 2 min      Shoulder Exercises: ROM/Strengthening   UBE (Upper Arm Bike) 2.5 min fwd, 2.5 min retro L3    Ranger X 15 reps for flexion, abduction, cirlces in flexion CW,CCW all with BFR 70 mmHG    Other ROM/Strengthening Exercises wall ladder X 5 flexion and scaption       Shoulder Exercises: Stretch   Cross Chest Stretch 20 seconds;3 reps                    PT Short Term Goals - 06/13/20 1357      PT SHORT TERM GOAL #1   Title Patient will be I with HEP    Baseline met    Time 2    Period Weeks    Status Achieved    Target Date 06/03/20      PT SHORT TERM GOAL #2   Title Patient will improve PROM to at least 140 flexion, ER 80, 140 abduction for improved function    Baseline flex- 117, ER-60, 121-abd    Time 4    Period Weeks    Status New    Target Date 06/17/20             PT Long Term Goals - 05/20/20 1121      PT LONG TERM GOAL #1   Title Patient will demonstrate increased ROM to within functional use for returning to leisure activities    Baseline PAtient is unable to move actively due to precuations, PROM is lacking in all motions exept IR    Time 10    Period Weeks    Status New    Target Date 07/29/20      PT LONG TERM GOAL #2   Title Improve shoulder strength to offset atrophy from surgery to be back to 5/5 in all motions for increased ability to lift/carry/use tools    Baseline unable to assess    Time 10    Period Weeks    Status New    Target Date 07/29/20      PT LONG TERM GOAL #3   Title Patient will have increased ROM to be able to swing a golf club with proper body mechanics to play a round of golf    Baseline unable to do    Time 10    Period Weeks    Status New    Target Date 07/29/20      PT LONG TERM GOAL #4   Title Patient will be able to complete overhead activities without pain over 2/10 on NPS or limitation for ability to return to volunteer work    Baseline unable to do    Time 10    Period Weeks    Status New    Target Date 07/29/20  Plan - 06/17/20 1358    Clinical Impression Statement Began light strengthening today with BFR and he had good tolerance to this without complaints. He will need MD progress note next visit as he follows up with MD thursday.     Personal Factors and Comorbidities Age    Examination-Activity Limitations Reach Overhead;Lift;Carry    Examination-Participation Restrictions Cleaning;Community Activity;Volunteer    Stability/Clinical Decision Making Stable/Uncomplicated    Rehab Potential Excellent    PT Frequency 2x / week    PT Duration Other (comment)   10 weeks   PT Treatment/Interventions ADLs/Self Care Home Management;Functional mobility training;Therapeutic activities;Therapeutic exercise;Neuromuscular re-education;Manual techniques;Passive range of motion;Vasopneumatic Device;Cryotherapy;Electrical Stimulation;Iontophoresis 41m/ml Dexamethasone;Moist Heat;Ultrasound;Taping;Joint Manipulations;Dry needling    PT Next Visit Plan lAROM and may try light  resistance to week 6 (2/24).    PT Home Exercise Plan Access Code: [email or web address]   Consulted and Agree with Plan of Care Patient           Patient will benefit from skilled therapeutic intervention in order to improve the following deficits and impairments:  Decreased range of motion,Impaired flexibility,Decreased mobility,Decreased strength,Impaired UE functional use  Visit Diagnosis: Acute pain of left shoulder  Stiffness of left shoulder, not elsewhere classified  Muscle weakness (generalized)  Localized edema     Problem List Patient Active Problem List   Diagnosis Date Noted  . Nontraumatic incomplete tear of left rotator cuff 03/13/2020  . Impingement syndrome of left shoulder 08/23/2019  . Unilateral primary osteoarthritis, right knee 11/01/2017  . Chest pain 08/07/2017  . Bradycardia 08/07/2017  . History of right tennis elbow 05/09/2017  . Right tennis elbow 04/07/2017  . Prolapsed internal hemorrhoids, grade 3 06/03/2016  . Hx of adenomatous colonic polyps 05/27/2016    BDebbe Odea PT,DPT 06/17/2020, 1:59 PM  [facility] [address] [address]Phone: [phone]  Fax:   [phone] Name: Andrew FESTERMRN: [id number] of Birth: 7Mar 31, 1947

## 2020-06-19 ENCOUNTER — Ambulatory Visit: Payer: PPO | Admitting: Physical Therapy

## 2020-06-19 ENCOUNTER — Other Ambulatory Visit: Payer: Self-pay

## 2020-06-19 ENCOUNTER — Ambulatory Visit (INDEPENDENT_AMBULATORY_CARE_PROVIDER_SITE_OTHER): Payer: PPO | Admitting: Orthopaedic Surgery

## 2020-06-19 ENCOUNTER — Encounter: Payer: Self-pay | Admitting: Orthopaedic Surgery

## 2020-06-19 VITALS — Ht 74.0 in | Wt 181.0 lb

## 2020-06-19 DIAGNOSIS — R600 Localized edema: Secondary | ICD-10-CM | POA: Diagnosis not present

## 2020-06-19 DIAGNOSIS — M25612 Stiffness of left shoulder, not elsewhere classified: Secondary | ICD-10-CM | POA: Diagnosis not present

## 2020-06-19 DIAGNOSIS — M75112 Incomplete rotator cuff tear or rupture of left shoulder, not specified as traumatic: Secondary | ICD-10-CM

## 2020-06-19 DIAGNOSIS — M6281 Muscle weakness (generalized): Secondary | ICD-10-CM

## 2020-06-19 DIAGNOSIS — M25512 Pain in left shoulder: Secondary | ICD-10-CM

## 2020-06-19 NOTE — Therapy (Signed)
[facility] Physical Therapy [address] [place], [place], [address] Phone: [phone]   Fax:  [phone]  Physical Therapy Treatment/Progress note Progress Note reporting period 05/20/20 to 06/19/20  See below for objective and subjective measurements relating to patients progress with PT.   Patient Details  Name: Andrew Payne MRN: [id number] Date of Birth: 11-29-1945 Referring Provider (PT): Joni Fears, MD   Encounter Date: 06/19/2020   PT End of Session - 06/19/20 1053    Visit Number 9    Number of Visits 20    Date for PT Re-Evaluation 07/29/20    Progress Note Due on Visit 19    PT Start Time 1015    PT Stop Time 1056    PT Time Calculation (min) 41 min    Activity Tolerance Patient tolerated treatment well;No increased pain    Behavior During Therapy WFL for tasks assessed/performed           Past Medical History:  Diagnosis Date  . Bradycardia    cardiology-- dr Curt Bears--  work-up done included event monitor and echo (all in epic)  . Carotid artery stenosis    per pt H&P dated 10-20-2018 --  <40%  . Coronary atherosclerosis   . Glaucoma, right eye   . History of nuclear stress test 04/15/2005   (in epic)  for near syncope--- Low risk normal no ischemia, ef 57%  . Hx of adenomatous colonic polyps 05/27/2016  . Internal hemorrhoids   . Morton neuroma, left   . OA (osteoarthritis)    wrist, elbow, thumb, knees  . PVC's (premature ventricular contractions)   . RBBB (right bundle branch block)    States heart rate has been as low as 28 and asyptomatic    Past Surgical History:  Procedure Laterality Date  . COLONOSCOPY  05/2016   TA  . ELBOW SURGERY Left 04/2017  . EXCISION MORTON'S NEUROMA Left 10/23/2018   Procedure: EXCISION MORTON'S NEUROMA LEFT FOOT;  Surgeon: Rosemary Holms, DPM;  Location: [place];  Service: Podiatry;  Laterality: Left;  . HEMORRHOID BANDING  2018  . INGUINAL HERNIA REPAIR Left 06/14/2017    Procedure: LEFT INGUINAL HERNIA REPAIR ERAS PATHWAY;  Surgeon: Erroll Luna, MD;  Location: [place];  Service: General;  Laterality: Left;  . INGUINAL HERNIA REPAIR Right 11-18-2009   dr Redmond Pulling  '@[facility]'   . INSERTION OF MESH Left 06/14/2017   Procedure: INSERTION OF MESH;  Surgeon: Erroll Luna, MD;  Location: [place];  Service: General;  Laterality: Left;  . KNEE ARTHROSCOPY W/ MENISCAL REPAIR Right 09/2015  . POLYPECTOMY    . TONSILLECTOMY AND ADENOIDECTOMY  child    There were no vitals filed for this visit.   Subjective Assessment - 06/19/20 1017    Subjective He relays 3/10 soreness in his Lt shoulder today.    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, golfing    Pain Onset More than a month ago              [facility] PT Assessment - 06/19/20 0001      Assessment   Medical Diagnosis L shoulder RTC repair    Referring Provider (PT) Joni Fears, MD      AROM   Left Shoulder Flexion 160 Degrees    Left Shoulder ABduction 163 Degrees    Left Shoulder Internal Rotation --   T11 behind back,  T10 on other side   Left Shoulder External Rotation --   WNL     Strength   Strength Assessment Site Shoulder    Right/Left Shoulder Left    Left Shoulder Flexion 4/5    Left Shoulder ABduction 4/5    Left Shoulder Internal Rotation 5/5    Left Shoulder External Rotation 4/5                         [facility] Adult PT Treatment/Exercise - 06/19/20 0001      Shoulder Exercises: Standing   External Rotation Left    Theraband Level (Shoulder External Rotation) Level 2 (Red)    External Rotation Limitations 30, 15,  with BFR 70 mmHG    Internal Rotation Left    Theraband Level (Shoulder Internal Rotation) Level 3 (Green)    Internal Rotation Weight (lbs) 30, 15,  with BFR 70 mmHG    Extension Both    Theraband Level (Shoulder  Extension) Level 3 (Green)    Extension Limitations 30, 15, 15 with BFR 70 MMHG    Row Both    Theraband Level (Shoulder Row) Level 3 (Green)    Row Limitations 30, 15, 15 with BFR 70 MMHG    Other Standing Exercises wall push ups with BFR 70 mmHG 30, 15, 15    Other Standing Exercises OH ball stability rolls 2# ball with BFR 15 reps all planes      Shoulder Exercises: Pulleys   Flexion 2 minutes    ABduction 2 minutes    Other Pulley Exercises IR with pulleys 2 min      Shoulder Exercises: ROM/Strengthening   UBE (Upper Arm Bike) L3, 3 min fwd, 3 min retro    Ranger X 15 reps for flexion, abduction, cirlces in flexion CW,CCW all with BFR 70 mmHG      Shoulder Exercises: Stretch   Cross Chest Stretch 10 seconds;5 reps                    PT Short Term Goals - 06/19/20 1136      PT SHORT TERM GOAL #1   Title Patient will be I with HEP    Baseline met    Time 2    Period Weeks    Status Achieved    Target Date 06/03/20      PT SHORT TERM GOAL #2   Title Patient will improve PROM to at least 140 flexion, ER 80, 140 abduction for improved function    Time 4    Period Weeks    Status Achieved    Target Date 06/17/20             PT Long Term Goals - 06/19/20 1136      PT LONG TERM GOAL #1   Title Patient will demonstrate increased ROM to within functional use for returning to leisure activities    Baseline ROm now [facility] but still with mild defcits compared to opposite side    Time 10    Period Weeks    Status On-going      PT LONG TERM GOAL #2   Title Improve shoulder strength to offset atrophy from surgery to be back to 5/5 in all motions for increased ability to lift/carry/use tools    Baseline now 4-5    Time 10    Period Weeks    Status On-going      PT LONG TERM GOAL #3  Title Patient will have increased ROM to be able to swing a golf club with proper body mechanics to play a round of golf    Time 10    Period Weeks    Status New      PT LONG  TERM GOAL #4   Title Patient will be able to complete overhead activities without pain over 2/10 on NPS or 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    Status On-going                 Plan - 06/19/20 1054    Clinical Impression Statement MD progress note today shows he has made excellent early progress post op RTC repair. He has now began AROM and progressed to  light strength work and he is has had great tolerance to this without complaints. He will continue to benefit from PT to safely progress strength and allow him to return to his prior level of funciton and golf.    Personal Factors and Comorbidities Age    Examination-Activity Limitations Reach Overhead;Lift;Carry    Examination-Participation Restrictions Cleaning;Community Activity;Volunteer    Stability/Clinical Decision Making Stable/Uncomplicated    Rehab Potential Excellent    PT Frequency 2x / week    PT Duration Other (comment)   10 weeks   PT Treatment/Interventions ADLs/Self Care Home Management;Functional mobility training;Therapeutic activities;Therapeutic exercise;Neuromuscular re-education;Manual techniques;Passive range of motion;Vasopneumatic Device;Cryotherapy;Electrical Stimulation;Iontophoresis 81m/ml Dexamethasone;Moist Heat;Ultrasound;Taping;Joint Manipulations;Dry needling    PT Next Visit Plan progress as able, begin light golf simulation as able(no full swings yet)    PT Home Exercise Plan Access Code: [email or web address]   Consulted and Agree with Plan of Care Patient           Patient will benefit from skilled therapeutic intervention in order to improve the following deficits and impairments:  Decreased range of motion,Impaired flexibility,Decreased mobility,Decreased  strength,Impaired UE functional use  Visit Diagnosis: Acute pain of left shoulder  Stiffness of left shoulder, not elsewhere classified  Muscle weakness (generalized)  Localized edema     Problem List Patient Active Problem List   Diagnosis Date Noted  . Nontraumatic incomplete tear of left rotator cuff 03/13/2020  . Impingement syndrome of left shoulder 08/23/2019  . Unilateral primary osteoarthritis, right knee 11/01/2017  . Chest pain 08/07/2017  . Bradycardia 08/07/2017  . History of right tennis elbow 05/09/2017  . Right tennis elbow 04/07/2017  . Prolapsed internal hemorrhoids, grade 3 06/03/2016  . Hx of adenomatous colonic polyps 05/27/2016    BDebbe Odea PT,DPT 06/19/2020, 11:38 AM  [facility] [address] [address]Phone: [phone]  Fax:  [phone] Name: DLADARRIAN ASENCIOMRN: [id number] of Birth: [birth year]

## 2020-06-19 NOTE — Progress Notes (Signed)
Office Visit Note   Patient: Andrew Payne           Date of Birth: 1945/10/07           MRN: [id number] Visit Date: 06/19/2020              Requested by: Marton Redwood, MD [address] [place],  [place] [address] PCP: Marton Redwood, MD   Assessment & Plan: Visit Diagnoses:  1. Nontraumatic incomplete tear of left rotator cuff     Plan: 6 weeks status post rotator cuff tear repair of left shoulder.  There was an area of atrophy and probably a vascularity of the cough that I elliptically excised and then had good bleeding tissue side to side.  Also performed an SCD and DCR.  Doing well in physical therapy with almost full range of motion.  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  Still having some discomfort but nothing that I think is unusual.  We will plan to see him back in a month.  Discussed activity limitation  Follow-Up Instructions: Return in about 1 month (around 07/17/2020).   Orders:  No orders of the defined types were placed in this encounter.  No orders of the defined types were placed in this encounter.     Procedures: No procedures performed   Clinical Data: No additional findings.   Subjective: Chief Complaint  Patient presents with  . Left Shoulder - Follow-up    Left shoulder arthroscopy 05/08/2020  Patient presents today for left shoulder follow up. He had a left shoulder arthroscopy with rotator cuff tear repair, SCD, and DCR on 05/08/2020. Patient states that he is doing well. He has soreness today. He has been going to physical therapy twice weekly.   HPI  Review of Systems   Objective: Vital Signs: Ht 6\' 2"  (1.88 m)   Wt 181 lb (82.1 kg)   BMI 23.24 kg/m   Physical Exam  Ortho Exam has of impingement on the extreme of external rotation left shoulder but full abduction and almost fully overhead flexion that was quite quick.  Skin intact.  Incisions healing nicely.  Neurologically intact  Specialty Comments:  No specialty comments  available.  Imaging: No results found.   PMFS History: Patient Active Problem List   Diagnosis Date Noted  . Nontraumatic incomplete tear of left rotator cuff 03/13/2020  . Impingement syndrome of left shoulder 08/23/2019  . Unilateral primary osteoarthritis, right knee 11/01/2017  . Chest pain 08/07/2017  . Bradycardia 08/07/2017  . History of right tennis elbow 05/09/2017  . Right tennis elbow 04/07/2017  . Prolapsed internal hemorrhoids, grade 3 06/03/2016  . Hx of adenomatous colonic polyps 05/27/2016   Past Medical History:  Diagnosis Date  . Bradycardia    cardiology-- dr Curt Bears--  work-up done included event monitor and echo (all in epic)  . Carotid artery stenosis    per pt H&P dated 10-20-2018 --  <40%  . Coronary atherosclerosis   . Glaucoma, right eye   . History of nuclear stress test 04/15/2005   (in epic)  for near syncope--- Low risk normal no ischemia, ef 57%  . Hx of adenomatous colonic polyps 05/27/2016  . Internal hemorrhoids   . Morton neuroma, left   . OA (osteoarthritis)    wrist, elbow, thumb, knees  . PVC's (premature ventricular contractions)   . RBBB (right bundle branch block)    States heart rate has been as low as 28 and asyptomatic    Family History  Problem  Relation Age of Onset  . Heart disease Mother   . Alzheimer's disease Father   . Colon polyps Father   . Colon cancer Neg Hx   . Pancreatic cancer Neg Hx   . Prostate cancer Neg Hx   . Rectal cancer Neg Hx   . Esophageal cancer Neg Hx     Past Surgical History:  Procedure Laterality Date  . COLONOSCOPY  05/2016   TA  . ELBOW SURGERY Left 04/2017  . EXCISION MORTON'S NEUROMA Left 10/23/2018   Procedure: EXCISION MORTON'S NEUROMA LEFT FOOT;  Surgeon: Rosemary Holms, DPM;  Location: [place];  Service: Podiatry;  Laterality: Left;  . HEMORRHOID BANDING  2018  . INGUINAL HERNIA REPAIR Left 06/14/2017   Procedure: LEFT INGUINAL HERNIA REPAIR ERAS PATHWAY;  Surgeon:  Erroll Luna, MD;  Location: [place];  Service: General;  Laterality: Left;  . INGUINAL HERNIA REPAIR Right 11-18-2009   dr Redmond Pulling  @[facility]   . INSERTION OF MESH Left 06/14/2017   Procedure: INSERTION OF MESH;  Surgeon: Erroll Luna, MD;  Location: [place];  Service: General;  Laterality: Left;  . KNEE ARTHROSCOPY W/ MENISCAL REPAIR Right 09/2015  . POLYPECTOMY    . TONSILLECTOMY AND ADENOIDECTOMY  child   Social History   Occupational History  . Not on file  Tobacco Use  . Smoking status: Never Smoker  . Smokeless tobacco: Never Used  Vaping Use  . Vaping Use: Never used  Substance and Sexual Activity  . Alcohol use: Not Currently  . Drug use: No  . Sexual activity: Not on file

## 2020-06-24 ENCOUNTER — Other Ambulatory Visit: Payer: Self-pay

## 2020-06-24 ENCOUNTER — Ambulatory Visit: Payer: PPO | Admitting: Physical Therapy

## 2020-06-24 DIAGNOSIS — M25612 Stiffness of left shoulder, not elsewhere classified: Secondary | ICD-10-CM

## 2020-06-24 DIAGNOSIS — M6281 Muscle weakness (generalized): Secondary | ICD-10-CM | POA: Diagnosis not present

## 2020-06-24 DIAGNOSIS — R600 Localized edema: Secondary | ICD-10-CM

## 2020-06-24 DIAGNOSIS — M25512 Pain in left shoulder: Secondary | ICD-10-CM

## 2020-06-24 NOTE — Therapy (Signed)
[facility] [place], [place], [address] Phone: [phone]   Fax:  [phone]  Physical Therapy Treatment  Patient Details  Name: Andrew Payne MRN: [id number] Date of Birth: Oct 16, 1945 Referring Provider (PT): Joni Fears, MD   Encounter Date: 06/24/2020   PT End of Session - 06/24/20 1045    Visit Number 10    Number of Visits 20    Date for PT Re-Evaluation 07/29/20    Progress Note Due on Visit 19    PT Start Time 1002    PT Stop Time 1050    PT Time Calculation (min) 48 min    Activity Tolerance Patient tolerated treatment well;No increased pain    Behavior During Therapy WFL for tasks assessed/performed           Past Medical History:  Diagnosis Date  . Bradycardia    cardiology-- dr Curt Bears--  work-up done included event monitor and echo (all in epic)  . Carotid artery stenosis    per pt H&P dated 10-20-2018 --  <40%  . Coronary atherosclerosis   . Glaucoma, right eye   . History of nuclear stress test 04/15/2005   (in epic)  for near syncope--- Low risk normal no ischemia, ef 57%  . Hx of adenomatous colonic polyps 05/27/2016  . Internal hemorrhoids   . Morton neuroma, left   . OA (osteoarthritis)    wrist, elbow, thumb, knees  . PVC's (premature ventricular contractions)   . RBBB (right bundle branch block)    States heart rate has been as low as 28 and asyptomatic    Past Surgical History:  Procedure Laterality Date  . COLONOSCOPY  05/2016   TA  . ELBOW SURGERY Left 04/2017  . EXCISION MORTON'S NEUROMA Left 10/23/2018   Procedure: EXCISION MORTON'S NEUROMA LEFT FOOT;  Surgeon: Rosemary Holms, DPM;  Location: [place];  Service: Podiatry;  Laterality: Left;  . HEMORRHOID BANDING  2018  . INGUINAL HERNIA REPAIR Left 06/14/2017   Procedure: LEFT INGUINAL HERNIA REPAIR ERAS PATHWAY;  Surgeon: Erroll Luna, MD;  Location: [place];  Service: General;  Laterality:  Left;  . INGUINAL HERNIA REPAIR Right 11-18-2009   dr Redmond Pulling  '@[facility]'   . INSERTION OF MESH Left 06/14/2017   Procedure: INSERTION OF MESH;  Surgeon: Erroll Luna, MD;  Location: [place];  Service: General;  Laterality: Left;  . KNEE ARTHROSCOPY W/ MENISCAL REPAIR Right 09/2015  . POLYPECTOMY    . TONSILLECTOMY AND ADENOIDECTOMY  child    There were no vitals filed for this visit.   Subjective Assessment - 06/24/20 1017    Subjective He relays no sornenss at rest and about 4-5 soreness if he raises his arm into flexion or abd against resistance.    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, golfing    Pain Onset More than a month ago                             [facility] Adult PT Treatment/Exercise - 06/24/20 0001      Shoulder Exercises: Prone   Other Prone Exercises Prone Y,T,I, 2 sets of 10 with BFR 70 mmHG      Shoulder Exercises: Standing   External Rotation Left    Theraband Level (Shoulder External Rotation) Level 2 (Red)    External Rotation  Limitations 30, 15, 15  with BFR 70 mmHG    Other Standing Exercises wall push ups with BFR 70 mmHG 30, 15, 15      Shoulder Exercises: Pulleys   Flexion 2 minutes    ABduction 2 minutes      Shoulder Exercises: ROM/Strengthening   UBE (Upper Arm Bike) L5, 2.5 min fwd, 2.5 min retro    Ranger 2# X 15 reps for flexion, abduction, cirlces in flexion CW,CCW all with BFR 70 mmHG    Other ROM/Strengthening Exercises simulated golf downswing and backswing at cable machine 15 lbs 2X20, 25% golf chip shots with PW and foam balls X 10                    PT Short Term Goals - 06/19/20 1136      PT SHORT TERM GOAL #1   Title Patient will be I with HEP    Baseline met    Time 2    Period Weeks    Status Achieved    Target Date 06/03/20      PT SHORT TERM GOAL #2   Title Patient will  improve PROM to at least 140 flexion, ER 80, 140 abduction for improved function    Time 4    Period Weeks    Status Achieved    Target Date 06/17/20             PT Long Term Goals - 06/19/20 1136      PT LONG TERM GOAL #1   Title Patient will demonstrate increased ROM to within functional use for returning to leisure activities    Baseline ROm now [facility] but still with mild defcits compared to opposite side    Time 10    Period Weeks    Status On-going      PT LONG TERM GOAL #2   Title Improve shoulder strength to offset atrophy from surgery to be back to 5/5 in all motions for increased ability to lift/carry/use tools    Baseline now 4-5    Time 10    Period Weeks    Status On-going      PT LONG TERM GOAL #3   Title Patient will have increased ROM to be able to swing a golf club with proper body mechanics to play a round of golf    Time 10    Period Weeks    Status New      PT LONG TERM GOAL #4   Title Patient will be able to complete overhead activities without pain over 2/10 on NPS or 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    Status On-going                 Plan - 06/24/20 1046    Clinical Impression Statement His goal is to get back to playing golf so Began very light simulation today of 25% chip swings with pitching wedge and foam balls without any pain or difficulty. We will plan to try to progress him to full wedge shots over the next 2 weeks, then partial iron shots after that as long as he has  good overall tolerance to this. He continues to progress very well with his ROM and light strength program.    Personal Factors and Comorbidities Age    Examination-Activity Limitations Reach Overhead;Lift;Carry     Examination-Participation Restrictions Cleaning;Community Activity;Volunteer    Stability/Clinical Decision Making Stable/Uncomplicated    Rehab Potential Excellent    PT Frequency 2x / week    PT Duration Other (comment)   10 weeks   PT Treatment/Interventions ADLs/Self Care Home Management;Functional mobility training;Therapeutic activities;Therapeutic exercise;Neuromuscular re-education;Manual techniques;Passive range of motion;Vasopneumatic Device;Cryotherapy;Electrical Stimulation;Iontophoresis 34m/ml Dexamethasone;Moist Heat;Ultrasound;Taping;Joint Manipulations;Dry needling    PT Next Visit Plan progress as able, begin light golf simulation as able(no full swings yet)    PT Home Exercise Plan Access Code: [email or web address]   Consulted and Agree with Plan of Care Patient           Patient will benefit from skilled therapeutic intervention in order to improve the following deficits and impairments:  Decreased range of motion,Impaired flexibility,Decreased mobility,Decreased strength,Impaired UE functional use  Visit Diagnosis: Acute pain of left shoulder  Stiffness of left shoulder, not elsewhere classified  Muscle weakness (generalized)  Localized edema     Problem List Patient Active Problem List   Diagnosis Date Noted  . Nontraumatic incomplete tear of left rotator cuff 03/13/2020  . Impingement syndrome of left shoulder 08/23/2019  . Unilateral primary osteoarthritis, right knee 11/01/2017  . Chest pain 08/07/2017  . Bradycardia 08/07/2017  . History of right tennis elbow 05/09/2017  . Right tennis elbow 04/07/2017  . Prolapsed internal hemorrhoids, grade 3 06/03/2016  . Hx of adenomatous colonic polyps 05/27/2016    BDebbe Odea PT,DPT 06/24/2020, 10:53 AM  [facility] [address] [address]Phone: [phone]  Fax:  [phone] Name: Andrew MOUNTSMRN: [id number] of Birth: 707/07/1945

## 2020-06-26 ENCOUNTER — Ambulatory Visit: Payer: PPO | Admitting: Physical Therapy

## 2020-06-26 ENCOUNTER — Other Ambulatory Visit: Payer: Self-pay

## 2020-06-26 DIAGNOSIS — M25612 Stiffness of left shoulder, not elsewhere classified: Secondary | ICD-10-CM

## 2020-06-26 DIAGNOSIS — M6281 Muscle weakness (generalized): Secondary | ICD-10-CM | POA: Diagnosis not present

## 2020-06-26 DIAGNOSIS — R600 Localized edema: Secondary | ICD-10-CM | POA: Diagnosis not present

## 2020-06-26 DIAGNOSIS — M25512 Pain in left shoulder: Secondary | ICD-10-CM | POA: Diagnosis not present

## 2020-06-26 NOTE — Therapy (Signed)
[place], [place], [address] Phone: [phone]   Fax:  [phone]  Physical Therapy Treatment  Patient Details  Name: Andrew Payne MRN: [id number] Date of Birth: 1945-12-08 Referring Provider (PT): Joni Fears, MD   Encounter Date: 06/26/2020   PT End of Session - 06/26/20 1128    Visit Number 11    Number of Visits 20    Date for PT Re-Evaluation 07/29/20    Progress Note Due on Visit 19    PT Start Time 6387    PT Stop Time 1100    PT Time Calculation (min) 45 min    Activity Tolerance Patient tolerated treatment well;No increased pain    Behavior During Therapy WFL for tasks assessed/performed           Past Medical History:  Diagnosis Date  . Bradycardia    cardiology-- dr Curt Bears--  work-up done included event monitor and echo (all in epic)  . Carotid artery stenosis    per pt H&P dated 10-20-2018 --  <40%  . Coronary atherosclerosis   . Glaucoma, right eye   . History of nuclear stress test 04/15/2005   (in epic)  for near syncope--- Low risk normal no ischemia, ef 57%  . Hx of adenomatous colonic polyps 05/27/2016  . Internal hemorrhoids   . Morton neuroma, left   . OA (osteoarthritis)    wrist, elbow, thumb, knees  . PVC's (premature ventricular contractions)   . RBBB (right bundle branch block)    States heart rate has been as low as 28 and asyptomatic    Past Surgical History:  Procedure Laterality Date  . COLONOSCOPY  05/2016   TA  . ELBOW SURGERY Left 04/2017  . EXCISION MORTON'S NEUROMA Left 10/23/2018   Procedure: EXCISION MORTON'S NEUROMA LEFT FOOT;  Surgeon: Rosemary Holms, DPM;  Location: [place];  Service: Podiatry;  Laterality: Left;  . HEMORRHOID BANDING  2018  . INGUINAL HERNIA REPAIR Left 06/14/2017   Procedure: LEFT INGUINAL HERNIA REPAIR ERAS PATHWAY;  Surgeon: Erroll Luna, MD;  Location: [place];  Service: General;  Laterality:  Left;  . INGUINAL HERNIA REPAIR Right 11-18-2009   dr Redmond Pulling  '@[facility]'   . INSERTION OF MESH Left 06/14/2017   Procedure: INSERTION OF MESH;  Surgeon: Erroll Luna, MD;  Location: [place];  Service: General;  Laterality: Left;  . KNEE ARTHROSCOPY W/ MENISCAL REPAIR Right 09/2015  . POLYPECTOMY    . TONSILLECTOMY AND ADENOIDECTOMY  child    There were no vitals filed for this visit.   Subjective Assessment - 06/26/20 1026    Subjective He relays more overall sorness today in his shoulder, feels he slept wrong last night.    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, golfing    Pain Onset More than a month ago           [facility] Adult PT Treatment/Exercise - 06/26/20 0001      Shoulder Exercises: Prone   Other Prone Exercises      Shoulder Exercises: Standing   External Rotation Left    Theraband Level (Shoulder External Rotation) Level 3 (Green)    External Rotation Limitations 30, 15, 15  with BFR 70 mmHG    Other Standing Exercises --      Shoulder Exercises: Pulleys   Flexion 2 minutes    ABduction 2  minutes      Shoulder Exercises: ROM/Strengthening   UBE (Upper Arm Bike) L4, 3 min fwd, 3  min retro    Ranger 2# X 15 reps for flexion, abduction, cirlces in flexion CW,CCW all with BFR 70 mmHG    Lat Pull Limitations 25 lbs with BFR 70 mmHG 30, 15, 15    Cybex Press Limitations 25 lbs with BFR 70 mmHG 30, 15, 15    Cybex Row Limitations 25 lbs with BFR 70 mmHG 30, 15, 15    Other ROM/Strengthening Exercises simulated golf downswing and backswing at cable machine 20 lbs 2X20,                    PT Short Term Goals - 06/19/20 1136      PT SHORT TERM GOAL #1   Title Patient will be I with HEP    Baseline met    Time 2    Period Weeks    Status Achieved    Target Date 06/03/20      PT SHORT TERM GOAL #2   Title Patient will improve PROM to  at least 140 flexion, ER 80, 140 abduction for improved function    Time 4    Period Weeks    Status Achieved    Target Date 06/17/20             PT Long Term Goals - 06/19/20 1136      PT LONG TERM GOAL #1   Title Patient will demonstrate increased ROM to within functional use for returning to leisure activities    Baseline ROm now [facility] but still with mild defcits compared to opposite side    Time 10    Period Weeks    Status On-going      PT LONG TERM GOAL #2   Title Improve shoulder strength to offset atrophy from surgery to be back to 5/5 in all motions for increased ability to lift/carry/use tools    Baseline now 4-5    Time 10    Period Weeks    Status On-going      PT LONG TERM GOAL #3   Title Patient will have increased ROM to be able to swing a golf club with proper body mechanics to play a round of golf    Time 10    Period Weeks    Status New      PT LONG TERM GOAL #4   Title Patient will be able to complete overhead activities without pain over 2/10 on NPS or 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    Status On-going                 Plan - 06/26/20 1133    Clinical Impression Statement began light machine resistance training today and he shows good tolerance and understanding of using light weight with this. We will now allow him to begin this at the gym with light weight and 10-20 rep range. He continues to do exceptionally well with PT, continue POC.    Personal Factors and Comorbidities Age    Examination-Activity Limitations Reach Overhead;Lift;Carry    Examination-Participation  Restrictions Cleaning;Community Activity;Volunteer    Stability/Clinical Decision Making Stable/Uncomplicated    Rehab Potential  Excellent    PT Frequency 2x / week    PT Duration Other (comment)   10 weeks   PT Treatment/Interventions ADLs/Self Care Home Management;Functional mobility training;Therapeutic activities;Therapeutic exercise;Neuromuscular re-education;Manual techniques;Passive range of motion;Vasopneumatic Device;Cryotherapy;Electrical Stimulation;Iontophoresis 47m/ml Dexamethasone;Moist Heat;Ultrasound;Taping;Joint Manipulations;Dry needling    PT Next Visit Plan progress as able, begin light golf simulation as able(no full swings yet), work up to shoulder press next week    PT Home Exercise Plan Access Code: [email or web address]   Consulted and Agree with Plan of Care Patient           Patient will benefit from skilled therapeutic intervention in order to improve the following deficits and impairments:  Decreased range of motion,Impaired flexibility,Decreased mobility,Decreased strength,Impaired UE functional use  Visit Diagnosis: Acute pain of left shoulder  Stiffness of left shoulder, not elsewhere classified  Muscle weakness (generalized)  Localized edema     Problem List Patient Active Problem List   Diagnosis Date Noted  . Nontraumatic incomplete tear of left rotator cuff 03/13/2020  . Impingement syndrome of left shoulder 08/23/2019  . Unilateral primary osteoarthritis, right knee 11/01/2017  . Chest pain 08/07/2017  . Bradycardia 08/07/2017  . History of right tennis elbow 05/09/2017  . Right tennis elbow 04/07/2017  . Prolapsed internal hemorrhoids, grade 3 06/03/2016  . Hx of adenomatous colonic polyps 05/27/2016    BDebbe Odea PT,DPT 06/26/2020, 11:35 AM  [facility] [address] [address]Phone: [phone]  Fax:  [phone] Name: DMENASHE KAFERMRN: [id number] of Birth: 7June 19, 1947

## 2020-07-01 ENCOUNTER — Other Ambulatory Visit: Payer: Self-pay

## 2020-07-01 ENCOUNTER — Ambulatory Visit: Payer: PPO | Admitting: Physical Therapy

## 2020-07-01 DIAGNOSIS — M6281 Muscle weakness (generalized): Secondary | ICD-10-CM | POA: Diagnosis not present

## 2020-07-01 DIAGNOSIS — R600 Localized edema: Secondary | ICD-10-CM

## 2020-07-01 DIAGNOSIS — M25512 Pain in left shoulder: Secondary | ICD-10-CM | POA: Diagnosis not present

## 2020-07-01 DIAGNOSIS — M25612 Stiffness of left shoulder, not elsewhere classified: Secondary | ICD-10-CM

## 2020-07-01 NOTE — Therapy (Signed)
[place], [place], [address] Phone: [phone]   Fax:  [phone]  Physical Therapy Treatment  Patient Details  Name: Andrew Payne MRN: [id number] Date of Birth: 1945-11-19 Referring Provider (PT): Joni Fears, MD   Encounter Date: 07/01/2020   PT End of Session - 07/01/20 1055    Visit Number 12    Number of Visits 20    Date for PT Re-Evaluation 07/29/20    Progress Note Due on Visit 19    PT Start Time 1012    PT Stop Time 1050    PT Time Calculation (min) 38 min    Activity Tolerance Patient tolerated treatment well;No increased pain    Behavior During Therapy WFL for tasks assessed/performed           Past Medical History:  Diagnosis Date  . Bradycardia    cardiology-- dr Curt Bears--  work-up done included event monitor and echo (all in epic)  . Carotid artery stenosis    per pt H&P dated 10-20-2018 --  <40%  . Coronary atherosclerosis   . Glaucoma, right eye   . History of nuclear stress test 04/15/2005   (in epic)  for near syncope--- Low risk normal no ischemia, ef 57%  . Hx of adenomatous colonic polyps 05/27/2016  . Internal hemorrhoids   . Morton neuroma, left   . OA (osteoarthritis)    wrist, elbow, thumb, knees  . PVC's (premature ventricular contractions)   . RBBB (right bundle branch block)    States heart rate has been as low as 28 and asyptomatic    Past Surgical History:  Procedure Laterality Date  . COLONOSCOPY  05/2016   TA  . ELBOW SURGERY Left 04/2017  . EXCISION MORTON'S NEUROMA Left 10/23/2018   Procedure: EXCISION MORTON'S NEUROMA LEFT FOOT;  Surgeon: Rosemary Holms, DPM;  Location: [place];  Service: Podiatry;  Laterality: Left;  . HEMORRHOID BANDING  2018  . INGUINAL HERNIA REPAIR Left 06/14/2017   Procedure: LEFT INGUINAL HERNIA REPAIR ERAS PATHWAY;  Surgeon: Erroll Luna, MD;  Location: [place];  Service: General;  Laterality:  Left;  . INGUINAL HERNIA REPAIR Right 11-18-2009   dr Redmond Pulling  '@[facility]'   . INSERTION OF MESH Left 06/14/2017   Procedure: INSERTION OF MESH;  Surgeon: Erroll Luna, MD;  Location: [facility];  Service: General;  Laterality: Left;  . KNEE ARTHROSCOPY W/ MENISCAL REPAIR Right 09/2015  . POLYPECTOMY    . TONSILLECTOMY AND ADENOIDECTOMY  child    There were no vitals filed for this visit.   Subjective Assessment - 07/01/20 1031    Subjective He relays 5/10 overall soreness in his Lt shoulder today, did begin to chip golf balls and he relays this felt fine for him.    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, golfing    Pain Onset More than a month ago            [facility] Adult PT Treatment/Exercise - 07/01/20 0001      Shoulder Exercises: Prone   Other Prone Exercises Prone Y,T,I, 2 sets of 10 with BFR 70 mmHG    Other Prone Exercises quadriped alt arm raises  X15 H abd, X 15 flexion with BFR      Shoulder Exercises: Sidelying   External Rotation Strengthening;Left    External Rotation Weight (lbs) 2  External Rotation Limitations with BFR 70 mmHG 30, 15, 15      Shoulder Exercises: Standing   Flexion Left    Shoulder Flexion Weight (lbs) 2    Flexion Limitations 3#, 15, 15, 15 with BFR    ABduction Left    ABduction Limitations 3#, 15, 10, 10      Shoulder Exercises: ROM/Strengthening   UBE (Upper Arm Bike) L5, 3 min fwd, 3  min retro    Ranger 2# X 15 reps for flexion, abduction, cirlces in flexion CW,CCW all with BFR 70 mmHG                    PT Short Term Goals - 06/19/20 1136      PT SHORT TERM GOAL #1   Title Patient will be I with HEP    Baseline met    Time 2    Period Weeks    Status Achieved    Target Date 06/03/20      PT SHORT TERM GOAL #2   Title Patient will improve PROM to at least 140 flexion, ER 80, 140 abduction for  improved function    Time 4    Period Weeks    Status Achieved    Target Date 06/17/20             PT Long Term Goals - 06/19/20 1136      PT LONG TERM GOAL #1   Title Patient will demonstrate increased ROM to within functional use for returning to leisure activities    Baseline ROm now [facility] but still with mild defcits compared to opposite side    Time 10    Period Weeks    Status On-going      PT LONG TERM GOAL #2   Title Improve shoulder strength to offset atrophy from surgery to be back to 5/5 in all motions for increased ability to lift/carry/use tools    Baseline now 4-5    Time 10    Period Weeks    Status On-going      PT LONG TERM GOAL #3   Title Patient will have increased ROM to be able to swing a golf club with proper body mechanics to play a round of golf    Time 10    Period Weeks    Status New      PT LONG TERM GOAL #4   Title Patient will be able to complete overhead activities without pain over 2/10 on NPS or 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    Status On-going                 Plan - 07/01/20 1056    Clinical Impression Statement He continues to have soreness but does not feel we need to back off any strengthening. Continued with BFR training at his request. He showed good overall activity tolerance and is making great progress with strengtheing but does continue to have pain with ER activities at time.    Personal Factors and Comorbidities Age    Examination-Activity Limitations Reach Overhead;Lift;Carry    Examination-Participation Restrictions Cleaning;Community Activity;Volunteer    Stability/Clinical Decision  Making Stable/Uncomplicated    Rehab Potential Excellent    PT Frequency 2x / week     PT Duration Other (comment)   10 weeks   PT Treatment/Interventions ADLs/Self Care Home Management;Functional mobility training;Therapeutic activities;Therapeutic exercise;Neuromuscular re-education;Manual techniques;Passive range of motion;Vasopneumatic Device;Cryotherapy;Electrical Stimulation;Iontophoresis 28m/ml Dexamethasone;Moist Heat;Ultrasound;Taping;Joint Manipulations;Dry needling    PT Next Visit Plan progress as able, begin light golf simulation as able(no full swings yet), work up to shoulder press next week    PT Home Exercise Plan Access Code: [email or web address]   Consulted and Agree with Plan of Care Patient           Patient will benefit from skilled therapeutic intervention in order to improve the following deficits and impairments:  Decreased range of motion,Impaired flexibility,Decreased mobility,Decreased strength,Impaired UE functional use  Visit Diagnosis: Acute pain of left shoulder  Stiffness of left shoulder, not elsewhere classified  Muscle weakness (generalized)  Localized edema     Problem List Patient Active Problem List   Diagnosis Date Noted  . Nontraumatic incomplete tear of left rotator cuff 03/13/2020  . Impingement syndrome of left shoulder 08/23/2019  . Unilateral primary osteoarthritis, right knee 11/01/2017  . Chest pain 08/07/2017  . Bradycardia 08/07/2017  . History of right tennis elbow 05/09/2017  . Right tennis elbow 04/07/2017  . Prolapsed internal hemorrhoids, grade 3 06/03/2016  . Hx of adenomatous colonic polyps 05/27/2016    BDebbe Odea PT,DPT 07/01/2020, 10:57 AM  [facility] [address] [address]Phone: [phone]  Fax:  [phone] Name: DMAXIMILIAN TALLOMRN: [id number] of Birth: [birth year]

## 2020-07-03 ENCOUNTER — Other Ambulatory Visit: Payer: Self-pay

## 2020-07-03 ENCOUNTER — Ambulatory Visit: Payer: PPO | Admitting: Physical Therapy

## 2020-07-03 DIAGNOSIS — M25612 Stiffness of left shoulder, not elsewhere classified: Secondary | ICD-10-CM | POA: Diagnosis not present

## 2020-07-03 DIAGNOSIS — M25512 Pain in left shoulder: Secondary | ICD-10-CM | POA: Diagnosis not present

## 2020-07-03 DIAGNOSIS — M6281 Muscle weakness (generalized): Secondary | ICD-10-CM | POA: Diagnosis not present

## 2020-07-03 DIAGNOSIS — R600 Localized edema: Secondary | ICD-10-CM | POA: Diagnosis not present

## 2020-07-03 NOTE — Therapy (Signed)
[place], [place], [address] Phone: [phone]   Fax:  [phone]  Physical Therapy Treatment  Patient Details  Name: Andrew Payne MRN: [id number] Date of Birth: March 12, 1946 Referring Provider (PT): Joni Fears, MD   Encounter Date: 07/03/2020   PT End of Session - 07/03/20 1113    Visit Number 13    Number of Visits 20    Date for PT Re-Evaluation 07/29/20    Progress Note Due on Visit 19    PT Start Time 8325    PT Stop Time 1100    PT Time Calculation (min) 45 min    Activity Tolerance Patient tolerated treatment well;No increased pain    Behavior During Therapy WFL for tasks assessed/performed           Past Medical History:  Diagnosis Date  . Bradycardia    cardiology-- dr Curt Bears--  work-up done included event monitor and echo (all in epic)  . Carotid artery stenosis    per pt H&P dated 10-20-2018 --  <40%  . Coronary atherosclerosis   . Glaucoma, right eye   . History of nuclear stress test 04/15/2005   (in epic)  for near syncope--- Low risk normal no ischemia, ef 57%  . Hx of adenomatous colonic polyps 05/27/2016  . Internal hemorrhoids   . Morton neuroma, left   . OA (osteoarthritis)    wrist, elbow, thumb, knees  . PVC's (premature ventricular contractions)   . RBBB (right bundle branch block)    States heart rate has been as low as 28 and asyptomatic    Past Surgical History:  Procedure Laterality Date  . COLONOSCOPY  05/2016   TA  . ELBOW SURGERY Left 04/2017  . EXCISION MORTON'S NEUROMA Left 10/23/2018   Procedure: EXCISION MORTON'S NEUROMA LEFT FOOT;  Surgeon: Rosemary Holms, DPM;  Location: [facility];  Service: Podiatry;  Laterality: Left;  . HEMORRHOID BANDING  2018  . INGUINAL HERNIA REPAIR Left 06/14/2017   Procedure: LEFT INGUINAL HERNIA REPAIR ERAS PATHWAY;  Surgeon: Erroll Luna, MD;  Location: [employer];  Service: General;  Laterality:  Left;  . INGUINAL HERNIA REPAIR Right 11-18-2009   dr Redmond Pulling  '@[facility]'   . INSERTION OF MESH Left 06/14/2017   Procedure: INSERTION OF MESH;  Surgeon: Erroll Luna, MD;  Location: [facility];  Service: General;  Laterality: Left;  . KNEE ARTHROSCOPY W/ MENISCAL REPAIR Right 09/2015  . POLYPECTOMY    . TONSILLECTOMY AND ADENOIDECTOMY  child    There were no vitals filed for this visit.   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, golfing    Pain Onset More than a month ago              [facility] PT Assessment - 07/03/20 0001      Assessment   Medical Diagnosis L shoulder RTC repair    Referring Provider (PT) Joni Fears, MD    Onset Date/Surgical Date 05/08/20      AROM   Left Shoulder Flexion 165 Degrees    Left Shoulder ABduction --   WNL   Left Shoulder Internal Rotation --   T11 behind back, T10 on other side   Left Shoulder External Rotation --  WNL     Strength   Left Shoulder Flexion 4+/5    Left Shoulder ABduction 4+/5    Left Shoulder Internal Rotation 5/5    Left Shoulder External Rotation 4+/5                         [facility] Adult PT Treatment/Exercise - 07/03/20 0001      Shoulder Exercises: Sidelying   External Rotation Strengthening;Left    External Rotation Weight (lbs) 3    External Rotation Limitations with BFR 70 mmHG 20, 15, 15      Shoulder Exercises: Standing   External Rotation Left    Theraband Level (Shoulder External Rotation) Level 2 (Red)    External Rotation Limitations 2X20 at 90/90 deg with bfr    Internal Rotation Left    Theraband Level (Shoulder Internal Rotation) Level 4 (Blue)    Internal Rotation Limitations 2X20 at 90/90 deg with bfr    Other Standing  Exercises OH shoulder press 5 lbs 3X15 bilat with BFR, BAPS screw on/81f L1-4 for shoulder endurance    Other Standing Exercises shld flexion to abd and down then reveres 3X10 bilat 2# with BFR      Shoulder Exercises: Pulleys   Flexion 2 minutes    ABduction 2 minutes      Shoulder Exercises: ROM/Strengthening   UBE (Upper Arm Bike) L5, 3 min fwd, 3  min retro          Prone Y,T,I with BFR 2 sets of 15 ea on Lt          PT Short Term Goals - 06/19/20 1136      PT SHORT TERM GOAL #1   Title Patient will be I with HEP    Baseline met    Time 2    Period Weeks    Status Achieved    Target Date 06/03/20      PT SHORT TERM GOAL #2   Title Patient will improve PROM to at least 140 flexion, ER 80, 140 abduction for improved function    Time 4    Period Weeks    Status Achieved    Target Date 06/17/20             PT Long Term Goals - 06/19/20 1136      PT LONG TERM GOAL #1   Title Patient will demonstrate increased ROM to within functional use for returning to leisure activities    [facility] now [facility] still with mild defcits compared to opposite side    Time 10    Period Weeks    Status On-going      PT LONG TERM GOAL #2   Title Improve shoulder strength to offset atrophy from surgery to be back to 5/5 in all motions for increased ability to lift/carry/use tools    Baseline now 4-5    Time 10    Period Weeks    Status On-going      PT LONG TERM GOAL #3   Title Patient will have increased ROM to be able to swing a golf club with proper body mechanics to play a round of golf    Time 10    Period Weeks    Status New      PT LONG TERM GOAL #4   Title Patient will be able to complete overhead activities without pain over 2/10 on NPS or limitation for ability to return to volunteer work  Baseline just now beginning light work with this but lacks strength and endurance with this    Time 10    Period Weeks    Status On-going      PT LONG TERM GOAL #5    Title [name] will be independent with his long-term HEP at [place].    Status On-going                 Plan - 07/03/20 1114    Clinical Impression Statement Overall less pain and soreness this week in his Lt shoulder so progressed his strengthening program with great tolerance to this. Increased his strengthening program at home as well. His ROM is doing well. Continue POC    Personal Factors and Comorbidities Age    Examination-Activity Limitations Reach Overhead;Lift;Carry    Examination-Participation Restrictions Cleaning;Community Activity;Volunteer    Stability/Clinical Decision Making Stable/Uncomplicated    Rehab Potential Excellent    PT Frequency 2x / week    PT Duration Other (comment)   10 weeks   PT Treatment/Interventions ADLs/Self Care Home Management;Functional mobility training;Therapeutic activities;Therapeutic exercise;Neuromuscular re-education;Manual techniques;Passive range of motion;Vasopneumatic Device;Cryotherapy;Electrical Stimulation;Iontophoresis 76m/ml Dexamethasone;Moist Heat;Ultrasound;Taping;Joint Manipulations;Dry needling    PT Next Visit Plan progress as able, begin light golf simulation as able(no full swings yet), work up to shoulder press next week    PT Home Exercise Plan Access Code: [email or web address]   Consulted and Agree with Plan of Care Patient           Patient will benefit from skilled therapeutic intervention in order to improve the following deficits and impairments:  Decreased range of motion,Impaired flexibility,Decreased mobility,Decreased strength,Impaired UE functional use  Visit Diagnosis: Acute pain of left shoulder  Stiffness of left shoulder, not elsewhere classified  Muscle weakness (generalized)  Localized edema     Problem List Patient Active Problem List   Diagnosis Date Noted  . Nontraumatic incomplete tear of left rotator cuff 03/13/2020  . Impingement syndrome of left shoulder 08/23/2019  . Unilateral primary  osteoarthritis, right knee 11/01/2017  . Chest pain 08/07/2017  . Bradycardia 08/07/2017  . History of right tennis elbow 05/09/2017  . Right tennis elbow 04/07/2017  . Prolapsed internal hemorrhoids, grade 3 06/03/2016  . Hx of adenomatous colonic polyps 05/27/2016    BDebbe Odea PT,DPT 07/03/2020, 11:16 AM  [facility] [address] [address]Phone: [phone]  Fax:  [phone] Name: Andrew HEMMERMRN: [id number] of Birth: 705-03-1946

## 2020-07-07 DIAGNOSIS — H5212 Myopia, left eye: Secondary | ICD-10-CM | POA: Diagnosis not present

## 2020-07-07 DIAGNOSIS — H40021 Open angle with borderline findings, high risk, right eye: Secondary | ICD-10-CM | POA: Diagnosis not present

## 2020-07-07 DIAGNOSIS — H52221 Regular astigmatism, right eye: Secondary | ICD-10-CM | POA: Diagnosis not present

## 2020-07-07 DIAGNOSIS — H401111 Primary open-angle glaucoma, right eye, mild stage: Secondary | ICD-10-CM | POA: Diagnosis not present

## 2020-07-07 DIAGNOSIS — H401121 Primary open-angle glaucoma, left eye, mild stage: Secondary | ICD-10-CM | POA: Diagnosis not present

## 2020-07-07 DIAGNOSIS — H524 Presbyopia: Secondary | ICD-10-CM | POA: Diagnosis not present

## 2020-07-08 ENCOUNTER — Other Ambulatory Visit: Payer: Self-pay

## 2020-07-08 ENCOUNTER — Ambulatory Visit: Payer: PPO | Admitting: Physical Therapy

## 2020-07-08 ENCOUNTER — Encounter: Payer: Self-pay | Admitting: Physical Therapy

## 2020-07-08 DIAGNOSIS — M25512 Pain in left shoulder: Secondary | ICD-10-CM

## 2020-07-08 DIAGNOSIS — M25612 Stiffness of left shoulder, not elsewhere classified: Secondary | ICD-10-CM

## 2020-07-08 DIAGNOSIS — R600 Localized edema: Secondary | ICD-10-CM | POA: Diagnosis not present

## 2020-07-08 DIAGNOSIS — M6281 Muscle weakness (generalized): Secondary | ICD-10-CM

## 2020-07-08 NOTE — Therapy (Signed)
[place], [place], [address] Phone: [phone]   Fax:  [phone]  Physical Therapy Treatment  Patient Details  Name: Andrew Payne MRN: [id number] Date of Birth: 12/07/1945 Referring Provider (PT): Joni Fears, MD   Encounter Date: 07/08/2020   PT End of Session - 07/08/20 1111    Visit Number 14    Number of Visits 20    Date for PT Re-Evaluation 07/29/20    Progress Note Due on Visit 19    PT Start Time 6256    PT Stop Time 1103    PT Time Calculation (min) 48 min    Activity Tolerance Patient tolerated treatment well;No increased pain    Behavior During Therapy WFL for tasks assessed/performed           Past Medical History:  Diagnosis Date  . Bradycardia    cardiology-- dr Curt Bears--  work-up done included event monitor and echo (all in epic)  . Carotid artery stenosis    per pt H&P dated 10-20-2018 --  <40%  . Coronary atherosclerosis   . Glaucoma, right eye   . History of nuclear stress test 04/15/2005   (in epic)  for near syncope--- Low risk normal no ischemia, ef 57%  . Hx of adenomatous colonic polyps 05/27/2016  . Internal hemorrhoids   . Morton neuroma, left   . OA (osteoarthritis)    wrist, elbow, thumb, knees  . PVC's (premature ventricular contractions)   . RBBB (right bundle branch block)    States heart rate has been as low as 28 and asyptomatic    Past Surgical History:  Procedure Laterality Date  . COLONOSCOPY  05/2016   TA  . ELBOW SURGERY Left 04/2017  . EXCISION MORTON'S NEUROMA Left 10/23/2018   Procedure: EXCISION MORTON'S NEUROMA LEFT FOOT;  Surgeon: Rosemary Holms, DPM;  Location: [place] [place];  Service: Podiatry;  Laterality: Left;  . HEMORRHOID BANDING  2018  . INGUINAL HERNIA REPAIR Left 06/14/2017   Procedure: LEFT INGUINAL HERNIA REPAIR ERAS PATHWAY;  Surgeon: Erroll Luna, MD;  Location: [facility];  Service: General;  Laterality:  Left;  . INGUINAL HERNIA REPAIR Right 11-18-2009   dr Redmond Pulling  '@[facility]'   . INSERTION OF MESH Left 06/14/2017   Procedure: INSERTION OF MESH;  Surgeon: Erroll Luna, MD;  Location: [facility];  Service: General;  Laterality: Left;  . KNEE ARTHROSCOPY W/ MENISCAL REPAIR Right 09/2015  . POLYPECTOMY    . TONSILLECTOMY AND ADENOIDECTOMY  child    There were no vitals filed for this visit.   Subjective Assessment - 07/08/20 1110    Subjective He relays overall his shoulder continues to slowly improve with pain and sorness and he has been compliant with HEP and has been working out at [employer] as well.    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, golfing    Pain Onset More than a month ago            [facility] Adult PT Treatment/Exercise - 07/08/20 0001      Shoulder Exercises: Prone   Other Prone Exercises Prone Y,T,I, 2 sets of 15 with BFR 70 mmHG      Shoulder Exercises: Sidelying   External Rotation Strengthening;Left    External Rotation Weight (lbs) 4    External Rotation Limitations with BFR 70 mmHG 30, 15, 15  Shoulder Exercises: Standing   External Rotation Left    Theraband Level (Shoulder External Rotation) Level 3 (Green)    External Rotation Limitations reps of 30, 15, 15 at 90/90 deg with bfr    Internal Rotation Left    Theraband Level (Shoulder Internal Rotation) Level 4 (Blue)    Internal Rotation Limitations reps of 30, 15, 15 at 90/90 deg with bfr    Other Standing Exercises OH shoulder press 6  lbs reps of 20, 15, 15  bilat with BFR,  wall slides into Y with red band 2X10    Other Standing Exercises counter top push ups 30, 15, 15 with BFR, body blade 20 sec flexion and scaption  X2 ea with BFR.      Shoulder Exercises: Pulleys   Flexion 2 minutes    ABduction 2 minutes      Shoulder Exercises: ROM/Strengthening   UBE (Upper Arm Bike) L5, 3  min fwd, 3  min retro                    PT Short Term Goals - 06/19/20 1136      PT SHORT TERM GOAL #1   Title Patient will be I with HEP    Baseline met    Time 2    Period Weeks    Status Achieved    Target Date 06/03/20      PT SHORT TERM GOAL #2   Title Patient will improve PROM to at least 140 flexion, ER 80, 140 abduction for improved function    Time 4    Period Weeks    Status Achieved    Target Date 06/17/20             PT Long Term Goals - 06/19/20 1136      PT LONG TERM GOAL #1   Title Patient will demonstrate increased ROM to within functional use for returning to leisure activities    Baseline ROm now [facility] but still with mild defcits compared to opposite side    Time 10    Period Weeks    Status On-going      PT LONG TERM GOAL #2   Title Improve shoulder strength to offset atrophy from surgery to be back to 5/5 in all motions for increased ability to lift/carry/use tools    Baseline now 4-5    Time 10    Period Weeks    Status On-going      PT LONG TERM GOAL #3   Title Patient will have increased ROM to be able to swing a golf club with proper body mechanics to play a round of golf    Time 10    Period Weeks    Status New      PT LONG TERM GOAL #4   Title Patient will be able to complete overhead activities without pain over 2/10 on NPS or 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    Status On-going                 Plan - 07/08/20 1113    Clinical Impression Statement Continued with strength progression and  he has good tolerance to this. Overall he continues to progress very well with PT and pain is overall steadily improving. Continue POC.    Personal Factors and Comorbidities Age     Examination-Activity Limitations Reach Overhead;Lift;Carry    Examination-Participation Restrictions Cleaning;Community Activity;Volunteer    Stability/Clinical Decision Making Stable/Uncomplicated    Rehab Potential Excellent    PT Frequency 2x / week    PT Duration Other (comment)   10 weeks   PT Treatment/Interventions ADLs/Self Care Home Management;Functional mobility training;Therapeutic activities;Therapeutic exercise;Neuromuscular re-education;Manual techniques;Passive range of motion;Vasopneumatic Device;Cryotherapy;Electrical Stimulation;Iontophoresis 26m/ml Dexamethasone;Moist Heat;Ultrasound;Taping;Joint Manipulations;Dry needling    PT Next Visit Plan progress as able, 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 Nontraumatic incomplete tear of left rotator cuff 03/13/2020  . Impingement syndrome of left shoulder 08/23/2019  . Unilateral primary osteoarthritis, right knee 11/01/2017  . Chest pain 08/07/2017  . Bradycardia 08/07/2017  . History of right tennis elbow 05/09/2017  . Right tennis elbow 04/07/2017  . Prolapsed internal hemorrhoids, grade 3 06/03/2016  . Hx of adenomatous colonic polyps 05/27/2016    BDebbe Odea, PT,DPT 07/08/2020, 11:16 AM  [facility] [address] [address]Phone: [phone]  Fax:  [phone] Name: DHARUN BRUMLEYMRN: [id number] of Birth: 706-Feb-1947

## 2020-07-10 ENCOUNTER — Other Ambulatory Visit: Payer: Self-pay

## 2020-07-10 ENCOUNTER — Ambulatory Visit: Payer: PPO | Admitting: Physical Therapy

## 2020-07-10 ENCOUNTER — Encounter: Payer: Self-pay | Admitting: Physical Therapy

## 2020-07-10 DIAGNOSIS — M25512 Pain in left shoulder: Secondary | ICD-10-CM

## 2020-07-10 DIAGNOSIS — R600 Localized edema: Secondary | ICD-10-CM

## 2020-07-10 DIAGNOSIS — M6281 Muscle weakness (generalized): Secondary | ICD-10-CM | POA: Diagnosis not present

## 2020-07-10 DIAGNOSIS — M25612 Stiffness of left shoulder, not elsewhere classified: Secondary | ICD-10-CM | POA: Diagnosis not present

## 2020-07-10 NOTE — Therapy (Signed)
[place], [place], [address] Phone: [phone]   Fax:  [phone]  Physical Therapy Treatment  Patient Details  Name: Andrew Payne MRN: [id number] Date of Birth: [birth year] Referring Provider (PT): Joni Fears, MD   Encounter Date: 07/10/2020   PT End of Session - 07/10/20 1136    Visit Number 15    Number of Visits 20    Date for PT Re-Evaluation 07/29/20    Progress Note Due on Visit 19    PT Start Time 6568    PT Stop Time 1100    PT Time Calculation (min) 45 min    Activity Tolerance Patient tolerated treatment well;No increased pain    Behavior During Therapy WFL for tasks assessed/performed           Past Medical History:  Diagnosis Date  . Bradycardia    cardiology-- dr Curt Bears--  work-up done included event monitor and echo (all in epic)  . Carotid artery stenosis    per pt H&P dated 10-20-2018 --  <40%  . Coronary atherosclerosis   . Glaucoma, right eye   . History of nuclear stress test 04/15/2005   (in epic)  for near syncope--- Low risk normal no ischemia, ef 57%  . Hx of adenomatous colonic polyps 05/27/2016  . Internal hemorrhoids   . Morton neuroma, left   . OA (osteoarthritis)    wrist, elbow, thumb, knees  . PVC's (premature ventricular contractions)   . RBBB (right bundle branch block)    States heart rate has been as low as 28 and asyptomatic    Past Surgical History:  Procedure Laterality Date  . COLONOSCOPY  05/2016   TA  . ELBOW SURGERY Left 04/2017  . EXCISION MORTON'S NEUROMA Left 10/23/2018   Procedure: EXCISION MORTON'S NEUROMA LEFT FOOT;  Surgeon: Rosemary Holms, DPM;  Location: [facility];  Service: Podiatry;  Laterality: Left;  . HEMORRHOID BANDING  2018  . INGUINAL HERNIA REPAIR Left 06/14/2017   Procedure: LEFT INGUINAL HERNIA REPAIR ERAS PATHWAY;  Surgeon: Erroll Luna, MD;  Location: [facility];  Service: General;  Laterality:  Left;  . INGUINAL HERNIA REPAIR Right 11-18-2009   dr Redmond Pulling  _0   . INSERTION OF MESH Left 06/14/2017   Procedure: INSERTION OF MESH;  Surgeon: Erroll Luna, MD;  Location: [facility];  Service: General;  Laterality: Left;  . KNEE ARTHROSCOPY W/ MENISCAL REPAIR Right 09/2015  . POLYPECTOMY    . TONSILLECTOMY AND ADENOIDECTOMY  child    There were no vitals filed for this visit.   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, golfing    Pain Onset More than a month ago             [facility] Adult PT Treatment/Exercise - 07/10/20 0001      Shoulder Exercises: Prone   Other Prone Exercises Prone Y,T,I, with 1#  2 sets of 15 with BFR 70 mmHG      Shoulder Exercises: Sidelying   External Rotation Strengthening;Left    External Rotation Weight (lbs) 5    External Rotation Limitations with BFR 70 mmHG 15, 15, 15      Shoulder Exercises: Standing   Horizontal ABduction Both    Theraband  Level (Shoulder Horizontal ABduction) Level 3 (Green)    Horizontal ABduction Limitations with BFR 89mHG  3X20    External Rotation Left    Theraband Level (Shoulder External Rotation) Level 4 (Blue)    External Rotation Limitations reps of 30, 15, 15 at 90/90 deg with bfr    Internal Rotation Left    Theraband Level (Shoulder Internal Rotation) Level 4 (Blue)    Internal Rotation Limitations reps of 30, 15, 15 at 90/90 deg with bfr    Other Standing Exercises cable machine curl into press on Lt with 5# 30, 15, 15 with BFR      Shoulder Exercises: Pulleys   Flexion 2 minutes    ABduction 2 minutes    Other Pulley Exercises IR 1 min      Shoulder Exercises: ROM/Strengthening   [place] 3# X 15 reps for flexion, abduction, cirlces in flexion CW,CCW all with  BFR 70 mmHG             PT Short Term Goals - 06/19/20 1136      PT SHORT TERM GOAL #1   Title Patient will be I with HEP    Baseline met    Time 2    Period Weeks    Status Achieved    Target Date 06/03/20      PT SHORT TERM GOAL #2   Title Patient will improve PROM to at least 140 flexion, ER 80, 140 abduction for improved function    Time 4    Period Weeks    Status Achieved    Target Date 06/17/20             PT Long Term Goals - 06/19/20 1136      PT LONG TERM GOAL #1   Title Patient will demonstrate increased ROM to within functional use for returning to leisure activities    Baseline ROm now [facility] still with mild defcits compared to opposite side    Time 10    Period Weeks    Status On-going      PT LONG TERM GOAL #2   Title Improve shoulder strength to offset atrophy from surgery to be back to 5/5 in all motions for increased ability to lift/carry/use tools    Baseline now 4-5    Time 10    Period Weeks    Status On-going      PT LONG TERM GOAL #3   Title Patient will have increased ROM to be able to swing a golf club with proper body mechanics to play a round of golf    Time 10    Period Weeks    Status New      PT LONG TERM GOAL #4   Title Patient will be able to complete overhead activities without pain over 2/10 on NPS or 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    Status On-going                 Plan - 07/10/20 1137    Clinical Impression Statement He is 9 weeks post op RTC repair to left shoulder and overall continues to do very well with  only minimal complaints of pain/soreness. We will continue to progress strenght and overhead activity to his tolerance.    Personal Factors and Comorbidities  Age    Examination-Activity Limitations Reach Overhead;Lift;Carry    Examination-Participation Restrictions Cleaning;Community Activity;Volunteer    Stability/Clinical Decision Making Stable/Uncomplicated    Rehab Potential Excellent    PT Frequency 2x / week    PT Duration Other (comment)   10 weeks   PT Treatment/Interventions ADLs/Self Care Home Management;Functional mobility training;Therapeutic activities;Therapeutic exercise;Neuromuscular re-education;Manual techniques;Passive range of motion;Vasopneumatic Device;Cryotherapy;Electrical Stimulation;Iontophoresis 38m/ml Dexamethasone;Moist Heat;Ultrasound;Taping;Joint Manipulations;Dry needling    PT Next Visit Plan progress as able, 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 Nontraumatic incomplete tear of left rotator cuff 03/13/2020  . Impingement syndrome of left shoulder 08/23/2019  . Unilateral primary osteoarthritis, right knee 11/01/2017  . Chest pain 08/07/2017  . Bradycardia 08/07/2017  . History of right tennis elbow 05/09/2017  . Right tennis elbow 04/07/2017  . Prolapsed internal hemorrhoids, grade 3 06/03/2016  . Hx of adenomatous colonic polyps 05/27/2016    BDebbe Odea PT,DPT 07/10/2020, 11:38 AM  [facility] [address] [address]Phone: [phone]  Fax:   [phone] Name: Andrew KOLBEMRN: [id number] of Birth: 7March 28, 1947

## 2020-07-16 ENCOUNTER — Other Ambulatory Visit: Payer: Self-pay

## 2020-07-16 ENCOUNTER — Encounter: Payer: Self-pay | Admitting: Rehabilitative and Restorative Service Providers"

## 2020-07-16 ENCOUNTER — Ambulatory Visit: Payer: PPO | Admitting: Rehabilitative and Restorative Service Providers"

## 2020-07-16 DIAGNOSIS — M25512 Pain in left shoulder: Secondary | ICD-10-CM

## 2020-07-16 DIAGNOSIS — M25612 Stiffness of left shoulder, not elsewhere classified: Secondary | ICD-10-CM | POA: Diagnosis not present

## 2020-07-16 DIAGNOSIS — M6281 Muscle weakness (generalized): Secondary | ICD-10-CM

## 2020-07-16 DIAGNOSIS — G8929 Other chronic pain: Secondary | ICD-10-CM

## 2020-07-16 DIAGNOSIS — R600 Localized edema: Secondary | ICD-10-CM

## 2020-07-16 NOTE — Therapy (Signed)
[facility] [place], [place], [address] Phone: [phone]   Fax:  [phone]  Physical Therapy Treatment  Patient Details  Name: Andrew Payne MRN: [id number] Date of Birth: 1946/04/17 Referring Provider (PT): Joni Fears, MD   Encounter Date: 07/16/2020   PT End of Session - 07/16/20 1745    Visit Number 16    Number of Visits 20    Date for PT Re-Evaluation 08/13/20    Progress Note Due on Visit 20    PT Start Time 5110    PT Stop Time 1345    PT Time Calculation (min) 47 min    Activity Tolerance Patient tolerated treatment well;No increased pain    Behavior During Therapy WFL for tasks assessed/performed           Past Medical History:  Diagnosis Date  . Bradycardia    cardiology-- dr Curt Bears--  work-up done included event monitor and echo (all in epic)  . Carotid artery stenosis    per pt H&P dated 10-20-2018 --  <40%  . Coronary atherosclerosis   . Glaucoma, right eye   . History of nuclear stress test 04/15/2005   (in epic)  for near syncope--- Low risk normal no ischemia, ef 57%  . Hx of adenomatous colonic polyps 05/27/2016  . Internal hemorrhoids   . Morton neuroma, left   . OA (osteoarthritis)    wrist, elbow, thumb, knees  . PVC's (premature ventricular contractions)   . RBBB (right bundle branch block)    States heart rate has been as low as 28 and asyptomatic    Past Surgical History:  Procedure Laterality Date  . COLONOSCOPY  05/2016   TA  . ELBOW SURGERY Left 04/2017  . EXCISION MORTON'S NEUROMA Left 10/23/2018   Procedure: EXCISION MORTON'S NEUROMA LEFT FOOT;  Surgeon: Rosemary Holms, DPM;  Location: [facility];  Service: Podiatry;  Laterality: Left;  . HEMORRHOID BANDING  2018  . INGUINAL HERNIA REPAIR Left 06/14/2017   Procedure: LEFT INGUINAL HERNIA REPAIR ERAS PATHWAY;  Surgeon: Erroll Luna, MD;  Location: [facility];  Service: General;  Laterality:  Left;  . INGUINAL HERNIA REPAIR Right 11-18-2009   dr Redmond Pulling  '@[facility]'   . INSERTION OF MESH Left 06/14/2017   Procedure: INSERTION OF MESH;  Surgeon: Erroll Luna, MD;  Location: [facility];  Service: General;  Laterality: Left;  . KNEE ARTHROSCOPY W/ MENISCAL REPAIR Right 09/2015  . POLYPECTOMY    . TONSILLECTOMY AND ADENOIDECTOMY  child    There were no vitals filed for this visit.   Subjective Assessment - 07/16/20 1300    Subjective Andrew Payne is very happy with his progress post-surgery.  He was encouraged to discuss return to golf timelines with Dr. Durward Fortes.    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 No/denies    Pain Onset More than a month ago              [facility] PT Assessment - 07/16/20 0001      ROM / Strength   AROM / PROM / Strength AROM;Strength      AROM   Overall AROM  Deficits    AROM Assessment Site Shoulder    Right/Left Shoulder Left    Left Shoulder Flexion 150 Degrees    Left Shoulder Internal Rotation 35 Degrees  Left Shoulder External Rotation 80 Degrees    Left Shoulder Horizontal ADduction 35 Degrees      Strength   Overall Strength Deficits    Strength Assessment Site Shoulder    Right/Left Shoulder Left;Right    Right Shoulder Internal Rotation --   38 pounds   Right Shoulder External Rotation --   26 pounds   Left Shoulder Internal Rotation --   38 pounds   Left Shoulder External Rotation --   23 pounds                        [facility] Adult PT Treatment/Exercise - 07/16/20 0001      Exercises   Exercises Shoulder      Shoulder Exercises: Supine   Internal Rotation AROM;Left;20 reps;Other (comment)   10 seconds hold     Shoulder Exercises: Standing   Protraction Strengthening;Left;20 reps;Theraband    Theraband Level (Shoulder Protraction) Level 4 (Blue)    External Rotation  Left    Theraband Level (Shoulder External Rotation) Level 4 (Blue)    External Rotation Limitations 2 sets to fatigue    Row Strengthening;Both;20 reps;Other (comment)   3 seconds   Other Standing Exercises Thumb up the back stretch 10X 10 seconds    Other Standing Exercises Posterior capsule stretch 10X  10 seconds                  PT Education - 07/16/20 1744    Education Details Reviewed exam findings with empahsis on activities completed today.    Person(s) Educated Patient    Methods Explanation;Demonstration;Verbal cues;Handout    Comprehension Returned demonstration;Need further instruction;Verbal cues required;Verbalized understanding            PT Short Term Goals - 07/16/20 1744      PT SHORT TERM GOAL #1   Title Patient will be I with HEP    Baseline met    Time 2    Period Weeks    Status Achieved    Target Date 06/03/20      PT SHORT TERM GOAL #2   Title Patient will improve PROM to at least 140 flexion, ER 80, 140 abduction for improved function    Time 4    Period Weeks    Status Achieved    Target Date 06/17/20             PT Long Term Goals - 07/16/20 1744      PT LONG TERM GOAL #1   Title Patient will demonstrate increased ROM to within functional use for returning to leisure activities    Baseline On track to meet goals in 12 weeks    Time 10    Period Weeks    Status On-going      PT LONG TERM GOAL #2   Title Improve shoulder strength to offset atrophy from surgery to be back to 5/5 in all motions for increased ability to lift/carry/use tools    Baseline now 4-5/10    Time 10    Period Weeks    Status On-going      PT LONG TERM GOAL #3   Title Patient will have increased ROM to be able to swing a golf club with proper body mechanics to play a round of golf    Time 10    Period Weeks    Status On-going      PT LONG TERM GOAL #4   Title Patient will be able  to complete overhead activities without pain over 2/10 on NPS or  limitation for ability to return to volunteer work    Baseline Just now beginning light work with this but lacks strength and endurance with this    Time 10    Period Weeks    Status On-going      PT LONG TERM GOAL #5   Title Andrew Payne will be independent with his long-term HEP at DC.    Status On-going                 Plan - 07/16/20 1746    Clinical Impression Statement Andrew Payne is making excellent progress post-L RTC repair.  Strength is objectively 95% of the uninvolved R.  Mild AROM impairments are being addressed and 95% or better long-term AROM is expected.  Andrew Payne was encouraged to discuss return to golf time lines with Dr. Durward Fortes and finish his last 4 visits of PT to make sure mild remaining AROM impairments will meet long-term goals.  At 1-2X/week, Andrew Payne should transition into independent rehabilitation in 2-4 weeks.    Personal Factors and Comorbidities Age    Examination-Activity Limitations Reach Overhead;Lift;Carry    Examination-Participation Restrictions Cleaning;Community Activity;Volunteer    Stability/Clinical Decision Making Stable/Uncomplicated    Rehab Potential Excellent    PT Frequency 2x / week    PT Duration Other (comment)   10 weeks   PT Treatment/Interventions ADLs/Self Care Home Management;Functional mobility training;Therapeutic activities;Therapeutic exercise;Neuromuscular re-education;Manual techniques;Passive range of motion;Vasopneumatic Device;Cryotherapy;Electrical Stimulation;Iontophoresis 28m/ml Dexamethasone;Moist Heat;Ultrasound;Taping;Joint Manipulations;Dry needling    PT Next Visit Plan Progress as able, 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 Nontraumatic incomplete tear of left rotator cuff 03/13/2020  . Impingement syndrome of left shoulder 08/23/2019  . Unilateral primary osteoarthritis, right knee 11/01/2017  . Chest pain 08/07/2017  . Bradycardia 08/07/2017  . History of right tennis elbow 05/09/2017  . Right tennis elbow 04/07/2017  . Prolapsed internal hemorrhoids, grade 3 06/03/2016  . Hx of adenomatous colonic polyps 05/27/2016    RFarley LyPT, MPT 07/16/2020, 5:51 PM  [facility] Therapy [address] [address]Phone: [phone]  Fax:  [phone] Name: Andrew FUSSELLMRN: [id number] of Birth: [birth year]

## 2020-07-16 NOTE — Patient Instructions (Signed)
Access Code: [email or web address] URL: [email or web address] Date: 07/16/2020 Prepared by: Vista Mink  Exercises Standing shoulder flexion wall slides - 2 x daily - 6 x weekly - 10 reps - 1-2 sets Standing Shoulder Flexion to 90 Degrees - 2 x daily - 6 x weekly - 3 sets - 10 reps Standing Shoulder Abduction Full Range - 2 x daily - 6 x weekly - 3 sets - 10 reps Sidelying Shoulder External Rotation - 2 x daily - 6 x weekly - 3 sets - 15 reps Standing Shoulder Posterior Capsule Stretch - 2-3 x daily - 7 x weekly - 1 sets - 10 reps - 10 seconds hold Standing Shoulder Internal Rotation Stretch with Hands Behind Back - 2-3 x daily - 7 x weekly - 1 sets - 10 reps - 10 seconds hold Supine Scapular Protraction in Flexion with Dumbbells - 1 x daily - 7 x weekly - 2-3 sets - 20 reps - 3 seconds hold Supine Shoulder Internal Rotation Stretch - 2-3 x daily - 7 x weekly - 1 sets - 10-20 reps - 10 secondsinutes hold

## 2020-07-17 ENCOUNTER — Ambulatory Visit: Payer: PPO | Admitting: Orthopaedic Surgery

## 2020-07-17 ENCOUNTER — Encounter: Payer: Self-pay | Admitting: Orthopaedic Surgery

## 2020-07-17 VITALS — Ht 74.0 in | Wt 181.0 lb

## 2020-07-17 DIAGNOSIS — M75112 Incomplete rotator cuff tear or rupture of left shoulder, not specified as traumatic: Secondary | ICD-10-CM

## 2020-07-17 NOTE — Progress Notes (Signed)
Office Visit Note   Patient: Andrew Payne           Date of Birth: [birth year]           MRN: [id number] Visit Date: 07/17/2020              Requested by: Ginger Organ., MD [address] [place],  [place] [address] PCP: Ginger Organ., MD   Assessment & Plan: Visit Diagnoses:  1. Nontraumatic incomplete tear of left rotator cuff     Plan: 2-1/2 months status post rotator cuff tear repair left shoulder with an SCD and DCR.  He is improving in physical therapy.  Working on strengthening exercises at this point.  Not taking any pain medicines.  Not sure he can tell a difference in his pain from preoperative status.  Will continue working with strengthening exercise return in 1 month.  He is doing quite well in terms of his motion but he is weak  Follow-Up Instructions: Return in about 1 month (around 08/17/2020).   Orders:  No orders of the defined types were placed in this encounter.  No orders of the defined types were placed in this encounter.     Procedures: No procedures performed   Clinical Data: No additional findings.   Subjective: Chief Complaint  Patient presents with  . Left Shoulder - Follow-up    Left shoulder arthroscopy 05/08/2020  Patient presents today for follow up on his left shoulder. He had a left shoulder rotator cuff tear repair on 05/08/2020. He is now 10 weeks out from surgery. Patient states that he is doing well. He is going to physical therapy twice weekly. He is not taking anything for pain. He states that his therapist said he is way above schedule in all areas.  HPI  Review of Systems   Objective: Vital Signs: Ht 6\' 2"  (1.88 m)   Wt 181 lb (82.1 kg)   BMI 23.24 kg/m   Physical Exam  Ortho Exam left shoulder with well-healed incisions.  No localized areas of tenderness.  Just about full overhead motion.  Lacks just a little bit of internal/external rotation but not a true adhesive capsulitis.  Abducts easily over 90 degrees.   Biceps intact.  Good grip and release. Specialty Comments:  No specialty comments available.  Imaging: No results found.   PMFS History: Patient Active Problem List   Diagnosis Date Noted  . Nontraumatic incomplete tear of left rotator cuff 03/13/2020  . Impingement syndrome of left shoulder 08/23/2019  . Unilateral primary osteoarthritis, right knee 11/01/2017  . Chest pain 08/07/2017  . Bradycardia 08/07/2017  . History of right tennis elbow 05/09/2017  . Right tennis elbow 04/07/2017  . Prolapsed internal hemorrhoids, grade 3 06/03/2016  . Hx of adenomatous colonic polyps 05/27/2016   Past Medical History:  Diagnosis Date  . Bradycardia    cardiology-- dr Curt Bears--  work-up done included event monitor and echo (all in epic)  . Carotid artery stenosis    per pt H&P dated 10-20-2018 --  <40%  . Coronary atherosclerosis   . Glaucoma, right eye   . History of nuclear stress test 04/15/2005   (in epic)  for near syncope--- Low risk normal no ischemia, ef 57%  . Hx of adenomatous colonic polyps 05/27/2016  . Internal hemorrhoids   . Morton neuroma, left   . OA (osteoarthritis)    wrist, elbow, thumb, knees  . PVC's (premature ventricular contractions)   . RBBB (right  bundle branch block)    States heart rate has been as low as 28 and asyptomatic    Family History  Problem Relation Age of Onset  . Heart disease Mother   . Alzheimer's disease Father   . Colon polyps Father   . Colon cancer Neg Hx   . Pancreatic cancer Neg Hx   . Prostate cancer Neg Hx   . Rectal cancer Neg Hx   . Esophageal cancer Neg Hx     Past Surgical History:  Procedure Laterality Date  . COLONOSCOPY  05/2016   TA  . ELBOW SURGERY Left 04/2017  . EXCISION MORTON'S NEUROMA Left 10/23/2018   Procedure: EXCISION MORTON'S NEUROMA LEFT FOOT;  Surgeon: Rosemary Holms, DPM;  Location: [facility];  Service: Podiatry;  Laterality: Left;  . HEMORRHOID BANDING  2018  . INGUINAL HERNIA  REPAIR Left 06/14/2017   Procedure: LEFT INGUINAL HERNIA REPAIR ERAS PATHWAY;  Surgeon: Erroll Luna, MD;  Location: [facility];  Service: General;  Laterality: Left;  . INGUINAL HERNIA REPAIR Right 11-18-2009   dr Redmond Pulling  @[facility]   . INSERTION OF MESH Left 06/14/2017   Procedure: INSERTION OF MESH;  Surgeon: Erroll Luna, MD;  Location: [facility];  Service: General;  Laterality: Left;  . KNEE ARTHROSCOPY W/ MENISCAL REPAIR Right 09/2015  . POLYPECTOMY    . TONSILLECTOMY AND ADENOIDECTOMY  child   Social History   Occupational History  . Not on file  Tobacco Use  . Smoking status: Never Smoker  . Smokeless tobacco: Never Used  Vaping Use  . Vaping Use: Never used  Substance and Sexual Activity  . Alcohol use: Not Currently  . Drug use: No  . Sexual activity: Not on file

## 2020-07-18 ENCOUNTER — Encounter: Payer: Self-pay | Admitting: Rehabilitative and Restorative Service Providers"

## 2020-07-18 ENCOUNTER — Other Ambulatory Visit: Payer: Self-pay

## 2020-07-18 ENCOUNTER — Ambulatory Visit (INDEPENDENT_AMBULATORY_CARE_PROVIDER_SITE_OTHER): Payer: PPO | Admitting: Rehabilitative and Restorative Service Providers"

## 2020-07-18 DIAGNOSIS — R600 Localized edema: Secondary | ICD-10-CM

## 2020-07-18 DIAGNOSIS — M25512 Pain in left shoulder: Secondary | ICD-10-CM

## 2020-07-18 DIAGNOSIS — M6281 Muscle weakness (generalized): Secondary | ICD-10-CM | POA: Diagnosis not present

## 2020-07-18 DIAGNOSIS — M25612 Stiffness of left shoulder, not elsewhere classified: Secondary | ICD-10-CM | POA: Diagnosis not present

## 2020-07-18 DIAGNOSIS — G8929 Other chronic pain: Secondary | ICD-10-CM

## 2020-07-18 NOTE — Therapy (Signed)
[facility] [place], [place], [address] Phone: [phone]   Fax:  [phone]  Physical Therapy Treatment  Patient Details  Name: Andrew Payne MRN: [id number] Date of Birth: 1946-02-01 Referring Provider (PT): Joni Fears, MD   Encounter Date: 07/18/2020   PT End of Session - 07/18/20 1704    Visit Number 17    Number of Visits 20    Date for PT Re-Evaluation 08/13/20    Progress Note Due on Visit 20    PT Start Time 1015    PT Stop Time 1100    PT Time Calculation (min) 45 min    Activity Tolerance Patient tolerated treatment well;No increased pain    Behavior During Therapy WFL for tasks assessed/performed           Past Medical History:  Diagnosis Date  . Bradycardia    cardiology-- dr Curt Bears--  work-up done included event monitor and echo (all in epic)  . Carotid artery stenosis    per pt H&P dated 10-20-2018 --  <40%  . Coronary atherosclerosis   . Glaucoma, right eye   . History of nuclear stress test 04/15/2005   (in epic)  for near syncope--- Low risk normal no ischemia, ef 57%  . Hx of adenomatous colonic polyps 05/27/2016  . Internal hemorrhoids   . Morton neuroma, left   . OA (osteoarthritis)    wrist, elbow, thumb, knees  . PVC's (premature ventricular contractions)   . RBBB (right bundle branch block)    States heart rate has been as low as 28 and asyptomatic    Past Surgical History:  Procedure Laterality Date  . COLONOSCOPY  05/2016   TA  . ELBOW SURGERY Left 04/2017  . EXCISION MORTON'S NEUROMA Left 10/23/2018   Procedure: EXCISION MORTON'S NEUROMA LEFT FOOT;  Surgeon: Rosemary Holms, DPM;  Location: [facility];  Service: Podiatry;  Laterality: Left;  . HEMORRHOID BANDING  2018  . INGUINAL HERNIA REPAIR Left 06/14/2017   Procedure: LEFT INGUINAL HERNIA REPAIR ERAS PATHWAY;  Surgeon: Erroll Luna, MD;  Location: [facility];  Service: General;  Laterality:  Left;  . INGUINAL HERNIA REPAIR Right 11-18-2009   dr Redmond Pulling  '@[facility]'   . INSERTION OF MESH Left 06/14/2017   Procedure: INSERTION OF MESH;  Surgeon: Erroll Luna, MD;  Location: [facility];  Service: General;  Laterality: Left;  . KNEE ARTHROSCOPY W/ MENISCAL REPAIR Right 09/2015  . POLYPECTOMY    . TONSILLECTOMY AND ADENOIDECTOMY  child    There were no vitals filed for this visit.   Subjective Assessment - 07/18/20 1701    Subjective Andrew Payne got a good report from doctor Durward Fortes.  Return to golf delayed for a few more weeks.    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 Yes    Pain Score 3     Pain Location Shoulder    Pain Orientation Left    Pain Descriptors / Indicators Aching;Sore    Pain Type Chronic pain    Pain Onset More than a month ago    Pain Frequency Intermittent    Aggravating Factors  L shoulder use    Effect of Pain on Daily Activities Not yet able to return to golf    Multiple Pain Sites No  [facility] Adult PT Treatment/Exercise - 07/18/20 0001      Blood Flow Restriction   Blood Flow Restriction Yes      Blood Flow Restriction-Positions    Blood Flow Restriction Position Sitting      BFR Sitting   Sitting Limb Occulsion Pressure (mmHg) 115    Sitting Exercise Pressure (mmHg) 60    Sitting Exercise Prescription 30,15,15,15, reps w/ 30-60 sec rest      Exercises   Exercises Shoulder      Shoulder Exercises: Supine   Internal Rotation AROM;Left;20 reps;Other (comment)   10 seconds hold     Shoulder Exercises: Standing   Protraction Strengthening;Left;20 reps;Weights    Protraction Weight (lbs) 5#    External Rotation Left    Theraband Level (Shoulder External Rotation) Level 4 (Blue)    External Rotation Limitations with BFR 70 mm Hg  15/15/15    Row  Strengthening;Both;20 reps;Other (comment)   3 seconds   Other Standing Exercises Thumb up the back stretch 10X 10 seconds    Other Standing Exercises Posterior capsule stretch 10X  10 seconds                  PT Education - 07/18/20 1702    Education Details Spoke with Andrew Payne about giving his L shoulder a break over the weekend.  He has been hitting the gym in addition to his physical therapy and is starting to show some signs of overuse.    Person(s) Educated Patient    Methods Explanation;Verbal cues    Comprehension Verbalized understanding;Returned demonstration;Verbal cues required            PT Short Term Goals - 07/18/20 1703      PT SHORT TERM GOAL #1   Title Patient will be I with HEP    Baseline met    Time 2    Period Weeks    Status Achieved    Target Date 06/03/20      PT SHORT TERM GOAL #2   Title Patient will improve PROM to at least 140 flexion, ER 80, 140 abduction for improved function    Time 4    Period Weeks    Status Achieved    Target Date 06/17/20             PT Long Term Goals - 07/18/20 1703      PT LONG TERM GOAL #1   Title Patient will demonstrate increased ROM to within functional use for returning to leisure activities    Baseline On track to meet goals in 12 weeks    Time 10    Period Weeks    Status On-going      PT LONG TERM GOAL #2   Title Improve shoulder strength to offset atrophy from surgery to be back to 5/5 in all motions for increased ability to lift/carry/use tools    Baseline now 4-5/10    Time 10    Period Weeks    Status On-going      PT LONG TERM GOAL #3   Title Patient will have increased ROM to be able to swing a golf club with proper body mechanics to play a round of golf    Time 10    Period Weeks    Status On-going      PT LONG TERM GOAL #4   Title Patient will be able to complete overhead activities without pain over 2/10 on NPS or limitation for ability to return to volunteer  work    Baseline  Just now beginning light work with this but lacks strength and endurance with this    Time 10    Period Weeks    Status On-going      PT LONG TERM GOAL #5   Title Andrew Payne will be independent with his long-term HEP at DC.    Status On-going                 Plan - 07/18/20 1704    Clinical Impression Statement Andrew Payne got a good report from Dr. Durward Fortes yesterday.  Golf is delayed for a few weeks to allow additional time to heal.  Andrew Payne is also showing signs of overuse and was encouraged to take the weekend off.  It is recommend he do his strengthening no more than 3X/week upon re-starting his strength work to avoid an overuse/tendonitis (has been going to the gym and almost daily exercise for the shoulder).  With adequate rest and avoiding overdoing things, Andrew Payne should be able to start return to golf slowly in 2-4 weeks.    Personal Factors and Comorbidities Age    Examination-Activity Limitations Reach Overhead;Lift;Carry    Examination-Participation Restrictions Cleaning;Community Activity;Volunteer    Stability/Clinical Decision Making Stable/Uncomplicated    Rehab Potential Excellent    PT Frequency 2x / week    PT Duration Other (comment)   10 weeks   PT Treatment/Interventions ADLs/Self Care Home Management;Functional mobility training;Therapeutic activities;Therapeutic exercise;Neuromuscular re-education;Manual techniques;Passive range of motion;Vasopneumatic Device;Cryotherapy;Electrical Stimulation;Iontophoresis 57m/ml Dexamethasone;Moist Heat;Ultrasound;Taping;Joint Manipulations;Dry needling    PT Next Visit Plan Progress as able, 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 Nontraumatic incomplete tear of left rotator cuff 03/13/2020  . Impingement syndrome of left shoulder 08/23/2019  . Unilateral primary osteoarthritis, right knee 11/01/2017  . Chest pain 08/07/2017  . Bradycardia 08/07/2017  . History of right tennis elbow 05/09/2017  . Right tennis elbow 04/07/2017  . Prolapsed internal hemorrhoids, grade 3 06/03/2016  . Hx of adenomatous colonic polyps 05/27/2016    RFarley LyPT, MPT 07/18/2020, 5:07 PM  [facility] [address] [address]Phone: [phone]  Fax:  [phone] Name: Andrew GRYGIELMRN: [id number] of Birth: [birth year]

## 2020-07-21 ENCOUNTER — Other Ambulatory Visit: Payer: Self-pay

## 2020-07-21 ENCOUNTER — Encounter: Payer: Self-pay | Admitting: Cardiology

## 2020-07-21 ENCOUNTER — Ambulatory Visit: Payer: PPO | Admitting: Cardiology

## 2020-07-21 ENCOUNTER — Encounter: Payer: Self-pay | Admitting: *Deleted

## 2020-07-21 ENCOUNTER — Ambulatory Visit (INDEPENDENT_AMBULATORY_CARE_PROVIDER_SITE_OTHER): Payer: PPO

## 2020-07-21 VITALS — BP 130/78 | HR 62 | Ht 74.0 in | Wt 185.0 lb

## 2020-07-21 DIAGNOSIS — R002 Palpitations: Secondary | ICD-10-CM | POA: Diagnosis not present

## 2020-07-21 DIAGNOSIS — I493 Ventricular premature depolarization: Secondary | ICD-10-CM

## 2020-07-21 NOTE — Progress Notes (Signed)
Electrophysiology Office Note   Date:  07/21/2020   ID:  Andrew Payne, DOB [birth year], MRN [id number]  PCP:  Ginger Organ., MD  Cardiologist:   Primary Electrophysiologist:  Daveda Larock Meredith Leeds, MD    No chief complaint on file.    History of Present Illness: Andrew Payne is a 75 y.o. male who is being seen today for the evaluation of bradycardia at the request of Ladell Pier. Presenting today for electrophysiology evaluation.    He has a history significant for sick sinus syndrome, mild carotid stenosis, and PVCs.  He had a planned foot surgery 2 years ago where ECG showed sinus rhythm in the 40s.  He had a right bundle branch block.  He is also noted heart rates in the low 30s with a junctional escape.  He was sent to the emergency room and was no longer having P waves on their 3-lead ECG.  12-lead ECG showed a junctional rhythm with frequent PVCs.  At that time, he was able do all of his daily activities and no pacemaker was implanted.  Today, denies symptoms of palpitations, chest pain, shortness of breath, orthopnea, PND, lower extremity edema, claudication, dizziness, presyncope, syncope, bleeding, or neurologic sequela. The patient is tolerating medications without difficulties.  For the most part he has been feeling well.  He has no chest pain or shortness of breath.  He recently had rotator cuff surgery and had no issues with that.  He is currently undergoing physical therapy.  He states that he walks between 5 to 6 miles a day.  At times on his walks, he feels a sudden sense of dizziness, like his blood pressure is dropping.  He says that it block this last between 15 to 30 seconds.  There are no exacerbating or alleviating factors.  This only occurs when he is exercising.   Past Medical History:  Diagnosis Date  . Bradycardia    cardiology-- dr Curt Bears--  work-up done included event monitor and echo (all in epic)  . Carotid artery stenosis    per pt H&P dated  10-20-2018 --  <40%  . Coronary atherosclerosis   . Glaucoma, right eye   . History of nuclear stress test 04/15/2005   (in epic)  for near syncope--- Low risk normal no ischemia, ef 57%  . Hx of adenomatous colonic polyps 05/27/2016  . Internal hemorrhoids   . Morton neuroma, left   . OA (osteoarthritis)    wrist, elbow, thumb, knees  . PVC's (premature ventricular contractions)   . RBBB (right bundle branch block)    States heart rate has been as low as 28 and asyptomatic   Past Surgical History:  Procedure Laterality Date  . COLONOSCOPY  05/2016   TA  . ELBOW SURGERY Left 04/2017  . EXCISION MORTON'S NEUROMA Left 10/23/2018   Procedure: EXCISION MORTON'S NEUROMA LEFT FOOT;  Surgeon: Rosemary Holms, DPM;  Location: [facility] [place];  Service: Podiatry;  Laterality: Left;  . HEMORRHOID BANDING  2018  . INGUINAL HERNIA REPAIR Left 06/14/2017   Procedure: LEFT INGUINAL HERNIA REPAIR ERAS PATHWAY;  Surgeon: Erroll Luna, MD;  Location: [facility];  Service: General;  Laterality: Left;  . INGUINAL HERNIA REPAIR Right 11-18-2009   dr Redmond Pulling  @[facility]   . INSERTION OF MESH Left 06/14/2017   Procedure: INSERTION OF MESH;  Surgeon: Erroll Luna, MD;  Location: [facility];  Service: General;  Laterality: Left;  . KNEE ARTHROSCOPY W/ MENISCAL  REPAIR Right 09/2015  . POLYPECTOMY    . TONSILLECTOMY AND ADENOIDECTOMY  child     Current Outpatient Medications  Medication Sig Dispense Refill  . Cholecalciferol (VITAMIN D3) 1000 units CAPS Take 1 capsule by mouth daily.    . dorzolamide (TRUSOPT) 2 % ophthalmic solution Place 1 drop into the right eye daily.     Marland Kitchen latanoprost (XALATAN) 0.005 % ophthalmic solution Place 1 drop into the right eye at bedtime.     . Omega-3 Fatty Acids (FISH OIL) 1000 MG CAPS Take 1 capsule by mouth 2 (two) times daily.     No current facility-administered medications for this visit.    Allergies:   Oxycodone   Social  History:  The patient  reports that he has never smoked. He has never used smokeless tobacco. He reports previous alcohol use. He reports that he does not use drugs.   Family History:  The patient's family history includes Alzheimer's disease in his father; Colon polyps in his father; Heart disease in his mother.   ROS:  Please see the history of present illness.   Otherwise, review of systems is positive for none.   All other systems are reviewed and negative.   PHYSICAL EXAM: VS:  BP 130/78   Pulse 62   Ht 6\' 2"  (1.88 m)   Wt 185 lb (83.9 kg)   BMI 23.75 kg/m  , BMI Body mass index is 23.75 kg/m. GEN: Well nourished, well developed, in no acute distress  HEENT: normal  Neck: no JVD, carotid bruits, or masses Cardiac: RRR; no murmurs, rubs, or gallops,no edema  Respiratory:  clear to auscultation bilaterally, normal work of breathing GI: soft, nontender, nondistended, + BS MS: no deformity or atrophy  Skin: warm and dry Neuro:  Strength and sensation are intact Psych: euthymic mood, full affect  EKG:  EKG is ordered today. Personal review of the ekg ordered shows sinus rhythm, ventricular bigeminy, rate of 62, right bundle branch block  Recent Labs: No results found for requested labs within last 8760 hours.    Lipid Panel     Component Value Date/Time   CHOL 182 08/08/2017 0032   TRIG 59 08/08/2017 0032   HDL 54 08/08/2017 0032   CHOLHDL 3.4 08/08/2017 0032   VLDL 12 08/08/2017 0032   LDLCALC 116 (H) 08/08/2017 0032     Wt Readings from Last 3 Encounters:  07/21/20 185 lb (83.9 kg)  07/17/20 181 lb (82.1 kg)  06/19/20 181 lb (82.1 kg)      Other studies Reviewed: Additional studies/ records that were reviewed today include: Coronary CTA 08/09/2017 Review of the above records today demonstrates:  1. Left Main:  No significant stenosis.  2. LAD: No significant stenosis. 3. LCX: No significant stenosis. 4. RCA: No significant stenosis.  IMPRESSION: 1.  CT  FFR analysis didn't show any significant stenosis.  Cardiac monitor 08/03/2018 personally reviewed Max 171 bpm 04:40pm, 04/03 Min 31 bpm 05:14am, 04/04 Avg 50 bpm Bradycardia occurred during likely sleeping hours. Multiple episodes of the tachycardia that appeared to be due to SVT.  Longest episode 9.2 seconds. 0 atrial fibrillation noted.  TTE 09/01/2018 1. The left ventricle has normal systolic function, with an ejection  fraction of 55-60%. The cavity size was normal. Left ventricular diastolic  parameters were normal. No evidence of left ventricular regional wall  motion abnormalities.  2. The right ventricle has normal systolic function. The cavity was  normal. There is no increase in right ventricular  wall thickness.  3. Left atrial size was mildly dilated.  4. Mild thickening of the mitral valve leaflet. There is mild mitral  annular calcification present.  5. The aortic valve is tricuspid. Mild thickening of the aortic valve.  Mild calcification of the aortic valve. Mild aortic annular calcification  noted.  6. The aortic root is normal in size and structure.   ASSESSMENT AND PLAN:  1.  Sick sinus syndrome: The patient is currently in ventricular bigeminy.  Is difficult to tell what his sinus rates are on ECG today.  He is having episodes of severe dizziness when he walks.  We Ardath Lepak have him wear a 7-day monitor and have told him to do his normal daily activities while wearing the monitor.  2.  PVCs: 4.3% PVCs on most recent monitor.  He is in ventricular bigeminy today.  Due to that, we Macaela Presas have him wear a 7-day monitor to further determine if arrhythmias are the cause of his symptoms.  Current medicines are reviewed at length with the patient today.   The patient does not have concerns regarding his medicines.  The following changes were made today: None  Labs/ tests ordered today include:  Orders Placed This Encounter  Procedures  . LONG TERM MONITOR (3-14 DAYS)  .  EKG 12-Lead     Disposition:   FU with Floriene Jeschke pending monitor months  Signed, Marnisha Stampley Meredith Leeds, MD  07/21/2020 8:47 AM     [address] [place] [address] [phone] (office) [phone] (fax)

## 2020-07-21 NOTE — Progress Notes (Signed)
Patient ID: Andrew Payne, male   DOB: 07-Aug-1945, 75 y.o.   MRN: [id number] Patient enrolled for [employer] to ship a 7 day ZIO XT long term holter monitor to his home.

## 2020-07-21 NOTE — Patient Instructions (Signed)
Medication Instructions:  Your physician recommends that you continue on your current medications as directed. Please refer to the Current Medication list given to you today.  *If you need a refill on your cardiac medications before your next appointment, please call your pharmacy*   Lab Work: None ordered   Testing/Procedures:                          ZIO XT- Long Term Monitor Instructions   Your physician has requested you wear your ZIO patch monitor for 7 days.   This is a single patch monitor.  [employer] supplies one patch monitor per enrollment.  Additional stickers are not available.   Please do not apply patch if you will be having a Nuclear Stress Test, Echocardiogram, Cardiac CT, MRI, or Chest Xray during the time frame you would be wearing the monitor. The patch cannot be worn during these tests.  You cannot remove and re-apply the ZIO XT patch monitor.   Your ZIO patch monitor will be sent USPS Priority mail from [facility] directly to your home address. The monitor may also be mailed to a PO BOX if home delivery is not available.   It may take 3-5 days to receive your monitor after you have been enrolled.   Once you have received you monitor, please review enclosed instructions.  Your monitor has already been registered assigning a specific monitor serial # to you.   Applying the monitor   Shave hair from upper left chest.   Hold abrader disc by orange tab.  Rub abrader in 40 strokes over left upper chest as indicated in your monitor instructions.   Clean area with 4 enclosed alcohol pads .  Use all pads to assure are is cleaned thoroughly.  Let dry.   Apply patch as indicated in monitor instructions.  Patch will be place under collarbone on left side of chest with arrow pointing upward.   Rub patch adhesive wings for 2 minutes.Remove white label marked "1".  Remove white label marked "2".  Rub patch adhesive wings for 2 additional minutes.   While looking in a  mirror, press and release button in center of patch.  A small green light will flash 3-4 times .  This will be your only indicator the monitor has been turned on.     Do not shower for the first 24 hours.  You may shower after the first 24 hours.   Press button if you feel a symptom. You will hear a small click.  Record Date, Time and Symptom in the Patient Log Book.   When you are ready to remove patch, follow instructions on last 2 pages of Patient Log Book.  Stick patch monitor onto last page of Patient Log Book.   Place Patient Log Book in [place] box.  Use locking tab on box and tape box closed securely.  The [employer] has [employer] on it.  Please place in mailbox as soon as possible.  Your physician should have your test results approximately 7 days after the monitor has been mailed back to [facility].   Call [facility] at [phone] if you have questions regarding your ZIO XT patch monitor.  Call them immediately if you see an orange light blinking on your monitor.   If your monitor falls off in less than 4 days contact our Monitor department at [phone].  If your monitor becomes loose or falls  off after 4 days call [employer] at [phone] for suggestions on securing your monitor.     Follow-Up: At [facility], you and your health needs are our priority.  As part of our continuing mission to provide you with exceptional heart care, we have created designated Provider Care Teams.  These Care Teams include your primary Cardiologist (physician) and Advanced Practice Providers (APPs -  Physician Assistants and Nurse Practitioners) who all work together to provide you with the care you need, when you need it.  Your next appointment:    to be determined after monitor is completed/reviewed  The format for your next appointment:   In Person  Provider:   Allegra Lai, MD    Thank you for choosing [facility]!!   Trinidad Curet, RN [phone]

## 2020-07-22 ENCOUNTER — Ambulatory Visit: Payer: PPO | Admitting: Physical Therapy

## 2020-07-22 DIAGNOSIS — R600 Localized edema: Secondary | ICD-10-CM | POA: Diagnosis not present

## 2020-07-22 DIAGNOSIS — M25512 Pain in left shoulder: Secondary | ICD-10-CM | POA: Diagnosis not present

## 2020-07-22 DIAGNOSIS — M25612 Stiffness of left shoulder, not elsewhere classified: Secondary | ICD-10-CM | POA: Diagnosis not present

## 2020-07-22 DIAGNOSIS — G8929 Other chronic pain: Secondary | ICD-10-CM | POA: Diagnosis not present

## 2020-07-22 DIAGNOSIS — M6281 Muscle weakness (generalized): Secondary | ICD-10-CM | POA: Diagnosis not present

## 2020-07-22 NOTE — Therapy (Signed)
[place], [place], [address] Phone: [phone]   Fax:  [phone]  Physical Therapy Treatment  Patient Details  Name: Andrew Payne MRN: [id number] Date of Birth: May 26, 1945 Referring Provider (PT): Joni Fears, MD   Encounter Date: 07/22/2020   PT End of Session - 07/22/20 1031    Visit Number 18    Number of Visits 20    Date for PT Re-Evaluation 08/13/20    Progress Note Due on Visit 20    PT Start Time 1324    PT Stop Time 1102    PT Time Calculation (min) 47 min    Activity Tolerance Patient tolerated treatment well;No increased pain    Behavior During Therapy WFL for tasks assessed/performed           Past Medical History:  Diagnosis Date  . Bradycardia    cardiology-- dr Curt Bears--  work-up done included event monitor and echo (all in epic)  . Carotid artery stenosis    per pt H&P dated 10-20-2018 --  <40%  . Coronary atherosclerosis   . Glaucoma, right eye   . History of nuclear stress test 04/15/2005   (in epic)  for near syncope--- Low risk normal no ischemia, ef 57%  . Hx of adenomatous colonic polyps 05/27/2016  . Internal hemorrhoids   . Morton neuroma, left   . OA (osteoarthritis)    wrist, elbow, thumb, knees  . PVC's (premature ventricular contractions)   . RBBB (right bundle branch block)    States heart rate has been as low as 28 and asyptomatic    Past Surgical History:  Procedure Laterality Date  . COLONOSCOPY  05/2016   TA  . ELBOW SURGERY Left 04/2017  . EXCISION MORTON'S NEUROMA Left 10/23/2018   Procedure: EXCISION MORTON'S NEUROMA LEFT FOOT;  Surgeon: Rosemary Holms, DPM;  Location: [facility];  Service: Podiatry;  Laterality: Left;  . HEMORRHOID BANDING  2018  . INGUINAL HERNIA REPAIR Left 06/14/2017   Procedure: LEFT INGUINAL HERNIA REPAIR ERAS PATHWAY;  Surgeon: Erroll Luna, MD;  Location: [facility];  Service: General;  Laterality:  Left;  . INGUINAL HERNIA REPAIR Right 11-18-2009   dr Redmond Pulling  '@[facility]'   . INSERTION OF MESH Left 06/14/2017   Procedure: INSERTION OF MESH;  Surgeon: Erroll Luna, MD;  Location: [facility] [place];  Service: General;  Laterality: Left;  . KNEE ARTHROSCOPY W/ MENISCAL REPAIR Right 09/2015  . POLYPECTOMY    . TONSILLECTOMY AND ADENOIDECTOMY  child    There were no vitals filed for this visit.   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, golfing    Pain Onset More than a month ago           [facility] Adult PT Treatment/Exercise - 07/22/20 0001      Shoulder Exercises: Prone   Other Prone Exercises --      Shoulder Exercises: Sidelying   External Rotation Strengthening;Left    External Rotation Weight (lbs) 5    External Rotation Limitations with BFR 70 mmHG 15, 15, 15      Shoulder Exercises: Standing   Horizontal ABduction Both    Theraband Level (Shoulder Horizontal ABduction) Level 3 (Green)    Horizontal ABduction Limitations with BFR  17mHG  30,15,15    External Rotation Left    Theraband Level (Shoulder External Rotation) Level 4 (Blue)    External Rotation Limitations reps of 30, 15, 15 at 90/90 deg with bfr    Internal Rotation Left    Theraband Level (Shoulder Internal Rotation) Level 4 (Blue)    Internal Rotation Limitations reps of 30, 15, 15 at 90/90 deg with bfr    Other Standing Exercises cable machine curl into press on Lt with 10# 15,15,15 with BFR    Other Standing Exercises Posterior capsule stretch 10X  10 seconds, with MET contract-relax      Shoulder Exercises: Pulleys   Flexion 2 minutes    ABduction 2 minutes    Other Pulley Exercises IR 2 min      Shoulder Exercises: ROM/Strengthening   UBE (Upper Arm Bike) L5, 3 min fwd, 3  min  retro    [place] 3# X 15 reps for flexion, abduction, cirlces in flexion CW,CCW all with BFR 70 mmHG                    PT Short Term Goals - 07/18/20 1703      PT SHORT TERM GOAL #1   Title Patient will be I with HEP    Baseline met    Time 2    Period Weeks    Status Achieved    Target Date 06/03/20      PT SHORT TERM GOAL #2   Title Patient will improve PROM to at least 140 flexion, ER 80, 140 abduction for improved function    Time 4    Period Weeks    Status Achieved    Target Date 06/17/20             PT Long Term Goals - 07/18/20 1703      PT LONG TERM GOAL #1   Title Patient will demonstrate increased ROM to within functional use for returning to leisure activities    Baseline On track to meet goals in 12 weeks    Time 10    Period Weeks    Status On-going      PT LONG TERM GOAL #2   Title Improve shoulder strength to offset atrophy from surgery to be back to 5/5 in all motions for increased ability to lift/carry/use tools    Baseline now 4-5/10    Time 10    Period Weeks    Status On-going      PT LONG TERM GOAL #3   Title Patient will have increased ROM to be able to swing a golf club with proper body mechanics to play a round of golf    Time 10    Period Weeks    Status On-going      PT LONG TERM GOAL #4   Title Patient will be able to complete overhead activities without pain over 2/10 on NPS or limitation for ability to return to volunteer work    Baseline Just now beginning light work with this but lacks strength and endurance with this    Time 10    Period Weeks    Status On-going      PT LONG TERM GOAL #5   Title Andrew Schoonerwill be independent with his long-term HEP at [place].    Status On-going                 Plan - 07/22/20 1107    Clinical Impression Statement He still  has some pain with IR movements and with ER strengthening but says this is no more than a 3/10 and then resolves when he stops. This should continue to slowly  improve with more time and PT.    Personal Factors and Comorbidities Age    Examination-Activity Limitations Reach Overhead;Lift;Carry    Examination-Participation Restrictions Cleaning;Community Activity;Volunteer    Stability/Clinical Decision Making Stable/Uncomplicated    Rehab Potential Excellent    PT Frequency 2x / week    PT Duration Other (comment)   10 weeks   PT Treatment/Interventions ADLs/Self Care Home Management;Functional mobility training;Therapeutic activities;Therapeutic exercise;Neuromuscular re-education;Manual techniques;Passive range of motion;Vasopneumatic Device;Cryotherapy;Electrical Stimulation;Iontophoresis 1m/ml Dexamethasone;Moist Heat;Ultrasound;Taping;Joint Manipulations;Dry needling    PT Next Visit Plan Progress as able, 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 Nontraumatic incomplete tear of left rotator cuff 03/13/2020  . Impingement syndrome of left shoulder 08/23/2019  . Unilateral primary osteoarthritis, right knee 11/01/2017  . Chest pain 08/07/2017  . Bradycardia 08/07/2017  . History of right tennis elbow 05/09/2017  . Right tennis elbow 04/07/2017  . Prolapsed internal hemorrhoids, grade 3 06/03/2016  . Hx of adenomatous colonic polyps 05/27/2016    BSilvestre Mesi3/29/2022, 11:09 AM  [facility]  [facility]rapy [address] [address]Phone: [phone]  Fax:  [phone] Name: DKHALEL ALMSMRN: [id number] of Birth: [birth year]

## 2020-07-24 DIAGNOSIS — I493 Ventricular premature depolarization: Secondary | ICD-10-CM

## 2020-07-24 DIAGNOSIS — R002 Palpitations: Secondary | ICD-10-CM

## 2020-07-25 ENCOUNTER — Other Ambulatory Visit: Payer: Self-pay

## 2020-07-25 ENCOUNTER — Ambulatory Visit: Payer: PPO | Admitting: Physical Therapy

## 2020-07-25 DIAGNOSIS — G8929 Other chronic pain: Secondary | ICD-10-CM

## 2020-07-25 DIAGNOSIS — M25512 Pain in left shoulder: Secondary | ICD-10-CM

## 2020-07-25 DIAGNOSIS — R600 Localized edema: Secondary | ICD-10-CM

## 2020-07-25 DIAGNOSIS — M25612 Stiffness of left shoulder, not elsewhere classified: Secondary | ICD-10-CM

## 2020-07-25 DIAGNOSIS — M6281 Muscle weakness (generalized): Secondary | ICD-10-CM | POA: Diagnosis not present

## 2020-07-25 NOTE — Therapy (Signed)
[facility] [place], [place], [address] Phone: [phone]   Fax:  [phone]  Physical Therapy Treatment  Patient Details  Name: Andrew Payne MRN: [id number] Date of Birth: 06-Oct-1945 Referring Provider (PT): Joni Fears, MD   Encounter Date: 07/25/2020   PT End of Session - 07/25/20 1118    Visit Number 19    Number of Visits 20    Date for PT Re-Evaluation 08/13/20    Progress Note Due on Visit 20    PT Start Time 1015    PT Stop Time 1105    PT Time Calculation (min) 50 min    Activity Tolerance Patient tolerated treatment well;No increased pain    Behavior During Therapy WFL for tasks assessed/performed           Past Medical History:  Diagnosis Date  . Bradycardia    cardiology-- dr Curt Bears--  work-up done included event monitor and echo (all in epic)  . Carotid artery stenosis    per pt H&P dated 10-20-2018 --  <40%  . Coronary atherosclerosis   . Glaucoma, right eye   . History of nuclear stress test 04/15/2005   (in epic)  for near syncope--- Low risk normal no ischemia, ef 57%  . Hx of adenomatous colonic polyps 05/27/2016  . Internal hemorrhoids   . Morton neuroma, left   . OA (osteoarthritis)    wrist, elbow, thumb, knees  . PVC's (premature ventricular contractions)   . RBBB (right bundle branch block)    States heart rate has been as low as 28 and asyptomatic    Past Surgical History:  Procedure Laterality Date  . COLONOSCOPY  05/2016   TA  . ELBOW SURGERY Left 04/2017  . EXCISION MORTON'S NEUROMA Left 10/23/2018   Procedure: EXCISION MORTON'S NEUROMA LEFT FOOT;  Surgeon: Rosemary Holms, DPM;  Location: [place];  Service: Podiatry;  Laterality: Left;  . HEMORRHOID BANDING  2018  . INGUINAL HERNIA REPAIR Left 06/14/2017   Procedure: LEFT INGUINAL HERNIA REPAIR ERAS PATHWAY;  Surgeon: Erroll Luna, MD;  Location: [place];  Service: General;  Laterality:  Left;  . INGUINAL HERNIA REPAIR Right 11-18-2009   dr Redmond Pulling  '@[facility]'   . INSERTION OF MESH Left 06/14/2017   Procedure: INSERTION OF MESH;  Surgeon: Erroll Luna, MD;  Location: [place];  Service: General;  Laterality: Left;  . KNEE ARTHROSCOPY W/ MENISCAL REPAIR Right 09/2015  . POLYPECTOMY    . TONSILLECTOMY AND ADENOIDECTOMY  child    There were no vitals filed for this visit.   Subjective Assessment - 07/25/20 1023    Subjective Dave relays shoulder pain is tolerable, still with some pressure with IR. He will visit driving range today for light golf swings to test it out.    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 cable machine curl into press on Lt with 10# 15,15,15 with BFR. face pulls (bilat ER and extension at 90/90 with BFR 15# 3X15    Other Standing Exercises Posterior capsule stretch 10X  10 seconds, with MET contract-relax, wall  slide flexion with foam roll 10 sec X 10, IR stretch behind back with strap 10 sec X 10      Shoulder Exercises: Pulleys   Flexion 2 minutes    ABduction 2 minutes      Shoulder Exercises: ROM/Strengthening   UBE (Upper Arm Bike) L5, 3 min fwd, 3  min retro      Manual Therapy   Manual therapy comments Lt shoulder PROM all planes, GH mobs A-P, P-A, inferior grade 3-4, thoacic central PA mobs, scapular mobs                    PT Short Term Goals - 07/18/20 1703      PT SHORT TERM GOAL #1   Title Patient will be I with HEP    Baseline met    Time 2    Period Weeks    Status Achieved    Target Date 06/03/20      PT SHORT TERM GOAL #2   Title Patient will improve PROM to at  least 140 flexion, ER 80, 140 abduction for improved function    Time 4    Period Weeks    Status Achieved    Target Date 06/17/20             PT Long Term Goals - 07/18/20 1703      PT LONG TERM GOAL #1   Title Patient will demonstrate increased ROM to within functional use for returning to leisure activities    Baseline On track to meet goals in 12 weeks    Time 10    Period Weeks    Status On-going      PT LONG TERM GOAL #2   Title Improve shoulder strength to offset atrophy from surgery to be back to 5/5 in all motions for increased ability to lift/carry/use tools    Baseline now 4-5/10    Time 10    Period Weeks    Status On-going      PT LONG TERM GOAL #3   Title Patient will have increased ROM to be able to swing a golf club with proper body mechanics to play a round of golf    Time 10    Period Weeks    Status On-going      PT LONG TERM GOAL #4   Title Patient will be able to complete overhead activities without pain over 2/10 on NPS or 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    Status On-going                 Plan - 07/25/20 1118    Clinical Impression Statement Focused more on mobility today as he still has mild limitation with this. His strength is coming along great overall. He will attempt to hit golf shots today so we will assess his response to this next week.    Personal Factors and Comorbidities Age    Examination-Activity Limitations Reach Overhead;Lift;Carry    Examination-Participation Restrictions Cleaning;Community Activity;Volunteer    Stability/Clinical Decision Making Stable/Uncomplicated    Rehab Potential Excellent    PT Frequency 2x / week    PT  Duration Other (comment)   10 weeks   PT  Treatment/Interventions ADLs/Self Care Home Management;Functional mobility training;Therapeutic activities;Therapeutic exercise;Neuromuscular re-education;Manual techniques;Passive range of motion;Vasopneumatic Device;Cryotherapy;Electrical Stimulation;Iontophoresis 46m/ml Dexamethasone;Moist Heat;Ultrasound;Taping;Joint Manipulations;Dry needling    PT Next Visit Plan Progress as able, 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 Nontraumatic incomplete tear of left rotator cuff 03/13/2020  . Impingement syndrome of left shoulder 08/23/2019  . Unilateral primary osteoarthritis, right knee 11/01/2017  . Chest pain 08/07/2017  . Bradycardia 08/07/2017  . History of right tennis elbow 05/09/2017  . Right tennis elbow 04/07/2017  . Prolapsed internal hemorrhoids, grade 3 06/03/2016  . Hx of adenomatous colonic polyps 05/27/2016    BSilvestre Mesi4/04/2020, 11:21 AM  [facility] [address] [address]Phone: [phone]  Fax:  [phone] Name: DMAALIK PINNMRN: [id number] of Birth: [birth year]

## 2020-07-29 ENCOUNTER — Ambulatory Visit: Payer: PPO | Admitting: Physical Therapy

## 2020-07-29 ENCOUNTER — Other Ambulatory Visit: Payer: Self-pay

## 2020-07-29 DIAGNOSIS — M6281 Muscle weakness (generalized): Secondary | ICD-10-CM

## 2020-07-29 DIAGNOSIS — M25612 Stiffness of left shoulder, not elsewhere classified: Secondary | ICD-10-CM

## 2020-07-29 DIAGNOSIS — R600 Localized edema: Secondary | ICD-10-CM

## 2020-07-29 DIAGNOSIS — G8929 Other chronic pain: Secondary | ICD-10-CM | POA: Diagnosis not present

## 2020-07-29 DIAGNOSIS — M25512 Pain in left shoulder: Secondary | ICD-10-CM | POA: Diagnosis not present

## 2020-07-29 NOTE — Therapy (Signed)
[facility] Physical Therapy [address] [place], [place], [address] Phone: [phone]   Fax:  [phone]  Physical Therapy Treatment/Progress note Progress Note reporting period date 06/24/20 to 07/29/20  See below for objective and subjective measurements relating to patients progress with PT.    Patient Details  Name: Andrew Payne MRN: [id number] Date of Birth: 1945/11/24 Referring Provider (PT): Joni Fears, MD   Encounter Date: 07/29/2020   PT End of Session - 07/29/20 1206    Visit Number 20    Number of Visits 20    Date for PT Re-Evaluation 08/13/20    Progress Note Due on Visit 20    PT Start Time 3128    PT Stop Time 1102    PT Time Calculation (min) 47 min    Activity Tolerance Patient tolerated treatment well;No increased pain    Behavior During Therapy WFL for tasks assessed/performed           Past Medical History:  Diagnosis Date  . Bradycardia    cardiology-- dr Curt Bears--  work-up done included event monitor and echo (all in epic)  . Carotid artery stenosis    per pt H&P dated 10-20-2018 --  <40%  . Coronary atherosclerosis   . Glaucoma, right eye   . History of nuclear stress test 04/15/2005   (in epic)  for near syncope--- Low risk normal no ischemia, ef 57%  . Hx of adenomatous colonic polyps 05/27/2016  . Internal hemorrhoids   . Morton neuroma, left   . OA (osteoarthritis)    wrist, elbow, thumb, knees  . PVC's (premature ventricular contractions)   . RBBB (right bundle branch block)    States heart rate has been as low as 28 and asyptomatic    Past Surgical History:  Procedure Laterality Date  . COLONOSCOPY  05/2016   TA  . ELBOW SURGERY Left 04/2017  . EXCISION MORTON'S NEUROMA Left 10/23/2018   Procedure: EXCISION MORTON'S NEUROMA LEFT FOOT;  Surgeon: Rosemary Holms, DPM;  Location: [place];  Service: Podiatry;  Laterality: Left;  . HEMORRHOID BANDING  2018  . INGUINAL HERNIA REPAIR Left  06/14/2017   Procedure: LEFT INGUINAL HERNIA REPAIR ERAS PATHWAY;  Surgeon: Erroll Luna, MD;  Location: [place];  Service: General;  Laterality: Left;  . INGUINAL HERNIA REPAIR Right 11-18-2009   dr Redmond Pulling  _0   . INSERTION OF MESH Left 06/14/2017   Procedure: INSERTION OF MESH;  Surgeon: Erroll Luna, MD;  Location: [place];  Service: General;  Laterality: Left;  . KNEE ARTHROSCOPY W/ MENISCAL REPAIR Right 09/2015  . POLYPECTOMY    . TONSILLECTOMY AND ADENOIDECTOMY  child    There were no vitals filed for this visit.   Subjective Assessment - 07/29/20 1046    Subjective Dave relays shoulder pain is tolerable, did have some sorness after hitting some golf balls but overall his shoulder did good with this he reports other than soreness and fatigue.    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, golfing    Pain Onset More than a month ago              [facility] PT Assessment - 07/29/20 0001      Assessment   Medical Diagnosis L shoulder RTC repair    Referring Provider (PT) Joni Fears, MD    Onset Date/Surgical Date 05/08/20  PROM   Left Shoulder Flexion 160 Degrees    Left Shoulder ABduction --   WNL   Left Shoulder Internal Rotation 60 Degrees    Left Shoulder External Rotation --   WNL     Strength   Right Shoulder Internal Rotation --   38 lbs   Right Shoulder External Rotation --   26 lbs   Left Shoulder Internal Rotation --   38 lbs   Left Shoulder External Rotation --   23 lbs                        [facility] Adult PT Treatment/Exercise - 07/29/20 0001      Shoulder Exercises: Supine   Other Supine Exercises chest fly bilat 5 lbs 30, 15, 15 with BFR      Shoulder Exercises: Standing   Horizontal ABduction Both    Theraband Level (Shoulder Horizontal ABduction) Level 3 (Green)    Horizontal ABduction  Limitations with BFR 23mHG  30,15,15    External Rotation Both    Theraband Level (Shoulder External Rotation) Level 3 (Green)    External Rotation Limitations reps of 30, 15, 15 with BFR    Other Standing Exercises Ranger with 3# X 15 flexion, abd, circles with BFR      Shoulder Exercises: Pulleys   Flexion 2 minutes    ABduction 2 minutes      Shoulder Exercises: ROM/Strengthening   UBE (Upper Arm Bike) L5, 3 min fwd, 3  min retro      Manual Therapy   Manual therapy comments Lt shoulder PROM all planes, GH mobs A-P, P-A, inferior grade 3-4, thoacic central PA mobs, scapular mobs                    PT Short Term Goals - 07/18/20 1703      PT SHORT TERM GOAL #1   Title Patient will be I with HEP    Baseline met    Time 2    Period Weeks    Status Achieved    Target Date 06/03/20      PT SHORT TERM GOAL #2   Title Patient will improve PROM to at least 140 flexion, ER 80, 140 abduction for improved function    Time 4    Period Weeks    Status Achieved    Target Date 06/17/20             PT Long Term Goals - 07/29/20 1210      PT LONG TERM GOAL #1   Title Patient will demonstrate increased ROM to within functional use for returning to leisure activities    Baseline On track to meet goals in 12 weeks    Time 10    Period Weeks    Status On-going      PT LONG TERM GOAL #2   Title Improve shoulder strength to offset atrophy from surgery to be back to 5/5 in all motions for increased ability to lift/carry/use tools    Baseline now 4+ to 5-    Time 10    Period Weeks    Status On-going      PT LONG TERM GOAL #3   Title Patient will have increased ROM to be able to swing a golf club with proper body mechanics to play a round of golf    Baseline now can do    Time 10    Period Weeks  Status Achieved      PT LONG TERM GOAL #4   Title Patient will be able to complete overhead activities without pain over 2/10 on NPS or limitation for ability to return  to volunteer work    Baseline Just now beginning light work with this but lacks strength and endurance with this    Time 10    Period Weeks    Status On-going      PT LONG TERM GOAL #5   Title Andrew Payne will be independent with his long-term HEP at [place].    Status Achieved                 Plan - 07/29/20 1207    Clinical Impression Statement Overall has done well with PT and has almost met PT goals. He still has not yet been able to fully return to golf or working with tools but anticipate he will be ready for this in 4 weeks and we are working to gradually progress him in these areas.    Personal Factors and Comorbidities Age    Examination-Activity Limitations Reach Overhead;Lift;Carry    Examination-Participation Restrictions Cleaning;Community Activity;Volunteer    Stability/Clinical Decision Making Stable/Uncomplicated    Rehab Potential Excellent    PT Frequency 2x / week    PT Duration Other (comment)   10 weeks   PT Treatment/Interventions ADLs/Self Care Home Management;Functional mobility training;Therapeutic activities;Therapeutic exercise;Neuromuscular re-education;Manual techniques;Passive range of motion;Vasopneumatic Device;Cryotherapy;Electrical Stimulation;Iontophoresis 16m/ml Dexamethasone;Moist Heat;Ultrasound;Taping;Joint Manipulations;Dry needling    PT Next Visit Plan Progress as able, 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 Nontraumatic incomplete tear of left rotator cuff 03/13/2020  . Impingement syndrome of left shoulder 08/23/2019  . Unilateral primary osteoarthritis, right knee 11/01/2017  . Chest pain 08/07/2017  . Bradycardia 08/07/2017  . History of right tennis elbow 05/09/2017  . Right tennis elbow 04/07/2017  . Prolapsed internal hemorrhoids, grade 3 06/03/2016  . Hx of adenomatous colonic polyps 05/27/2016    BSilvestre Mesi4/08/2020, 12:12 PM  [facility] [address] [address]Phone: [phone]  Fax:  [phone] Name: DCLOYDE OREGELMRN: [id number] of Birth: [birth year]

## 2020-08-01 ENCOUNTER — Ambulatory Visit: Payer: PPO | Admitting: Physical Therapy

## 2020-08-01 ENCOUNTER — Other Ambulatory Visit: Payer: Self-pay

## 2020-08-01 DIAGNOSIS — M6281 Muscle weakness (generalized): Secondary | ICD-10-CM | POA: Diagnosis not present

## 2020-08-01 DIAGNOSIS — R600 Localized edema: Secondary | ICD-10-CM

## 2020-08-01 DIAGNOSIS — G8929 Other chronic pain: Secondary | ICD-10-CM

## 2020-08-01 DIAGNOSIS — M25512 Pain in left shoulder: Secondary | ICD-10-CM | POA: Diagnosis not present

## 2020-08-01 DIAGNOSIS — M25612 Stiffness of left shoulder, not elsewhere classified: Secondary | ICD-10-CM | POA: Diagnosis not present

## 2020-08-01 NOTE — Therapy (Signed)
[facility], [place], [address] Phone: [phone]   Fax:  [phone]  Physical Therapy Treatment  Patient Details  Name: Andrew Payne MRN: [id number] Date of Birth: 02-Nov-1945 Referring Provider (PT): Joni Fears, MD   Encounter Date: 08/01/2020   PT End of Session - 08/01/20 1122    Visit Number 21    Number of Visits 22    Date for PT Re-Evaluation 08/08/20    PT Start Time 3202    PT Stop Time 1105    PT Time Calculation (min) 50 min    Activity Tolerance Patient tolerated treatment well;No increased pain    Behavior During Therapy WFL for tasks assessed/performed           Past Medical History:  Diagnosis Date  . Bradycardia    cardiology-- dr Curt Bears--  work-up done included event monitor and echo (all in epic)  . Carotid artery stenosis    per pt H&P dated 10-20-2018 --  <40%  . Coronary atherosclerosis   . Glaucoma, right eye   . History of nuclear stress test 04/15/2005   (in epic)  for near syncope--- Low risk normal no ischemia, ef 57%  . Hx of adenomatous colonic polyps 05/27/2016  . Internal hemorrhoids   . Morton neuroma, left   . OA (osteoarthritis)    wrist, elbow, thumb, knees  . PVC's (premature ventricular contractions)   . RBBB (right bundle branch block)    States heart rate has been as low as 28 and asyptomatic    Past Surgical History:  Procedure Laterality Date  . COLONOSCOPY  05/2016   TA  . ELBOW SURGERY Left 04/2017  . EXCISION MORTON'S NEUROMA Left 10/23/2018   Procedure: EXCISION MORTON'S NEUROMA LEFT FOOT;  Surgeon: Rosemary Holms, DPM;  Location: [place];  Service: Podiatry;  Laterality: Left;  . HEMORRHOID BANDING  2018  . INGUINAL HERNIA REPAIR Left 06/14/2017   Procedure: LEFT INGUINAL HERNIA REPAIR ERAS PATHWAY;  Surgeon: Erroll Luna, MD;  Location: [place];  Service: General;  Laterality: Left;  . INGUINAL HERNIA REPAIR Right  11-18-2009   dr Redmond Pulling  _0   . INSERTION OF MESH Left 06/14/2017   Procedure: INSERTION OF MESH;  Surgeon: Erroll Luna, MD;  Location: [place];  Service: General;  Laterality: Left;  . KNEE ARTHROSCOPY W/ MENISCAL REPAIR Right 09/2015  . POLYPECTOMY    . TONSILLECTOMY AND ADENOIDECTOMY  child    There were no vitals filed for this visit.   Subjective Assessment - 08/01/20 1110    Subjective Dave relays shoulder still has little bit of pain/soreness but he feels ready to finish up with PT next week and transition to HEP.    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, golfing    Pain Onset More than a month ago           [facility] Adult PT Treatment/Exercise - 08/01/20 0001      Shoulder Exercises: Supine   Other Supine Exercises LLLD stretch into flexion with 2# 1 min hold x2      Shoulder Exercises: Prone   Other Prone Exercises Prone Y,T,I, with 1#  2 sets of 15      Shoulder Exercises: Standing   External Rotation Right    External Rotation Limitations 5# cable machine 15, 15, 15 with BFR 70  ABduction Right    ABduction Limitations 5# cable machine 15, 15, 15 with BFR 70    Other Standing Exercises Posterior capsule stretch 10X  10 seconds, with MET contract-relax,  IR stretch behind back with strap 10 sec X 10, BFR cable curl to OH press 15 lbs 20, 15, 15      Shoulder Exercises: ROM/Strengthening   UBE (Upper Arm Bike) L5, 3 min fwd, 3  min retro      Manual Therapy   Manual therapy comments GH mobs A-P, P-A, inferior grade 3-4, thoacic central PA mobs, scapular mobs                    PT Short Term Goals - 07/18/20 1703      PT SHORT TERM GOAL #1   Title Patient will be I with HEP    Baseline met    Time 2    Period Weeks    Status Achieved    Target Date 06/03/20      PT SHORT TERM GOAL #2   Title Patient will improve PROM to  at least 140 flexion, ER 80, 140 abduction for improved function    Time 4    Period Weeks    Status Achieved    Target Date 06/17/20             PT Long Term Goals - 07/29/20 1210      PT LONG TERM GOAL #1   Title Patient will demonstrate increased ROM to within functional use for returning to leisure activities    Baseline On track to meet goals in 12 weeks    Time 10    Period Weeks    Status On-going      PT LONG TERM GOAL #2   Title Improve shoulder strength to offset atrophy from surgery to be back to 5/5 in all motions for increased ability to lift/carry/use tools    Baseline now 4+ to 5-    Time 10    Period Weeks    Status On-going      PT LONG TERM GOAL #3   Title Patient will have increased ROM to be able to swing a golf club with proper body mechanics to play a round of golf    Baseline now can do    Time 10    Period Weeks    Status Achieved      PT LONG TERM GOAL #4   Title Patient will be able to complete overhead activities without pain over 2/10 on NPS or 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    Status Achieved                 Plan - 08/01/20 1123    Clinical Impression Statement He has one visit left on plan of care and he feels he will be ready to finish up then and transition to his HEP so we will finalize HEP to include home exericises and gym exercises.    Personal Factors and Comorbidities Age    Examination-Activity Limitations Reach Overhead;Lift;Carry    Examination-Participation Restrictions Cleaning;Community Activity;Volunteer    Stability/Clinical Decision Making  Stable/Uncomplicated    Rehab Potential Excellent    PT Frequency 2x / week    PT Duration Other (comment)   10 weeks   PT  Treatment/Interventions ADLs/Self Care Home Management;Functional mobility training;Therapeutic activities;Therapeutic exercise;Neuromuscular re-education;Manual techniques;Passive range of motion;Vasopneumatic Device;Cryotherapy;Electrical Stimulation;Iontophoresis 70m/ml Dexamethasone;Moist Heat;Ultrasound;Taping;Joint Manipulations;Dry needling    PT Next Visit Plan final HEP and DC    PT Home Exercise Plan Access Code: [email or web address]   Consulted and Agree with Plan of Care Patient           Patient will benefit from skilled therapeutic intervention in order to improve the following deficits and impairments:  Decreased range of motion,Impaired flexibility,Decreased mobility,Decreased strength,Impaired UE functional use  Visit Diagnosis: Muscle weakness (generalized)  Localized edema  Chronic left shoulder pain  Stiffness of left shoulder, not elsewhere classified  Acute pain of left shoulder     Problem List Patient Active Problem List   Diagnosis Date Noted  . Nontraumatic incomplete tear of left rotator cuff 03/13/2020  . Impingement syndrome of left shoulder 08/23/2019  . Unilateral primary osteoarthritis, right knee 11/01/2017  . Chest pain 08/07/2017  . Bradycardia 08/07/2017  . History of right tennis elbow 05/09/2017  . Right tennis elbow 04/07/2017  . Prolapsed internal hemorrhoids, grade 3 06/03/2016  . Hx of adenomatous colonic polyps 05/27/2016    BSilvestre Mesi4/11/2020, 11:29 AM  [facility] [address] [address]Phone: [phone]  Fax:  [phone] Name: Andrew SAVARESEMRN: [id number] of Birth: 707/12/1945

## 2020-08-05 ENCOUNTER — Other Ambulatory Visit: Payer: Self-pay

## 2020-08-05 ENCOUNTER — Ambulatory Visit: Payer: PPO | Admitting: Physical Therapy

## 2020-08-05 DIAGNOSIS — G8929 Other chronic pain: Secondary | ICD-10-CM

## 2020-08-05 DIAGNOSIS — R600 Localized edema: Secondary | ICD-10-CM

## 2020-08-05 DIAGNOSIS — M25512 Pain in left shoulder: Secondary | ICD-10-CM

## 2020-08-05 DIAGNOSIS — M6281 Muscle weakness (generalized): Secondary | ICD-10-CM | POA: Diagnosis not present

## 2020-08-05 DIAGNOSIS — M25612 Stiffness of left shoulder, not elsewhere classified: Secondary | ICD-10-CM | POA: Diagnosis not present

## 2020-08-05 NOTE — Therapy (Addendum)
[place], [place], [address] Phone: [phone]   Fax:  [phone]  Physical Therapy Treatment/ Discharge   Patient Details  Name: Andrew Payne MRN: [id number] Date of Birth: [birth year] Referring Provider (PT): Joni Fears, MD   Encounter Date: 08/05/2020   PT End of Session - 08/05/20 1120     Visit Number 22    Number of Visits 22    Date for PT Re-Evaluation 08/08/20    PT Start Time 1607    PT Stop Time 1100    PT Time Calculation (min) 45 min    Activity Tolerance Patient tolerated treatment well;No increased pain    Behavior During Therapy [facility] for tasks assessed/performed             Past Medical History:  Diagnosis Date   Bradycardia    cardiology-- dr Curt Bears--  work-up done included event monitor and echo (all in epic)   Carotid artery stenosis    per pt H&P dated 10-20-2018 --  <40%   Coronary atherosclerosis    Glaucoma, right eye    History of nuclear stress test 04/15/2005   (in epic)  for near syncope--- Low risk normal no ischemia, ef 57%   Hx of adenomatous colonic polyps 05/27/2016   Internal hemorrhoids    Morton neuroma, left    OA (osteoarthritis)    wrist, elbow, thumb, knees   PVC's (premature ventricular contractions)    RBBB (right bundle branch block)    States heart rate has been as low as 28 and asyptomatic    Past Surgical History:  Procedure Laterality Date   COLONOSCOPY  05/2016   TA   ELBOW SURGERY Left 04/2017   EXCISION MORTON'S NEUROMA Left 10/23/2018   Procedure: EXCISION MORTON'S NEUROMA LEFT FOOT;  Surgeon: Rosemary Holms, DPM;  Location: [place];  Service: Podiatry;  Laterality: Left;   HEMORRHOID BANDING  2018   INGUINAL HERNIA REPAIR Left 06/14/2017   Procedure: LEFT INGUINAL HERNIA REPAIR ERAS PATHWAY;  Surgeon: Erroll Luna, MD;  Location: [place];  Service: General;  Laterality: Left;   INGUINAL HERNIA REPAIR  Right 11-18-2009   dr Redmond Pulling  '@[facility]'    INSERTION OF MESH Left 06/14/2017   Procedure: INSERTION OF MESH;  Surgeon: Erroll Luna, MD;  Location: [place];  Service: General;  Laterality: Left;   KNEE ARTHROSCOPY W/ MENISCAL REPAIR Right 09/2015   POLYPECTOMY     TONSILLECTOMY AND ADENOIDECTOMY  child    There were no vitals filed for this visit.   Subjective Assessment - 08/05/20 1042     Subjective Andrew Payne relays shoulder still has little bit of pain/soreness but he feels ready to finish up with PT next week and transition to HEP.    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, golfing    Pain Onset More than a month ago                [facility] PT Assessment - 08/05/20 0001       Assessment   Medical Diagnosis L shoulder RTC repair    Referring Provider (PT) Joni Fears, MD    Onset Date/Surgical Date 05/08/20      Observation/Other Assessments   Focus on Therapeutic Outcomes (FOTO)  63%      AROM   Left Shoulder Flexion 150 Degrees    Left Shoulder ABduction --  WNL   Left Shoulder Internal Rotation 65 Degrees    Left Shoulder External Rotation --   WNL     Strength   Left Shoulder Flexion 5/5    Left Shoulder Extension 5/5    Left Shoulder ABduction 5/5    Left Shoulder Internal Rotation 5/5    Left Shoulder External Rotation 5/5                [facility] Adult PT Treatment/Exercise - 08/05/20 0001       Self-Care   Self-Care Other Self-Care Comments    Other Self-Care Comments  review of his gym program and HEP with recommendations, 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      Shoulder Exercises: Supine   Other Supine Exercises LLLD stretch into flexion with 2# 30 hold x3      Shoulder Exercises: Pulleys   Flexion 2 minutes    ABduction 2 minutes      Shoulder Exercises: Stretch   Cross Chest  Stretch 10 seconds;5 reps    Internal Rotation Stretch 10 seconds    Internal Rotation Stretch Limitations 10 reps with strap behind back      Manual Therapy   Manual therapy comments PROM Lt shoulder                      PT Short Term Goals - 07/18/20 1703       PT SHORT TERM GOAL #1   Title Patient will be I with HEP    Baseline met    Time 2    Period Weeks    Status Achieved    Target Date 06/03/20      PT SHORT TERM GOAL #2   Title Patient will improve PROM to at least 140 flexion, ER 80, 140 abduction for improved function    Time 4    Period Weeks    Status Achieved    Target Date 06/17/20               PT Long Term Goals - 08/05/20 1124       PT LONG TERM GOAL #1   Title Patient will demonstrate increased ROM to within functional use for returning to leisure activities    Baseline On track to meet goals in 12 weeks    Time 10    Period Weeks    Status Achieved      PT LONG TERM GOAL #2   Title Improve shoulder strength to offset atrophy from surgery to be back to 5/5 in all motions for increased ability to lift/carry/use tools    Baseline now 4+ to 5-    Time 10    Period Weeks    Status Achieved      PT LONG TERM GOAL #3   Title Patient will have increased ROM to be able to swing a golf club with proper body mechanics to play a round of golf    Baseline now can do    Time 10    Period Weeks    Status Achieved      PT LONG TERM GOAL #4   Title Patient will be able to complete overhead activities without pain over 2/10 on NPS or limitation for ability to return to volunteer work    Baseline still some pain up to a 4 at times.    Time 10    Period Weeks    Status Partially Met  PT LONG TERM GOAL #5   Title Andrew Payne will be independent with his long-term HEP at [place].    Status Achieved                   Plan - 08/05/20 1120     Clinical Impression Statement He will finish PT today as he is pleased with his funcitonal  level and ready to transition to independent rehab at home and at gym. 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 He showed good understanding of this and had no further questions or concerns. He does still lack minimal ROM in his left shoulder as well as still gets pinching/popping at times. He would like for [place] leave his episode open in the event he feels he needs to return. We will close out his episode if we do not hear back in 60 days.    Personal Factors and Comorbidities Age    Examination-Activity Limitations Reach Overhead;Lift;Carry    Examination-Participation Restrictions Cleaning;Community Activity;Volunteer    Stability/Clinical Decision Making Stable/Uncomplicated    Rehab Potential Excellent    PT Frequency 2x / week    PT Duration Other (comment)   10 weeks   PT Treatment/Interventions ADLs/Self Care Home Management;Functional mobility training;Therapeutic activities;Therapeutic exercise;Neuromuscular re-education;Manual techniques;Passive range of motion;Vasopneumatic Device;Cryotherapy;Electrical Stimulation;Iontophoresis 19m/ml Dexamethasone;Moist Heat;Ultrasound;Taping;Joint Manipulations;Dry needling    PT Next Visit Plan final HEP and DC    PT Home Exercise Plan Access Code: [email or web address]   Consulted and Agree with Plan of Care Patient             Patient will benefit from skilled therapeutic intervention in order to improve the following deficits and impairments:  Decreased range of motion,Impaired flexibility,Decreased mobility,Decreased strength,Impaired UE functional use  Visit Diagnosis: Muscle weakness (generalized)  Localized edema  Chronic left shoulder pain  Stiffness of left shoulder, not elsewhere classified  Acute pain of left shoulder     Problem List Patient Active Problem List   Diagnosis Date Noted   Nontraumatic incomplete tear  of left rotator cuff 03/13/2020   Impingement syndrome of left shoulder 08/23/2019   Unilateral primary osteoarthritis, right knee 11/01/2017   Chest pain 08/07/2017   Bradycardia 08/07/2017   History of right tennis elbow 05/09/2017   Right tennis elbow 04/07/2017   Prolapsed internal hemorrhoids, grade 3 06/03/2016   Hx of adenomatous colonic polyps 05/27/2016    BSilvestre Mesi4/03/2021, 11:25 AM  PHYSICAL THERAPY DISCHARGE SUMMARY  Visits from Start of Care: 22  Current functional level related to goals / functional outcomes: See note   Remaining deficits: See note   Education / Equipment: HEP   Patient agrees to discharge. Patient goals were partially met. Patient is being discharged due to not returning since the last visit.  MScot Jun PT, DPT, OCS, ATC 10/14/20  1:51 PM     [facility] Therapy [address] [place] [address]Phone: [phone]  Fax:  [phone] Name: Andrew RAKESTRAWMRN: [id number] of Birth: 712-13-1947

## 2020-08-06 DIAGNOSIS — R002 Palpitations: Secondary | ICD-10-CM | POA: Diagnosis not present

## 2020-08-06 DIAGNOSIS — I493 Ventricular premature depolarization: Secondary | ICD-10-CM | POA: Diagnosis not present

## 2020-08-07 ENCOUNTER — Encounter: Payer: PPO | Admitting: Physical Therapy

## 2020-08-14 ENCOUNTER — Encounter: Payer: Self-pay | Admitting: Orthopaedic Surgery

## 2020-08-14 ENCOUNTER — Ambulatory Visit: Payer: PPO | Admitting: Orthopaedic Surgery

## 2020-08-14 ENCOUNTER — Other Ambulatory Visit: Payer: Self-pay

## 2020-08-14 VITALS — Ht 74.0 in | Wt 185.0 lb

## 2020-08-14 DIAGNOSIS — M75112 Incomplete rotator cuff tear or rupture of left shoulder, not specified as traumatic: Secondary | ICD-10-CM | POA: Diagnosis not present

## 2020-08-14 NOTE — Progress Notes (Signed)
Office Visit Note   Patient: Andrew Payne           Date of Birth: [birth year]           MRN: [id number] Visit Date: 08/14/2020              Requested by: Ginger Organ., MD [address] [place],  [place] [address] PCP: Ginger Organ., MD   Assessment & Plan: Visit Diagnoses:  1. Nontraumatic incomplete tear of left rotator cuff     Plan: 3-1/2 months status post arthroscopic SCD DCR and mini open rotator cuff tear repair of left shoulder.  Actually doing reasonably well.  He is finished a course of therapy and has a home exercise program.  He is begun to play golf with putting and chipping.  He notes that he does not have that sharp pain that he had constantly before surgery but still having some discomfort and is not sure that overall he can tell a difference but he might be improving "slowly".  He is able to raise his arm over his head and had negative impingement.  Still little bit of tenderness in the subacromial region.  We discussed activity level and strengthening exercises.  Like to see him back in about a month.  We can always consider repeat MRI scan if he continues to have a problem.  I think one of the biggest issue is his weakness and hopefully that will improve as he works on his home exercise program  Follow-Up Instructions: Return in about 1 month (around 09/13/2020).   Orders:  No orders of the defined types were placed in this encounter.  No orders of the defined types were placed in this encounter.     Procedures: No procedures performed   Clinical Data: No additional findings.   Subjective: Chief Complaint  Patient presents with  . Left Shoulder - Follow-up    Left shoulder arthroscopy 05/08/2020  Patient presents today for follow up on his left shoulder. He had a left shoulder arthroscopy on 05/08/2020. Patient states that his shoulder is very slowly improving. He finished physical therapy last week.   HPI  Review of  Systems   Objective: Vital Signs: Ht 6\' 2"  (1.88 m)   Wt 185 lb (83.9 kg)   BMI 23.75 kg/m   Physical Exam Constitutional:      Appearance: He is well-developed.  Eyes:     Pupils: Pupils are equal, round, and reactive to light.  Pulmonary:     Effort: Pulmonary effort is normal.  Skin:    General: Skin is warm and dry.  Neurological:     Mental Status: He is alert and oriented to person, place, and time.  Psychiatric:        Behavior: Behavior normal.     Ortho Exam awake alert and oriented x3.  Comfortable sitting left shoulder with negative impingement.  Good strength.  Well-healed arthroscopic and mini open incisions.  Able to place his arm fully over his head with slow movement.  Abducts about 90 degrees.  Minimal discomfort in the area of the anterior subacromial region but no grating or crepitation.  Specialty Comments:  No specialty comments available.  Imaging: No results found.   PMFS History: Patient Active Problem List   Diagnosis Date Noted  . Nontraumatic incomplete tear of left rotator cuff 03/13/2020  . Impingement syndrome of left shoulder 08/23/2019  . Unilateral primary osteoarthritis, right knee 11/01/2017  . Chest pain 08/07/2017  .  Bradycardia 08/07/2017  . History of right tennis elbow 05/09/2017  . Right tennis elbow 04/07/2017  . Prolapsed internal hemorrhoids, grade 3 06/03/2016  . Hx of adenomatous colonic polyps 05/27/2016   Past Medical History:  Diagnosis Date  . Bradycardia    cardiology-- dr Curt Bears--  work-up done included event monitor and echo (all in epic)  . Carotid artery stenosis    per pt H&P dated 10-20-2018 --  <40%  . Coronary atherosclerosis   . Glaucoma, right eye   . History of nuclear stress test 04/15/2005   (in epic)  for near syncope--- Low risk normal no ischemia, ef 57%  . Hx of adenomatous colonic polyps 05/27/2016  . Internal hemorrhoids   . Morton neuroma, left   . OA (osteoarthritis)    wrist, elbow,  thumb, knees  . PVC's (premature ventricular contractions)   . RBBB (right bundle branch block)    States heart rate has been as low as 28 and asyptomatic    Family History  Problem Relation Age of Onset  . Heart disease Mother   . Alzheimer's disease Father   . Colon polyps Father   . Colon cancer Neg Hx   . Pancreatic cancer Neg Hx   . Prostate cancer Neg Hx   . Rectal cancer Neg Hx   . Esophageal cancer Neg Hx     Past Surgical History:  Procedure Laterality Date  . COLONOSCOPY  05/2016   TA  . ELBOW SURGERY Left 04/2017  . EXCISION MORTON'S NEUROMA Left 10/23/2018   Procedure: EXCISION MORTON'S NEUROMA LEFT FOOT;  Surgeon: Rosemary Holms, DPM;  Location: [place];  Service: Podiatry;  Laterality: Left;  . HEMORRHOID BANDING  2018  . INGUINAL HERNIA REPAIR Left 06/14/2017   Procedure: LEFT INGUINAL HERNIA REPAIR ERAS PATHWAY;  Surgeon: Erroll Luna, MD;  Location: [place];  Service: General;  Laterality: Left;  . INGUINAL HERNIA REPAIR Right 11-18-2009   dr Redmond Pulling  @[facility]   . INSERTION OF MESH Left 06/14/2017   Procedure: INSERTION OF MESH;  Surgeon: Erroll Luna, MD;  Location: [place];  Service: General;  Laterality: Left;  . KNEE ARTHROSCOPY W/ MENISCAL REPAIR Right 09/2015  . POLYPECTOMY    . TONSILLECTOMY AND ADENOIDECTOMY  child   Social History   Occupational History  . Not on file  Tobacco Use  . Smoking status: Never Smoker  . Smokeless tobacco: Never Used  Vaping Use  . Vaping Use: Never used  Substance and Sexual Activity  . Alcohol use: Not Currently  . Drug use: No  . Sexual activity: Not on file

## 2020-08-26 ENCOUNTER — Telehealth: Payer: Self-pay | Admitting: Cardiology

## 2020-08-26 DIAGNOSIS — R002 Palpitations: Secondary | ICD-10-CM

## 2020-08-26 DIAGNOSIS — I493 Ventricular premature depolarization: Secondary | ICD-10-CM

## 2020-08-26 DIAGNOSIS — R0609 Other forms of dyspnea: Secondary | ICD-10-CM

## 2020-08-26 DIAGNOSIS — R0600 Dyspnea, unspecified: Secondary | ICD-10-CM

## 2020-08-26 NOTE — Telephone Encounter (Signed)
Patient call back to do a f/u with Sherri from a conversation they had a couple of weeks ago. please advise

## 2020-08-27 NOTE — Telephone Encounter (Signed)
Pt returned my call. Aware Dr. Curt Bears is agreeable to GXT testing to see if we can capture the episodes that occur w/ walking. Pt aware office will call to arrange. Pt agreeable to plan.

## 2020-08-27 NOTE — Telephone Encounter (Signed)
Patient returning call.

## 2020-08-27 NOTE — Telephone Encounter (Signed)
Left message to call back  

## 2020-09-19 NOTE — Addendum Note (Signed)
Addended by: Stanton Kidney on: 09/19/2020 10:38 AM   Modules accepted: Orders

## 2020-09-19 NOTE — Addendum Note (Signed)
Addended by: Trinidad Curet L on: 09/19/2020 08:00 AM   Modules accepted: Orders

## 2020-09-23 NOTE — Addendum Note (Signed)
Addended by: Stanton Kidney on: 09/23/2020 02:07 PM   Modules accepted: Orders

## 2020-09-23 NOTE — Addendum Note (Signed)
Addended by: Allegra Lai M on: 09/23/2020 02:10 PM   Modules accepted: Orders

## 2020-09-24 DIAGNOSIS — Z125 Encounter for screening for malignant neoplasm of prostate: Secondary | ICD-10-CM | POA: Diagnosis not present

## 2020-09-24 DIAGNOSIS — E785 Hyperlipidemia, unspecified: Secondary | ICD-10-CM | POA: Diagnosis not present

## 2020-09-24 DIAGNOSIS — Z0000 Encounter for general adult medical examination without abnormal findings: Secondary | ICD-10-CM | POA: Diagnosis not present

## 2020-09-30 ENCOUNTER — Ambulatory Visit (INDEPENDENT_AMBULATORY_CARE_PROVIDER_SITE_OTHER): Payer: PPO

## 2020-09-30 ENCOUNTER — Other Ambulatory Visit: Payer: Self-pay

## 2020-09-30 DIAGNOSIS — R002 Palpitations: Secondary | ICD-10-CM | POA: Diagnosis not present

## 2020-09-30 DIAGNOSIS — I493 Ventricular premature depolarization: Secondary | ICD-10-CM

## 2020-09-30 DIAGNOSIS — R0600 Dyspnea, unspecified: Secondary | ICD-10-CM | POA: Diagnosis not present

## 2020-09-30 DIAGNOSIS — R0609 Other forms of dyspnea: Secondary | ICD-10-CM

## 2020-09-30 LAB — EXERCISE TOLERANCE TEST
Estimated workload: 10.1 METS
Exercise duration (min): 9 min
Exercise duration (sec): 0 s
MPHR: 148 {beats}/min
Peak HR: 139 {beats}/min
Percent HR: 93 %
RPE: 14
Rest HR: 48 {beats}/min

## 2020-10-01 ENCOUNTER — Ambulatory Visit: Payer: PPO | Admitting: Orthopaedic Surgery

## 2020-10-01 ENCOUNTER — Encounter: Payer: Self-pay | Admitting: Orthopaedic Surgery

## 2020-10-01 VITALS — Ht 74.0 in | Wt 183.0 lb

## 2020-10-01 DIAGNOSIS — I2510 Atherosclerotic heart disease of native coronary artery without angina pectoris: Secondary | ICD-10-CM | POA: Diagnosis not present

## 2020-10-01 DIAGNOSIS — R002 Palpitations: Secondary | ICD-10-CM | POA: Diagnosis not present

## 2020-10-01 DIAGNOSIS — M75112 Incomplete rotator cuff tear or rupture of left shoulder, not specified as traumatic: Secondary | ICD-10-CM

## 2020-10-01 DIAGNOSIS — I495 Sick sinus syndrome: Secondary | ICD-10-CM | POA: Diagnosis not present

## 2020-10-01 DIAGNOSIS — E785 Hyperlipidemia, unspecified: Secondary | ICD-10-CM | POA: Diagnosis not present

## 2020-10-01 DIAGNOSIS — Z1331 Encounter for screening for depression: Secondary | ICD-10-CM | POA: Diagnosis not present

## 2020-10-01 DIAGNOSIS — R82998 Other abnormal findings in urine: Secondary | ICD-10-CM | POA: Diagnosis not present

## 2020-10-01 DIAGNOSIS — Z0000 Encounter for general adult medical examination without abnormal findings: Secondary | ICD-10-CM | POA: Diagnosis not present

## 2020-10-01 DIAGNOSIS — I6529 Occlusion and stenosis of unspecified carotid artery: Secondary | ICD-10-CM | POA: Diagnosis not present

## 2020-10-01 DIAGNOSIS — M179 Osteoarthritis of knee, unspecified: Secondary | ICD-10-CM | POA: Diagnosis not present

## 2020-10-01 DIAGNOSIS — Z1339 Encounter for screening examination for other mental health and behavioral disorders: Secondary | ICD-10-CM | POA: Diagnosis not present

## 2020-10-01 DIAGNOSIS — M79672 Pain in left foot: Secondary | ICD-10-CM | POA: Diagnosis not present

## 2020-10-01 DIAGNOSIS — R001 Bradycardia, unspecified: Secondary | ICD-10-CM | POA: Diagnosis not present

## 2020-10-01 DIAGNOSIS — I493 Ventricular premature depolarization: Secondary | ICD-10-CM | POA: Diagnosis not present

## 2020-10-01 NOTE — Progress Notes (Signed)
Office Visit Note   Patient: Andrew Payne           Date of Birth: 27-Nov-1945           MRN: [id number] Visit Date: 10/01/2020              Requested by: Ginger Organ., MD [address] [place],  [place] [address] PCP: Ginger Organ., MD   Assessment & Plan: Visit Diagnoses:  1. Nontraumatic incomplete tear of left rotator cuff     Plan: [name] is about 6 months status post left shoulder arthroscopic SCD and DCR and then a mini open rotator cuff tear repair.  He had a central area in the supraspinatus tendon with a small tear which was elliptically excised and then sewn together.  His course has been slow but progressive.  He does not have any limitation of activities and does not feel he had the sharp pain that he had preoperatively.  He still has a little trouble sleeping on that side but overall much improved.  He continues to be involved in strengthening exercises.  I will plan to see him back as needed.  Am very happy that he is done as well as he has and is working hard to increase his strength Follow-Up Instructions: Return if symptoms worsen or fail to improve.   Orders:  No orders of the defined types were placed in this encounter.  No orders of the defined types were placed in this encounter.     Procedures: No procedures performed   Clinical Data: No additional findings.   Subjective: Chief Complaint  Patient presents with  . Left Shoulder - Follow-up     Left shoulder arthroscopy 05/08/2020    Patient returns for six week follow up. He is status post left shoulder arthroscopy on 04/28/2020. He states that he is getting better, it is just very slow. He continues to have some pain but is not taking anything for it. He is doing home exercises daily.  HPI  Review of Systems   Objective: Vital Signs: Ht 6\' 2"  (1.88 m)   Wt 183 lb (83 kg)   BMI 23.50 kg/m   Physical Exam Constitutional:      Appearance: He is well-developed.  Eyes:     Pupils:  Pupils are equal, round, and reactive to light.  Pulmonary:     Effort: Pulmonary effort is normal.  Skin:    General: Skin is warm and dry.  Neurological:     Mental Status: He is alert and oriented to person, place, and time.  Psychiatric:        Behavior: Behavior normal.     Ortho Exam awake alert and oriented x3.  Comfortable sitting.  Able to quickly place his left arm over his head.  Has just a little bit of loss of external rotation.  Able to touch the middle of his back and has good strength.  No crepitation.  Just minimal pain on the extreme of external rotation with the impingement position.  Specialty Comments:  No specialty comments available.  Imaging: No results found.   PMFS History: Patient Active Problem List   Diagnosis Date Noted  . Nontraumatic incomplete tear of left rotator cuff 03/13/2020  . Unilateral primary osteoarthritis, right knee 11/01/2017  . Chest pain 08/07/2017  . Bradycardia 08/07/2017  . History of right tennis elbow 05/09/2017  . Right tennis elbow 04/07/2017  . Prolapsed internal hemorrhoids, grade 3 06/03/2016  .  Hx of adenomatous colonic polyps 05/27/2016   Past Medical History:  Diagnosis Date  . Bradycardia    cardiology-- dr Curt Bears--  work-up done included event monitor and echo (all in epic)  . Carotid artery stenosis    per pt H&P dated 10-20-2018 --  <40%  . Coronary atherosclerosis   . Glaucoma, right eye   . History of nuclear stress test 04/15/2005   (in epic)  for near syncope--- Low risk normal no ischemia, ef 57%  . Hx of adenomatous colonic polyps 05/27/2016  . Internal hemorrhoids   . Morton neuroma, left   . OA (osteoarthritis)    wrist, elbow, thumb, knees  . PVC's (premature ventricular contractions)   . RBBB (right bundle branch block)    States heart rate has been as low as 28 and asyptomatic    Family History  Problem Relation Age of Onset  . Heart disease Mother   . Alzheimer's disease Father   .  Colon polyps Father   . Colon cancer Neg Hx   . Pancreatic cancer Neg Hx   . Prostate cancer Neg Hx   . Rectal cancer Neg Hx   . Esophageal cancer Neg Hx     Past Surgical History:  Procedure Laterality Date  . COLONOSCOPY  05/2016   TA  . ELBOW SURGERY Left 04/2017  . EXCISION MORTON'S NEUROMA Left 10/23/2018   Procedure: EXCISION MORTON'S NEUROMA LEFT FOOT;  Surgeon: Rosemary Holms, DPM;  Location: [place];  Service: Podiatry;  Laterality: Left;  . HEMORRHOID BANDING  2018  . INGUINAL HERNIA REPAIR Left 06/14/2017   Procedure: LEFT INGUINAL HERNIA REPAIR ERAS PATHWAY;  Surgeon: Erroll Luna, MD;  Location: [place];  Service: General;  Laterality: Left;  . INGUINAL HERNIA REPAIR Right 11-18-2009   dr Redmond Pulling  @[facility]   . INSERTION OF MESH Left 06/14/2017   Procedure: INSERTION OF MESH;  Surgeon: Erroll Luna, MD;  Location: [place];  Service: General;  Laterality: Left;  . KNEE ARTHROSCOPY W/ MENISCAL REPAIR Right 09/2015  . POLYPECTOMY    . TONSILLECTOMY AND ADENOIDECTOMY  child   Social History   Occupational History  . Not on file  Tobacco Use  . Smoking status: Never Smoker  . Smokeless tobacco: Never Used  Vaping Use  . Vaping Use: Never used  Substance and Sexual Activity  . Alcohol use: Not Currently  . Drug use: No  . Sexual activity: Not on file

## 2020-10-02 DIAGNOSIS — Z1212 Encounter for screening for malignant neoplasm of rectum: Secondary | ICD-10-CM | POA: Diagnosis not present

## 2020-10-07 ENCOUNTER — Telehealth: Payer: Self-pay | Admitting: Cardiology

## 2020-10-07 ENCOUNTER — Encounter: Payer: Self-pay | Admitting: *Deleted

## 2020-10-07 NOTE — Telephone Encounter (Signed)
    Pt calling to get ETT result

## 2020-10-07 NOTE — Telephone Encounter (Signed)
See result note for further documentation.

## 2020-10-29 NOTE — Progress Notes (Signed)
PCP:  Ginger Organ., MD Primary Cardiologist: None Electrophysiologist: [name], MD   Andrew Payne is a 75 y.o. male seen today for [name], MD for routine electrophysiology followup.  Since last being seen in our clinic the patient reports doing well overall.  Since after his exercise test he actually feels like he has had less "spells" of sudden lightheadedness and "washed out" feeling during exercise. He clarifies he did NOT feel this way during his ETT.  He denies chest pain, dyspnea, PND, orthopnea, nausea, vomiting, syncope, edema, weight gain, or early satiety.  Past Medical History:  Diagnosis Date   Bradycardia    cardiology-- dr Curt Bears--  work-up done included event monitor and echo (all in epic)   Carotid artery stenosis    per pt H&P dated 10-20-2018 --  <40%   Coronary atherosclerosis    Glaucoma, right eye    History of nuclear stress test 04/15/2005   (in epic)  for near syncope--- Low risk normal no ischemia, ef 57%   Hx of adenomatous colonic polyps 05/27/2016   Internal hemorrhoids    Morton neuroma, left    OA (osteoarthritis)    wrist, elbow, thumb, knees   PVC's (premature ventricular contractions)    RBBB (right bundle branch block)    States heart rate has been as low as 28 and asyptomatic   Past Surgical History:  Procedure Laterality Date   COLONOSCOPY  05/2016   TA   ELBOW SURGERY Left 04/2017   EXCISION MORTON'S NEUROMA Left 10/23/2018   Procedure: EXCISION MORTON'S NEUROMA LEFT FOOT;  Surgeon: Rosemary Holms, DPM;  Location: [facility];  Service: Podiatry;  Laterality: Left;   HEMORRHOID BANDING  2018   INGUINAL HERNIA REPAIR Left 06/14/2017   Procedure: LEFT INGUINAL HERNIA REPAIR ERAS PATHWAY;  Surgeon: Erroll Luna, MD;  Location: [facility];  Service: General;  Laterality: Left;   INGUINAL HERNIA REPAIR Right 11-18-2009   dr Redmond Pulling  @[facility]    INSERTION OF MESH Left 06/14/2017    Procedure: INSERTION OF MESH;  Surgeon: Erroll Luna, MD;  Location: [facility];  Service: General;  Laterality: Left;   KNEE ARTHROSCOPY W/ MENISCAL REPAIR Right 09/2015   POLYPECTOMY     TONSILLECTOMY AND ADENOIDECTOMY  child    Current Outpatient Medications  Medication Sig Dispense Refill   Cholecalciferol (VITAMIN D3) 1000 units CAPS Take 1 capsule by mouth daily.     dorzolamide (TRUSOPT) 2 % ophthalmic solution Place 1 drop into the right eye daily.      latanoprost (XALATAN) 0.005 % ophthalmic solution Place 1 drop into the right eye at bedtime.      Omega-3 Fatty Acids (FISH OIL) 1000 MG CAPS Take 1 capsule by mouth 2 (two) times daily.     No current facility-administered medications for this visit.    Allergies  Allergen Reactions   Oxycodone Nausea And Vomiting    Social History   Socioeconomic History   Marital status: Married    Spouse name: Not on file   Number of children: Not on file   Years of education: Not on file   Highest education level: Not on file  Occupational History   Not on file  Tobacco Use   Smoking status: Never   Smokeless tobacco: Never  Vaping Use   Vaping Use: Never used  Substance and Sexual Activity   Alcohol use: Not Currently   Drug use: No  Sexual activity: Not on file  Other Topics Concern   Not on file  Social History Narrative   He is married and retired has a second home in the [place] area \   Does drink alcohol no smoking no tobacco no drug use   Social Determinants of Radio broadcast assistant Strain: Not on file  Food Insecurity: Not on file  Transportation Needs: Not on file  Physical Activity: Not on file  Stress: Not on file  Social Connections: Not on file  Intimate Partner Violence: Not on file     Review of Systems: General: No chills, fever, night sweats or weight changes  Cardiovascular:  No chest pain, dyspnea on exertion, edema, orthopnea, palpitations, paroxysmal nocturnal  dyspnea Dermatological: No rash, lesions or masses Respiratory: No cough, dyspnea Urologic: No hematuria, dysuria Abdominal: No nausea, vomiting, diarrhea, bright red blood per rectum, melena, or hematemesis Neurologic: No visual changes, weakness, changes in mental status All other systems reviewed and are otherwise negative except as noted above.  Physical Exam: There were no vitals filed for this visit.  GEN- The patient is well appearing, alert and oriented x 3 today.   HEENT: normocephalic, atraumatic; sclera clear, conjunctiva pink; hearing intact; oropharynx clear; neck supple, no JVP Lymph- no cervical lymphadenopathy Lungs- Clear to ausculation bilaterally, normal work of breathing.  No wheezes, rales, rhonchi Heart- Regular rate and rhythm, no murmurs, rubs or gallops, PMI not laterally displaced GI- soft, non-tender, non-distended, bowel sounds present, no hepatosplenomegaly Extremities- no clubbing, cyanosis, or edema; DP/PT/radial pulses 2+ bilaterally MS- no significant deformity or atrophy Skin- warm and dry, no rash or lesion Psych- euthymic mood, full affect Neuro- strength and sensation are intact  EKG is ordered. Shows sinus bradycardia at 45 bpm with RBBB at 138 ms.  Kardia reading done in office showed sinus brady 40-50s  Additional studies reviewed include: Coronary CTA 08/09/2017 Review of the above records today demonstrates: 1. Left Main:  No significant stenosis.   2. LAD: No significant stenosis. 3. LCX: No significant stenosis. 4. RCA: No significant stenosis.   IMPRESSION: 1.  CT FFR analysis didn't show any significant stenosis.   Cardiac monitor 08/03/2018 personally reviewed Max 171 bpm 04:40pm, 04/03 Min 31 bpm 05:14am, 04/04 Avg 50 bpm Bradycardia occurred during likely sleeping hours. Multiple episodes of the tachycardia that appeared to be due to SVT.  Longest episode 9.2 seconds. 0 atrial fibrillation noted.   TTE 09/01/2018  1. The left  ventricle has normal systolic function, with an ejection  fraction of 55-60%. The cavity size was normal. Left ventricular diastolic  parameters were normal. No evidence of left ventricular regional wall  motion abnormalities.   2. The right ventricle has normal systolic function. The cavity was  normal. There is no increase in right ventricular wall thickness.   3. Left atrial size was mildly dilated.   4. Mild thickening of the mitral valve leaflet. There is mild mitral  annular calcification present.   5. The aortic valve is tricuspid. Mild thickening of the aortic valve.  Mild calcification of the aortic valve. Mild aortic annular calcification  noted.   6. The aortic root is normal in size and structure.    ETT 09/30/20 Blood pressure demonstrated a normal response to exercise. No T wave inversion was noted during stress. There was no ST segment deviation noted during stress.   Negative for ST changes suggestive of ischemia. However, PVCs increased in frequency during exercise. Ventricular bigeminy seen  in recovery as well.   Assessment and Plan:  1. SSS Difficult to tell what sinus rates are with frequent PVCs.  Having episodes of severe dizziness when walking.  Follow closely with AAD  2. Frequent PVCs 4.3% on recent monitor. As often as bigeminy on monitor and EKG ETT as above with increased frequency during exercise.  Discussed options at length including watchful waiting vs very careful trial of AAD given his RBBB and bradycardia at baseline.  In shared decision making patient will get a Kardia/Alive Cor and try to get a better picture of these episodes.  He mentions that at times he feels like he may have a "pause" when checking his carotid pulse during these episodes. He understands that significant brady or pause identified may warrant pacing.   He would prefer to have a better idea of exactly what's going on during these episodes prior to possibly "masking" the events with  an AAD.  If we start flecainide, will use lowest dose and need very close EKG follow up given his baseline conduction disease.   RTC 2-3 months, ideally with a picture of his HRs during these episodes for further clarification. Can move out further if he continues to fell better. I also walked him through sending a PDF of his Kardia events through [place].  Shirley Friar, [name]  10/29/20 2:02 PM

## 2020-10-30 ENCOUNTER — Ambulatory Visit: Payer: PPO | Admitting: Student

## 2020-10-30 ENCOUNTER — Encounter: Payer: Self-pay | Admitting: Student

## 2020-10-30 ENCOUNTER — Other Ambulatory Visit: Payer: Self-pay

## 2020-10-30 VITALS — BP 92/60 | HR 51 | Ht 74.0 in | Wt 180.0 lb

## 2020-10-30 DIAGNOSIS — R001 Bradycardia, unspecified: Secondary | ICD-10-CM

## 2020-10-30 DIAGNOSIS — I495 Sick sinus syndrome: Secondary | ICD-10-CM | POA: Diagnosis not present

## 2020-10-30 DIAGNOSIS — I493 Ventricular premature depolarization: Secondary | ICD-10-CM | POA: Diagnosis not present

## 2020-10-30 NOTE — Patient Instructions (Signed)
Medication Instructions:  Your physician recommends that you continue on your current medications as directed. Please refer to the Current Medication list given to you today.  *If you need a refill on your cardiac medications before your next appointment, please call your pharmacy*   Lab Work: None If you have labs (blood work) drawn today and your tests are completely normal, you will receive your results only by: [place] (if you have MyChart) OR A paper copy in the mail If you have any lab test that is abnormal or we need to change your treatment, we will call you to review the results.   Follow-Up: At [facility], you and your health needs are our priority.  As part of our continuing mission to provide you with exceptional heart care, we have created designated Provider Care Teams.  These Care Teams include your primary Cardiologist (physician) and Advanced Practice Providers (APPs -  Physician Assistants and Nurse Practitioners) who all work together to provide you with the care you need, when you need it.   Your next appointment:   As scheduled  Other Instructions AliveCor  FDA-cleared EKG at your fingertips. - [employer]   Agricultural engineer, [employer]. [email or web address]   FDA-cleared, clinical grade mobile EKG monitor: Jodelle Red is the most clinically-validated mobile EKG used by the world's leading cardiac care medical professionals.  This may be useful in monitoring palpitations.  We do not have access to have them emailed and reviewed but will be glad to review while in the office.

## 2020-11-05 DIAGNOSIS — H52221 Regular astigmatism, right eye: Secondary | ICD-10-CM | POA: Diagnosis not present

## 2020-11-05 DIAGNOSIS — H5212 Myopia, left eye: Secondary | ICD-10-CM | POA: Diagnosis not present

## 2020-11-05 DIAGNOSIS — H524 Presbyopia: Secondary | ICD-10-CM | POA: Diagnosis not present

## 2020-11-05 DIAGNOSIS — H401121 Primary open-angle glaucoma, left eye, mild stage: Secondary | ICD-10-CM | POA: Diagnosis not present

## 2020-11-05 DIAGNOSIS — H401111 Primary open-angle glaucoma, right eye, mild stage: Secondary | ICD-10-CM | POA: Diagnosis not present

## 2020-11-07 ENCOUNTER — Telehealth: Payer: Self-pay | Admitting: Cardiology

## 2020-11-07 NOTE — Telephone Encounter (Signed)
Spoke to pt briefly.  Agreed to speak Monday further about this. Pt agreeable to plan

## 2020-11-07 NOTE — Telephone Encounter (Signed)
Left message to call back  

## 2020-11-07 NOTE — Telephone Encounter (Signed)
Bobbi is calling requesting to speak with Sherri in regards to the patient message he received from her stating she would call him this week. Please advise.

## 2020-11-11 MED ORDER — FLECAINIDE ACETATE 50 MG PO TABS: 50.0000 mg | ORAL_TABLET | Freq: Two times a day (BID) | ORAL | 1 refills | Status: DC

## 2020-11-11 NOTE — Telephone Encounter (Signed)
Advised pt of recommendation. Pt going to start the Flecainide 50 mg BID this Saturday. Scheduled for f/u EKG on 8/2 with RN.  Pt reports resting HRs 38-42, walking/up hill 70-90s. He uses a fit bit & kardia mobile. Pt aware I will re-review if Toprol 25 mg should be starting w/ reports HRs. Aware I will let him know once Dr. Curt Bears advises. Patient verbalized understanding and agreeable to plan.

## 2020-11-12 NOTE — Telephone Encounter (Signed)
Left detailed message informing pt that we would NOT start Metoprolol at this time, will monitor. Start the Flecainide and let us know if issues continue after starting the medication.

## 2020-11-25 ENCOUNTER — Other Ambulatory Visit: Payer: Self-pay

## 2020-11-25 ENCOUNTER — Ambulatory Visit (INDEPENDENT_AMBULATORY_CARE_PROVIDER_SITE_OTHER): Payer: PPO | Admitting: *Deleted

## 2020-11-25 VITALS — HR 47 | Ht 74.0 in | Wt 184.0 lb

## 2020-11-25 DIAGNOSIS — I493 Ventricular premature depolarization: Secondary | ICD-10-CM | POA: Diagnosis not present

## 2020-11-25 NOTE — Progress Notes (Signed)
Reason for visit: Flecainide 50 mg tablet, BID  Name of MD requesting visit: Dr. Curt Bears  H&P: 2. Frequent PVCs 4.3% on recent monitor. As often as bigeminy on monitor and EKG ETT as above with increased frequency during exercise.  Discussed options at length including watchful waiting vs very careful trial of AAD given his RBBB and bradycardia at baseline. In shared decision making patient will get a Kardia/Alive Cor and try to get a better picture of these episodes.  He mentions that at times he feels like he may have a "pause" when checking his carotid pulse during these episodes. He understands that significant brady or pause identified may warrant pacing.   He would prefer to have a better idea of exactly what's going on during these episodes prior to possibly "masking" the events with an AAD. If we start flecainide, will use lowest dose and need very close EKG follow up given his baseline conduction disease.  ROS related to problem: patient is feeling much better after starting the medication. The patient walks ~90 minutes daily. This is when his PVC's were most bothersome. He has not had any complains during his walks since the medication start.  Assessment and plan per MD: Continue with current medication. Follow up as planned.

## 2020-11-25 NOTE — Addendum Note (Signed)
Addended by: Darrell Jewel on: 11/25/2020 02:44 PM   Modules accepted: Orders

## 2020-11-25 NOTE — Patient Instructions (Addendum)
Medication Instructions:  Your physician recommends that you continue on your current medications as directed. Please refer to the Current Medication list given to you today.  Labwork: None ordered.  Testing/Procedures: None ordered.  Follow-Up: Your physician wants you to follow-up in: follow up as schedule with Dr. Curt Bears.    Any Other Special Instructions Will Be Listed Below (If Applicable).  If you need a refill on your cardiac medications before your next appointment, please call your pharmacy.

## 2020-12-15 DIAGNOSIS — L853 Xerosis cutis: Secondary | ICD-10-CM | POA: Diagnosis not present

## 2020-12-15 DIAGNOSIS — L821 Other seborrheic keratosis: Secondary | ICD-10-CM | POA: Diagnosis not present

## 2020-12-15 DIAGNOSIS — D224 Melanocytic nevi of scalp and neck: Secondary | ICD-10-CM | POA: Diagnosis not present

## 2020-12-15 DIAGNOSIS — L570 Actinic keratosis: Secondary | ICD-10-CM | POA: Diagnosis not present

## 2020-12-15 DIAGNOSIS — D2261 Melanocytic nevi of right upper limb, including shoulder: Secondary | ICD-10-CM | POA: Diagnosis not present

## 2021-01-06 DIAGNOSIS — M7742 Metatarsalgia, left foot: Secondary | ICD-10-CM | POA: Diagnosis not present

## 2021-01-06 DIAGNOSIS — M79672 Pain in left foot: Secondary | ICD-10-CM | POA: Diagnosis not present

## 2021-01-06 DIAGNOSIS — M19072 Primary osteoarthritis, left ankle and foot: Secondary | ICD-10-CM | POA: Diagnosis not present

## 2021-01-06 DIAGNOSIS — Z885 Allergy status to narcotic agent status: Secondary | ICD-10-CM | POA: Diagnosis not present

## 2021-01-13 ENCOUNTER — Encounter: Payer: Self-pay | Admitting: Cardiology

## 2021-01-13 ENCOUNTER — Ambulatory Visit: Payer: PPO | Admitting: Cardiology

## 2021-01-13 ENCOUNTER — Other Ambulatory Visit: Payer: Self-pay

## 2021-01-13 VITALS — BP 100/64 | HR 43 | Ht 74.0 in | Wt 184.6 lb

## 2021-01-13 DIAGNOSIS — I493 Ventricular premature depolarization: Secondary | ICD-10-CM

## 2021-01-13 NOTE — Patient Instructions (Addendum)
Medication Instructions:  Your physician recommends that you continue on your current medications as directed. Please refer to the Current Medication list given to you today.  *If you need a refill on your cardiac medications before your next appointment, please call your pharmacy*   Lab Work: None ordered   Testing/Procedures: None ordered   Follow-Up: At [facility], you and your health needs are our priority.  As part of our continuing mission to provide you with exceptional heart care, we have created designated Provider Care Teams.  These Care Teams include your primary Cardiologist (physician) and Advanced Practice Providers (APPs -  Physician Assistants and Nurse Practitioners) who all work together to provide you with the care you need, when you need it.   Your next appointment:   6 month(s)  The format for your next appointment:   In Person  Provider:   Dr. Curt Bears    Thank you for choosing [facility]!!   Trinidad Curet, RN [phone]   Other Instructions

## 2021-01-13 NOTE — Progress Notes (Signed)
Electrophysiology Office Note   Date:  01/13/2021   ID:  Andrew Payne, DOB 1946-03-15, MRN [id number]  PCP:  Andrew Payne., MD  Cardiologist:   Primary Electrophysiologist:  Andrew Jewkes Meredith Leeds, MD    No chief complaint on file.    History of Present Illness: Andrew Payne is a 75 y.o. male who is being seen today for the evaluation of bradycardia at the request of Ladell Pier. Presenting today for electrophysiology evaluation.    He has a history significant for sick sinus syndrome, mild carotid stenosis, and PVCs.  He had a planned foot surgery 2 years prior where an ECG showed sinus rhythm in the 40s.  He has a right bundle branch block.  He was also noted to have heart rates in the 30s with junctional escape.  He went to the emergency room and was no longer having P waves on their 3-lead ECG.  Twelve-lead ECG also showed junctional rhythm with frequent PVCs.  At that time he was able do all his daily activities and his pacemaker was implanted.  He wore a cardiac monitor that showed a 4.3% PVC burden.  He is now on flecainide.  Today, denies symptoms of palpitations, chest pain, shortness of breath, orthopnea, PND, lower extremity edema, claudication, dizziness, presyncope, syncope, bleeding, or neurologic sequela. The patient is tolerating medications without difficulties.  He continues to do well.  His resting bradycardia has continued, though he is able to exert himself and do all his daily activities.  He states that he was doing quite well after starting flecainide, though approximately 3 weeks ago, he had more episodes.  This mainly occurs when he exerts exerting himself.  He has a cardia mobile app that his confirmed PVCs, but he does not have a cell phone with him.  He Andrew Payne send [place] recordings through [place].   Past Medical History:  Diagnosis Date   Bradycardia    cardiology-- dr Curt Bears--  work-up done included event monitor and echo (all in epic)   Carotid artery  stenosis    per pt H&P dated 10-20-2018 --  <40%   Coronary atherosclerosis    Glaucoma, right eye    History of nuclear stress test 04/15/2005   (in epic)  for near syncope--- Low risk normal no ischemia, ef 57%   Hx of adenomatous colonic polyps 05/27/2016   Internal hemorrhoids    Morton neuroma, left    OA (osteoarthritis)    wrist, elbow, thumb, knees   PVC's (premature ventricular contractions)    RBBB (right bundle branch block)    States heart rate has been as low as 28 and asyptomatic   Past Surgical History:  Procedure Laterality Date   COLONOSCOPY  05/2016   TA   ELBOW SURGERY Left 04/2017   EXCISION MORTON'S NEUROMA Left 10/23/2018   Procedure: EXCISION MORTON'S NEUROMA LEFT FOOT;  Surgeon: Rosemary Holms, DPM;  Location: [place];  Service: Podiatry;  Laterality: Left;   HEMORRHOID BANDING  2018   INGUINAL HERNIA REPAIR Left 06/14/2017   Procedure: LEFT INGUINAL HERNIA REPAIR ERAS PATHWAY;  Surgeon: Erroll Luna, MD;  Location: [place];  Service: General;  Laterality: Left;   INGUINAL HERNIA REPAIR Right 11-18-2009   dr Redmond Pulling  @[facility]    INSERTION OF MESH Left 06/14/2017   Procedure: INSERTION OF MESH;  Surgeon: Erroll Luna, MD;  Location: [place];  Service: General;  Laterality: Left;   KNEE ARTHROSCOPY W/ MENISCAL  REPAIR Right 09/2015   POLYPECTOMY     TONSILLECTOMY AND ADENOIDECTOMY  child     Current Outpatient Medications  Medication Sig Dispense Refill   Cholecalciferol (VITAMIN D3) 1000 units CAPS Take 1 capsule by mouth daily.     dorzolamide (TRUSOPT) 2 % ophthalmic solution Place 1 drop into the right eye daily.      flecainide (TAMBOCOR) 50 MG tablet Take 1 tablet (50 mg total) by mouth 2 (two) times daily. 180 tablet 1   latanoprost (XALATAN) 0.005 % ophthalmic solution Place 1 drop into the right eye at bedtime.      Omega-3 Fatty Acids (FISH OIL) 1000 MG CAPS Take 1 capsule by mouth 2 (two) times  daily.     No current facility-administered medications for this visit.    Allergies:   Oxycodone   Social History:  The patient  reports that he has never smoked. He has never used smokeless tobacco. He reports that he does not currently use alcohol. He reports that he does not use drugs.   Family History:  The patient's family history includes Alzheimer's disease in his father; Colon polyps in his father; Heart disease in his mother.   ROS:  Please see the history of present illness.   Otherwise, review of systems is positive for none.   All other systems are reviewed and negative.   PHYSICAL EXAM: VS:  BP 100/64   Pulse (!) 43   Ht 6\' 2"  (1.88 m)   Wt 184 lb 9.6 oz (83.7 kg)   SpO2 99%   BMI 23.70 kg/m  , BMI Body mass index is 23.7 kg/m. GEN: Well nourished, well developed, in no acute distress  HEENT: normal  Neck: no JVD, carotid bruits, or masses Cardiac: RRR; no murmurs, rubs, or gallops,no edema  Respiratory:  clear to auscultation bilaterally, normal work of breathing GI: soft, nontender, nondistended, + BS MS: no deformity or atrophy  Skin: warm and dry Neuro:  Strength and sensation are intact Psych: euthymic mood, full affect  EKG:  EKG is ordered today. Personal review of the ekg ordered shows sinus rhythm, rate 43, PVC  Recent Labs: No results found for requested labs within last 8760 hours.    Lipid Panel     Component Value Date/Time   CHOL 182 08/08/2017 0032   TRIG 59 08/08/2017 0032   HDL 54 08/08/2017 0032   CHOLHDL 3.4 08/08/2017 0032   VLDL 12 08/08/2017 0032   LDLCALC 116 (H) 08/08/2017 0032     Wt Readings from Last 3 Encounters:  01/13/21 184 lb 9.6 oz (83.7 kg)  11/25/20 184 lb (83.5 kg)  10/30/20 180 lb (81.6 kg)      Other studies Reviewed: Additional studies/ records that were reviewed today include: Coronary CTA 08/09/2017 Review of the above records today demonstrates:  1. Left Main:  No significant stenosis.   2. LAD: No  significant stenosis. 3. LCX: No significant stenosis. 4. RCA: No significant stenosis.   IMPRESSION: 1.  CT FFR analysis didn't show any significant stenosis.  Cardiac monitor 08/06/2020 personally reviewed Max 156 bpm 03:11pm, 04/05 Min 30 bpm 05:37am, 04/08 Avg 48 bpm <1% supraventricular ectopy 4.3% ventricular ectopy Predominant rhythm was sinus rhythm No atrial fibrillation noted Symptoms associated with sinus rhythm and occasionally associated with ventricular bigeminy  TTE 09/01/2018  1. The left ventricle has normal systolic function, with an ejection  fraction of 55-60%. The cavity size was normal. Left ventricular diastolic  parameters were normal.  No evidence of left ventricular regional wall  motion abnormalities.   2. The right ventricle has normal systolic function. The cavity was  normal. There is no increase in right ventricular wall thickness.   3. Left atrial size was mildly dilated.   4. Mild thickening of the mitral valve leaflet. There is mild mitral  annular calcification present.   5. The aortic valve is tricuspid. Mild thickening of the aortic valve.  Mild calcification of the aortic valve. Mild aortic annular calcification  noted.   6. The aortic root is normal in size and structure.   ASSESSMENT AND PLAN:  1.  Sick sinus syndrome: Continues to have resting bradycardia.  He is able to get his heart rate up when he exercises on a treadmill.  This was confirmed on his exercise treadmill test due to that, and no pacemaker implanted at this time.  2.  PVCs: Has a burden of 4.3% on most recent cardiac monitor.  Currently on flecainide 50 mg twice daily.  High risk medication monitoring performed today via ECG.  He had an ETT that showed no QRS widening.  We Andrew Payne continue with current dose.  He does have a few further episodes. He uses cardio that showed possible on ECG when he was walking.  He Andrew Payne send this through  Andrew Payne.  Current medicines are reviewed at  length with the patient today.   The patient does not have concerns regarding his medicines.  The following changes were made today: None  Labs/ tests ordered today include:  Orders Placed This Encounter  Procedures   EKG 12-Lead      Disposition:   FU with Andrew Payne 6 months  Signed, Andrew Payne Meredith Leeds, MD  01/13/2021 11:13 AM     [facility] [address] [place] [address] [phone] (office) [phone] (fax)

## 2021-01-28 DIAGNOSIS — M24572 Contracture, left ankle: Secondary | ICD-10-CM | POA: Diagnosis not present

## 2021-01-28 DIAGNOSIS — M7742 Metatarsalgia, left foot: Secondary | ICD-10-CM | POA: Diagnosis not present

## 2021-01-28 DIAGNOSIS — Q6672 Congenital pes cavus, left foot: Secondary | ICD-10-CM | POA: Diagnosis not present

## 2021-01-31 DIAGNOSIS — Z23 Encounter for immunization: Secondary | ICD-10-CM | POA: Diagnosis not present

## 2021-02-06 ENCOUNTER — Other Ambulatory Visit: Payer: Self-pay | Admitting: Cardiology

## 2021-03-15 ENCOUNTER — Encounter: Payer: Self-pay | Admitting: Cardiology

## 2021-03-27 ENCOUNTER — Telehealth: Payer: Self-pay | Admitting: Cardiology

## 2021-03-27 NOTE — Telephone Encounter (Signed)
I spoke with patient. He was having 2-5 episodes when doing his 4 mile walk.  Episodes described in my chart message. Patient has not had any episodes the last 2 days. He is asking if Dr Curt Bears thinks he should wear a monitor.

## 2021-03-27 NOTE — Telephone Encounter (Signed)
Follow Up:     Patient is calling to find out what was decided about him wearing a Monitor?  He said he sent a message on 03-15-21 in My Chart.

## 2021-04-03 ENCOUNTER — Other Ambulatory Visit: Payer: Self-pay | Admitting: *Deleted

## 2021-04-03 ENCOUNTER — Ambulatory Visit (INDEPENDENT_AMBULATORY_CARE_PROVIDER_SITE_OTHER): Payer: PPO

## 2021-04-03 DIAGNOSIS — I493 Ventricular premature depolarization: Secondary | ICD-10-CM

## 2021-04-03 NOTE — Progress Notes (Unsigned)
Enrolled for [employer] to mail a ZIO XT long term holter monitor to the patients address on file.  

## 2021-04-07 DIAGNOSIS — I493 Ventricular premature depolarization: Secondary | ICD-10-CM | POA: Diagnosis not present

## 2021-04-24 DIAGNOSIS — I493 Ventricular premature depolarization: Secondary | ICD-10-CM | POA: Diagnosis not present

## 2021-04-28 ENCOUNTER — Encounter: Payer: Self-pay | Admitting: Cardiology

## 2021-05-06 ENCOUNTER — Other Ambulatory Visit: Payer: Self-pay | Admitting: Cardiology

## 2021-06-25 DIAGNOSIS — H52221 Regular astigmatism, right eye: Secondary | ICD-10-CM | POA: Diagnosis not present

## 2021-06-25 DIAGNOSIS — H524 Presbyopia: Secondary | ICD-10-CM | POA: Diagnosis not present

## 2021-06-25 DIAGNOSIS — H5212 Myopia, left eye: Secondary | ICD-10-CM | POA: Diagnosis not present

## 2021-06-25 DIAGNOSIS — H5201 Hypermetropia, right eye: Secondary | ICD-10-CM | POA: Diagnosis not present

## 2021-06-25 DIAGNOSIS — H401131 Primary open-angle glaucoma, bilateral, mild stage: Secondary | ICD-10-CM | POA: Diagnosis not present

## 2021-07-21 ENCOUNTER — Ambulatory Visit: Payer: PPO | Admitting: Cardiology

## 2021-08-24 NOTE — Progress Notes (Signed)
? ?PCP:  Ginger Organ., MD ?Primary Cardiologist: None ?Electrophysiologist: [name], MD  ? ?Andrew Payne is a 76 y.o. male seen today for [name], MD for routine electrophysiology followup.  Since last being seen in our clinic the patient reports doing well. He only has palpitations once every 7-10 days.  he denies chest pain, dyspnea, PND, orthopnea, nausea, vomiting, dizziness, syncope, edema, weight gain, or early satiety. ? ?Past Medical History:  ?Diagnosis Date  ? Bradycardia   ? cardiology-- dr Curt Bears--  work-up done included event monitor and echo (all in epic)  ? Carotid artery stenosis   ? per pt H&P dated 10-20-2018 --  <40%  ? Coronary atherosclerosis   ? Glaucoma, right eye   ? History of nuclear stress test 04/15/2005  ? (in epic)  for near syncope--- Low risk normal no ischemia, ef 57%  ? Hx of adenomatous colonic polyps 05/27/2016  ? Internal hemorrhoids   ? Morton neuroma, left   ? OA (osteoarthritis)   ? wrist, elbow, thumb, knees  ? PVC's (premature ventricular contractions)   ? RBBB (right bundle branch block)   ? States heart rate has been as low as 28 and asyptomatic  ? ?Past Surgical History:  ?Procedure Laterality Date  ? COLONOSCOPY  05/2016  ? TA  ? ELBOW SURGERY Left 04/2017  ? EXCISION MORTON'S NEUROMA Left 10/23/2018  ? Procedure: EXCISION MORTON'S NEUROMA LEFT FOOT;  Surgeon: Rosemary Holms, DPM;  Location: [place];  Service: Podiatry;  Laterality: Left;  ? HEMORRHOID BANDING  2018  ? INGUINAL HERNIA REPAIR Left 06/14/2017  ? Procedure: LEFT INGUINAL HERNIA REPAIR ERAS PATHWAY;  Surgeon: Erroll Luna, MD;  Location: [place];  Service: General;  Laterality: Left;  ? INGUINAL HERNIA REPAIR Right 11-18-2009   dr Redmond Pulling  '@[facility]'$   ? INSERTION OF MESH Left 06/14/2017  ? Procedure: INSERTION OF MESH;  Surgeon: Erroll Luna, MD;  Location: [facility];  Service: General;  Laterality: Left;  ? KNEE ARTHROSCOPY W/  MENISCAL REPAIR Right 09/2015  ? POLYPECTOMY    ? TONSILLECTOMY AND ADENOIDECTOMY  child  ? ? ?Current Outpatient Medications  ?Medication Sig Dispense Refill  ? Cholecalciferol (VITAMIN D3) 1000 units CAPS Take 1 capsule by mouth daily.    ? dorzolamide (TRUSOPT) 2 % ophthalmic solution Place 1 drop into the right eye daily.     ? flecainide (TAMBOCOR) 50 MG tablet TAKE 1 TABLET(50 MG) BY MOUTH TWICE DAILY 180 tablet 2  ? latanoprost (XALATAN) 0.005 % ophthalmic solution Place 1 drop into the right eye at bedtime.     ? Omega-3 Fatty Acids (FISH OIL) 1000 MG CAPS Take 1 capsule by mouth 2 (two) times daily.    ? ?No current facility-administered medications for this visit.  ? ? ?Allergies  ?Allergen Reactions  ? Oxycodone Nausea And Vomiting  ? ? ?Social History  ? ?Socioeconomic History  ? Marital status: Married  ?  Spouse name: Not on file  ? Number of children: Not on file  ? Years of education: Not on file  ? Highest education level: Not on file  ?Occupational History  ? Not on file  ?Tobacco Use  ? Smoking status: Never  ? Smokeless tobacco: Never  ?Vaping Use  ? Vaping Use: Never used  ?Substance and Sexual Activity  ? Alcohol use: Not Currently  ? Drug use: No  ? Sexual activity: Not on file  ?Other Topics Concern  ?  Not on file  ?Social History Narrative  ? He is married and retired has a second home in the [place] area \  ? Does drink alcohol no smoking no tobacco no drug use  ? ?Social Determinants of Health  ? ?Financial Resource Strain: Not on file  ?Food Insecurity: Not on file  ?Transportation Needs: Not on file  ?Physical Activity: Not on file  ?Stress: Not on file  ?Social Connections: Not on file  ?Intimate Partner Violence: Not on file  ? ? ? ?Review of Systems: ?All other systems reviewed and are otherwise negative except as noted above. ? ?Physical Exam: ?Vitals:  ? 08/31/21 1002  ?BP: 125/77  ?Pulse: (!) 43  ?SpO2: 100%  ?Weight: 186 lb (84.4 kg)  ?Height: '6\' 2"'$  (1.88 m)  ? ? ?GEN- The  patient is well appearing, alert and oriented x 3 today.   ?HEENT: normocephalic, atraumatic; sclera clear, conjunctiva pink; hearing intact; oropharynx clear; neck supple, no JVP ?Lymph- no cervical lymphadenopathy ?Lungs- Clear to ausculation bilaterally, normal work of breathing.  No wheezes, rales, rhonchi ?Heart- Slow but regular rate and rhythm, no murmurs, rubs or gallops, PMI not laterally displaced ?GI- soft, non-tender, non-distended, bowel sounds present, no hepatosplenomegaly ?Extremities- no clubbing, cyanosis, or edema; DP/PT/radial pulses 2+ bilaterally ?MS- no significant deformity or atrophy ?Skin- warm and dry, no rash or lesion ?Psych- euthymic mood, full affect ?Neuro- strength and sensation are intact ? ?EKG is ordered. Personal review of EKG from today shows sinus bradycardia at 43 bpm, with RBBB and 1st degree AV block, with in acceptable degree of change since starting flecainide in July.  ? ?Additional studies reviewed include: ?Previous EP office notes.  ? ?Assessment and Plan: ? ?1. SSS ?Has asymptomatic, rest bradycardia ?HR increases with treadmill ? ?2. PVC ?Continue flecainide 50 mg BID.  ? ?Follow up with Dr. Curt Bears in 6 months  ? ?Shirley Friar, [name]  ?08/31/21 ?10:04 AM ? ?

## 2021-08-31 ENCOUNTER — Encounter: Payer: Self-pay | Admitting: Student

## 2021-08-31 ENCOUNTER — Ambulatory Visit: Payer: PPO | Admitting: Student

## 2021-08-31 VITALS — BP 125/77 | HR 43 | Ht 74.0 in | Wt 186.0 lb

## 2021-08-31 DIAGNOSIS — I495 Sick sinus syndrome: Secondary | ICD-10-CM

## 2021-08-31 DIAGNOSIS — I493 Ventricular premature depolarization: Secondary | ICD-10-CM

## 2021-08-31 NOTE — Patient Instructions (Signed)
Medication Instructions:  Your physician recommends that you continue on your current medications as directed. Please refer to the Current Medication list given to you today.  *If you need a refill on your cardiac medications before your next appointment, please call your pharmacy*   Lab Work: None If you have labs (blood work) drawn today and your tests are completely normal, you will receive your results only by: MyChart Message (if you have MyChart) OR A paper copy in the mail If you have any lab test that is abnormal or we need to change your treatment, we will call you to review the results.   Follow-Up: At [facility], you and your health needs are our priority.  As part of our continuing mission to provide you with exceptional heart care, we have created designated Provider Care Teams.  These Care Teams include your primary Cardiologist (physician) and Advanced Practice Providers (APPs -  Physician Assistants and Nurse Practitioners) who all work together to provide you with the care you need, when you need it.  Your next appointment:   6 month(s)  The format for your next appointment:   In Person  Provider:   Will Camnitz, MD{   

## 2021-09-02 DIAGNOSIS — D0439 Carcinoma in situ of skin of other parts of face: Secondary | ICD-10-CM | POA: Diagnosis not present

## 2021-09-02 DIAGNOSIS — L570 Actinic keratosis: Secondary | ICD-10-CM | POA: Diagnosis not present

## 2021-09-02 DIAGNOSIS — L821 Other seborrheic keratosis: Secondary | ICD-10-CM | POA: Diagnosis not present

## 2021-10-08 DIAGNOSIS — Z85828 Personal history of other malignant neoplasm of skin: Secondary | ICD-10-CM | POA: Diagnosis not present

## 2021-10-08 DIAGNOSIS — C44329 Squamous cell carcinoma of skin of other parts of face: Secondary | ICD-10-CM | POA: Diagnosis not present

## 2021-10-21 DIAGNOSIS — R7989 Other specified abnormal findings of blood chemistry: Secondary | ICD-10-CM | POA: Diagnosis not present

## 2021-10-21 DIAGNOSIS — E785 Hyperlipidemia, unspecified: Secondary | ICD-10-CM | POA: Diagnosis not present

## 2021-10-21 DIAGNOSIS — Z125 Encounter for screening for malignant neoplasm of prostate: Secondary | ICD-10-CM | POA: Diagnosis not present

## 2021-10-30 ENCOUNTER — Ambulatory Visit: Payer: PPO | Admitting: Cardiovascular Disease

## 2021-10-30 DIAGNOSIS — I493 Ventricular premature depolarization: Secondary | ICD-10-CM | POA: Diagnosis not present

## 2021-10-30 DIAGNOSIS — R82998 Other abnormal findings in urine: Secondary | ICD-10-CM | POA: Diagnosis not present

## 2021-10-30 DIAGNOSIS — I2510 Atherosclerotic heart disease of native coronary artery without angina pectoris: Secondary | ICD-10-CM | POA: Diagnosis not present

## 2021-10-30 DIAGNOSIS — M79672 Pain in left foot: Secondary | ICD-10-CM | POA: Diagnosis not present

## 2021-10-30 DIAGNOSIS — R001 Bradycardia, unspecified: Secondary | ICD-10-CM | POA: Diagnosis not present

## 2021-10-30 DIAGNOSIS — I209 Angina pectoris, unspecified: Secondary | ICD-10-CM | POA: Diagnosis not present

## 2021-10-30 DIAGNOSIS — I495 Sick sinus syndrome: Secondary | ICD-10-CM | POA: Diagnosis not present

## 2021-10-30 DIAGNOSIS — Z0000 Encounter for general adult medical examination without abnormal findings: Secondary | ICD-10-CM | POA: Diagnosis not present

## 2021-10-30 DIAGNOSIS — I6529 Occlusion and stenosis of unspecified carotid artery: Secondary | ICD-10-CM | POA: Diagnosis not present

## 2021-10-30 DIAGNOSIS — Z1331 Encounter for screening for depression: Secondary | ICD-10-CM | POA: Diagnosis not present

## 2021-10-30 DIAGNOSIS — Z1389 Encounter for screening for other disorder: Secondary | ICD-10-CM | POA: Diagnosis not present

## 2021-10-30 DIAGNOSIS — N1831 Chronic kidney disease, stage 3a: Secondary | ICD-10-CM | POA: Diagnosis not present

## 2021-10-30 DIAGNOSIS — E785 Hyperlipidemia, unspecified: Secondary | ICD-10-CM | POA: Diagnosis not present

## 2021-10-30 NOTE — Telephone Encounter (Signed)
Spoke with pt and recommended pt establish with general cardiology due to increasing intermittent symptoms of chest discomfort climbing stairs of inclines x 2-3 weeks.  Pt completed Cardiac CT in 2019 and advised aggressive medical therapy.  Pt has not been on a statin but does take Omega 3 and walks daily.  Reviewed ED precautions.  Offered appointment today with Dr Angelena Form, DOD and pt declined.  Appointment scheduled with Dr Ali Lowe, DOD for 11/02/2021.  Recommended pt not push himself physically this weekend.   Pt verbalizes understanding and agrees with current plan.

## 2021-10-30 NOTE — Progress Notes (Unsigned)
Cardiology Office Note:    Date:  10/30/2021   ID:  Andrew Payne, DOB [birth year], MRN [id number]  PCP:  Ginger Organ., MD   [facility] Providers Cardiologist:  Lenna Sciara, MD Referring MD: Ginger Organ., MD   Chief Complaint/Reason for Referral: Chest pain  ASSESSMENT:    1. Precordial pain   2. Coronary artery disease involving native coronary artery of native heart, unspecified whether angina present   3. Coronary artery calcification seen on CAT scan   4. Sick sinus syndrome ([place])   5. Hyperlipidemia, unspecified hyperlipidemia type     PLAN:    In order of problems listed above: 1.  Chest pain: 2.  Coronary artery disease: Based on CT scan patient has mild obstructive coronary artery disease from a few years ago.  Continue aspirin and statin. 3.  Coronary artery calcification: See #2 above. 4.  Sick sinus syndrome: Followed by EP. 5.  Hyperlipidemia: Given known carotid and coronary disease patient LDL goal is less than 70.         {Are you ordering a CV Procedure (e.g. stress test, cath, DCCV, TEE, etc)?   Press F2        :[id number]}   Dispo:  No follow-ups on file.      Medication Adjustments/Labs and Tests Ordered: Current medicines are reviewed at length with the patient today.  Concerns regarding medicines are outlined above.  The following changes have been made:  {PLAN; NO CHANGE:13088:s}   Labs/tests ordered: No orders of the defined types were placed in this encounter.   Medication Changes: No orders of the defined types were placed in this encounter.    Current medicines are reviewed at length with the patient today.  The patient {ACTIONS; HAS/DOES NOT HAVE:19233} concerns regarding medicines.   History of Present Illness:    FOCUSED PROBLEM LIST:   1.  Sick sinus syndrome followed by EP without permanent pacemaker placement 2.  Mild carotid stenosis 3.  Coronary artery calcification on CT scan 2017; 40th percentile for  age at that time; CTA FFR 2019 negative 4.  Right bundle branch block  The patient is a 76 y.o. male with the indicated medical history here for chest pain.          Current Medications: No outpatient medications have been marked as taking for the 11/02/21 encounter (Office Visit) with Early Osmond, MD.     Allergies:    Oxycodone   Social History:   Social History   Tobacco Use   Smoking status: Never   Smokeless tobacco: Never  Vaping Use   Vaping Use: Never used  Substance Use Topics   Alcohol use: Not Currently   Drug use: No     Family Hx: Family History  Problem Relation Age of Onset   Heart disease Mother    Alzheimer's disease Father    Colon polyps Father    Colon cancer Neg Hx    Pancreatic cancer Neg Hx    Prostate cancer Neg Hx    Rectal cancer Neg Hx    Esophageal cancer Neg Hx      Review of Systems:   Please see the history of present illness.    All other systems reviewed and are negative.     EKGs/Labs/Other Test Reviewed:    EKG:  EKG performed 2023 that I personally reviewed demonstrates sinus rhythm right bundle branch block; EKG performed today that I personally reviewed demonstrates ***.  Prior  CV studies:  2022 monitor low burden of ventricular ectopy in sinus rhythm  2022 ETT negative for ischemia however PVC burden did increase with exercise  2021 carotid Dopplers with mild obstructive disease bilaterally  2020 TTE ejection fraction of 55 to 60% with mild aortic calcification and no significant valvular abnormalities  2019 CTA FFR negative for hemodynamically significant stenoses with mild obstructive disease; no aortic atherosclerosis  2017 calcium score 40th percentile  Other studies Reviewed: Review of the additional studies/records demonstrates: None relevant  Recent Labs: No results found for requested labs within last 365 days.   Recent Lipid Panel Lab Results  Component Value Date/Time   CHOL 182 08/08/2017  12:32 AM   TRIG 59 08/08/2017 12:32 AM   HDL 54 08/08/2017 12:32 AM   LDLCALC 116 (H) 08/08/2017 12:32 AM    Risk Assessment/Calculations:    {Does this patient have ATRIAL FIBRILLATION?[phone]}      Physical Exam:    VS:  There were no vitals taken for this visit.   Wt Readings from Last 3 Encounters:  08/31/21 186 lb (84.4 kg)  01/13/21 184 lb 9.6 oz (83.7 kg)  11/25/20 184 lb (83.5 kg)    GENERAL:  No apparent distress, AOx3 HEENT:  No carotid bruits, +2 carotid impulses, no scleral icterus CAR: RRR Irregular RR*** no murmurs***, gallops, rubs, or thrills RES:  Clear to auscultation bilaterally ABD:  Soft, nontender, nondistended, positive bowel sounds x 4 VASC:  +2 radial pulses, +2 carotid pulses, palpable pedal pulses NEURO:  CN 2-12 grossly intact; motor and sensory grossly intact PSYCH:  No active depression or anxiety EXT:  No edema, ecchymosis, or cyanosis  Signed, Early Osmond, MD  10/30/2021 1:40 PM    [facility] [place], [place], [place]  [address] Phone: [phone]; Fax: [phone]   Note:  This document was prepared using Dragon voice recognition software and may include unintentional dictation errors.

## 2021-11-02 ENCOUNTER — Encounter: Payer: Self-pay | Admitting: Internal Medicine

## 2021-11-02 ENCOUNTER — Ambulatory Visit: Payer: PPO | Admitting: Internal Medicine

## 2021-11-02 ENCOUNTER — Telehealth: Payer: Self-pay

## 2021-11-02 VITALS — BP 142/88 | HR 40 | Ht 74.0 in | Wt 178.2 lb

## 2021-11-02 DIAGNOSIS — I2510 Atherosclerotic heart disease of native coronary artery without angina pectoris: Secondary | ICD-10-CM | POA: Diagnosis not present

## 2021-11-02 DIAGNOSIS — R030 Elevated blood-pressure reading, without diagnosis of hypertension: Secondary | ICD-10-CM

## 2021-11-02 DIAGNOSIS — E785 Hyperlipidemia, unspecified: Secondary | ICD-10-CM | POA: Diagnosis not present

## 2021-11-02 DIAGNOSIS — R072 Precordial pain: Secondary | ICD-10-CM | POA: Diagnosis not present

## 2021-11-02 DIAGNOSIS — I495 Sick sinus syndrome: Secondary | ICD-10-CM | POA: Diagnosis not present

## 2021-11-02 MED ORDER — ISOSORBIDE MONONITRATE ER 30 MG PO TB24: 30.0000 mg | ORAL_TABLET | Freq: Every day | ORAL | 3 refills | Status: DC

## 2021-11-02 MED ORDER — ATORVASTATIN CALCIUM 40 MG PO TABS: 40.0000 mg | ORAL_TABLET | Freq: Every day | ORAL | 3 refills | Status: DC

## 2021-11-02 MED ORDER — ASPIRIN 81 MG PO TBEC: 81.0000 mg | DELAYED_RELEASE_TABLET | Freq: Every day | ORAL | Status: AC

## 2021-11-02 NOTE — Patient Instructions (Addendum)
Medication Instructions:  Your physician has recommended you make the following change in your medication:   1) START Aspirin '81mg'$  daily 2) START Atorvastatin '40mg'$  daily 3) START Isosorbide Mononitrate (Imdur) '30mg'$  at bedtime 4) STOP Fish Oil  *If you need a refill on your cardiac medications before your next appointment, please call your pharmacy*  Lab Work: In 2 months: CMP, Lipid panel, LP(a) If you have labs (blood work) drawn today and your tests are completely normal, you will receive your results only by: [place] (if you have MyChart) OR A paper copy in the mail If you have any lab test that is abnormal or we need to change your treatment, we will call you to review the results.   Testing/Procedures: Your physician has recommended you have a coronary CTA performed.  Your physician has requested that you have an echocardiogram. Echocardiography is a painless test that uses sound waves to create images of your heart. It provides your doctor with information about the size and shape of your heart and how well your heart's chambers and valves are working. This procedure takes approximately one hour. There are no restrictions for this procedure.  Follow-Up: At [facility], you and your health needs are our priority.  As part of our continuing mission to provide you with exceptional heart care, we have created designated Provider Care Teams.  These Care Teams include your primary Cardiologist (physician) and Advanced Practice Providers (APPs -  Physician Assistants and Nurse Practitioners) who all work together to provide you with the care you need, when you need it.  Your next appointment:   3 month(s)  The format for your next appointment:   In Person  Provider:   Early Osmond, MD {  Other Instructions   Your cardiac CT will be scheduled at the below location:   [facility] [place], [place] [address] [phone]  Please  arrive at the [facility] and Children's Entrance (Entrance C2) of [facility] 30 minutes prior to test start time. You can use the FREE valet parking offered at entrance C (encouraged to control the heart rate for the test)  Proceed to the [facility] Radiology Department (first floor) to check-in and test prep.  All radiology patients and guests should use entrance C2 at [facility], accessed from [facility], even though the hospital's physical address listed is [address].    Please follow these instructions carefully (unless otherwise directed):  Hold all erectile dysfunction medications at least 3 days (72 hrs) prior to test.  On the Night Before the Test: Be sure to Drink plenty of water. Do not consume any caffeinated/decaffeinated beverages or chocolate 12 hours prior to your test. Do not take any antihistamines 12 hours prior to your test.  On the Day of the Test: Drink plenty of water until 1 hour prior to the test. Do not eat any food 4 hours prior to the test. You may take your regular medications prior to the test.   After the Test: Drink plenty of water. After receiving IV contrast, you may experience a mild flushed feeling. This is normal. On occasion, you may experience a mild rash up to 24 hours after the test. This is not dangerous. If this occurs, you can take Benadryl 25 mg and increase your fluid intake. If you experience trouble breathing, this can be serious. If it is severe call 911 IMMEDIATELY. If it is mild, please call our  office. If you take any of these medications: Glipizide/Metformin, Avandament, Glucavance, please do not take 48 hours after completing test unless otherwise instructed.  We will call to schedule your test 2-4 weeks out understanding that some insurance companies will need an authorization prior to the service being performed.   For non-scheduling related questions, please contact the cardiac imaging  nurse navigator should you have any questions/concerns: Marchia Bond, Cardiac Imaging Nurse Navigator Gordy Clement, Cardiac Imaging Nurse Navigator [facility] Direct Office Dial: [phone]   For scheduling needs, including cancellations and rescheduling, please call [place], [phone].   Important Information About Sugar

## 2021-11-02 NOTE — Telephone Encounter (Signed)
Called Dr. Raul Del office, transferred to medical records and left message for [facility] requesting a copy of lab work from 10/23/21 to be faxed our office for review for upcoming CT scan. Requesting either a BMET or CMP. Provided office fax number [phone] and office phone number [phone].

## 2021-11-02 NOTE — Addendum Note (Signed)
Addended by: Molli Barrows on: 11/02/2021 12:12 PM   Modules accepted: Orders

## 2021-11-12 ENCOUNTER — Ambulatory Visit (HOSPITAL_COMMUNITY): Payer: PPO | Attending: Internal Medicine

## 2021-11-12 DIAGNOSIS — R072 Precordial pain: Secondary | ICD-10-CM | POA: Insufficient documentation

## 2021-11-12 LAB — ECHOCARDIOGRAM COMPLETE
AR max vel: 1.64 cm2
AV Area VTI: 1.57 cm2
AV Area mean vel: 1.46 cm2
AV Mean grad: 8 mmHg
AV Peak grad: 14.9 mmHg
Ao pk vel: 1.93 m/s
Area-P 1/2: 5.88 cm2
S' Lateral: 3.1 cm

## 2021-11-23 ENCOUNTER — Telehealth (HOSPITAL_COMMUNITY): Payer: Self-pay | Admitting: Emergency Medicine

## 2021-11-23 NOTE — Telephone Encounter (Signed)
Reaching out to patient to offer assistance regarding upcoming cardiac imaging study; pt verbalizes understanding of appt date/time, parking situation and where to check in, pre-test NPO status and medications ordered, and verified current allergies; name and call back number provided for further questions should they arise Andrew Bond RN Navigator Cardiac Imaging [facility] [phone] office [phone] cell  Denies iv issues Taking daily meds Aware of nitro Check in W/C entrance 1200

## 2021-11-24 ENCOUNTER — Encounter: Payer: Self-pay | Admitting: Internal Medicine

## 2021-11-24 ENCOUNTER — Ambulatory Visit (HOSPITAL_COMMUNITY)
Admission: RE | Admit: 2021-11-24 | Discharge: 2021-11-24 | Disposition: A | Discharge status: Home / Self Care | Payer: PPO | Source: Ambulatory Visit | Attending: Internal Medicine | Admitting: Internal Medicine

## 2021-11-24 DIAGNOSIS — R072 Precordial pain: Secondary | ICD-10-CM | POA: Diagnosis not present

## 2021-11-24 MED ORDER — NITROGLYCERIN 0.4 MG SL SUBL
SUBLINGUAL_TABLET | SUBLINGUAL | Status: AC
  Filled 2021-11-24: qty 2

## 2021-11-24 MED ORDER — IOHEXOL 350 MG/ML SOLN
95.0000 mL | Freq: Once | INTRAVENOUS | Status: AC | PRN
  Administered 2021-11-24: 95 mL via INTRAVENOUS

## 2021-11-24 MED ORDER — NITROGLYCERIN 0.4 MG SL SUBL
0.8000 mg | SUBLINGUAL_TABLET | Freq: Once | SUBLINGUAL | Status: AC
  Administered 2021-11-24: 0.8 mg via SUBLINGUAL

## 2021-12-09 ENCOUNTER — Other Ambulatory Visit (HOSPITAL_COMMUNITY): Payer: Self-pay | Admitting: Radiology

## 2021-12-09 DIAGNOSIS — R0602 Shortness of breath: Secondary | ICD-10-CM

## 2021-12-21 ENCOUNTER — Ambulatory Visit (HOSPITAL_COMMUNITY)
Admission: RE | Admit: 2021-12-21 | Discharge: 2021-12-21 | Disposition: A | Discharge status: Home / Self Care | Payer: PPO | Source: Ambulatory Visit | Attending: Internal Medicine | Admitting: Internal Medicine

## 2021-12-21 DIAGNOSIS — R0602 Shortness of breath: Secondary | ICD-10-CM | POA: Diagnosis not present

## 2021-12-21 LAB — PULMONARY FUNCTION TEST
DL/VA % pred: 100 %
DL/VA: 3.95 ml/min/mmHg/L
DLCO unc % pred: 98 %
DLCO unc: 26.52 ml/min/mmHg
FEF 25-75 Post: 3.71 L/sec
FEF 25-75 Pre: 2.76 L/sec
FEF2575-%Change-Post: 34 %
FEF2575-%Pred-Post: 150 %
FEF2575-%Pred-Pre: 112 %
FEV1-%Change-Post: 5 %
FEV1-%Pred-Post: 107 %
FEV1-%Pred-Pre: 101 %
FEV1-Post: 3.63 L
FEV1-Pre: 3.43 L
FEV1FVC-%Change-Post: 5 %
FEV1FVC-%Pred-Pre: 104 %
FEV6-%Change-Post: 2 %
FEV6-%Pred-Post: 103 %
FEV6-%Pred-Pre: 101 %
FEV6-Post: 4.5 L
FEV6-Pre: 4.4 L
FEV6FVC-%Change-Post: 1 %
FEV6FVC-%Pred-Post: 105 %
FEV6FVC-%Pred-Pre: 103 %
FVC-%Change-Post: 0 %
FVC-%Pred-Post: 98 %
FVC-%Pred-Pre: 97 %
FVC-Post: 4.53 L
FVC-Pre: 4.5 L
Post FEV1/FVC ratio: 80 %
Post FEV6/FVC ratio: 99 %
Pre FEV1/FVC ratio: 76 %
Pre FEV6/FVC Ratio: 98 %
RV % pred: 75 %
RV: 2 L
TLC % pred: 90 %
TLC: 6.77 L

## 2021-12-21 MED ORDER — ALBUTEROL SULFATE (2.5 MG/3ML) 0.083% IN NEBU
2.5000 mg | INHALATION_SOLUTION | Freq: Once | RESPIRATORY_TRACT | Status: AC
  Administered 2021-12-21: 2.5 mg via RESPIRATORY_TRACT

## 2021-12-30 ENCOUNTER — Ambulatory Visit (INDEPENDENT_AMBULATORY_CARE_PROVIDER_SITE_OTHER): Payer: PPO

## 2021-12-30 ENCOUNTER — Ambulatory Visit: Payer: PPO | Admitting: Internal Medicine

## 2021-12-30 ENCOUNTER — Encounter: Payer: Self-pay | Admitting: Internal Medicine

## 2021-12-30 DIAGNOSIS — R0609 Other forms of dyspnea: Secondary | ICD-10-CM

## 2021-12-30 DIAGNOSIS — R079 Chest pain, unspecified: Secondary | ICD-10-CM | POA: Diagnosis not present

## 2021-12-30 NOTE — Assessment & Plan Note (Addendum)
Never smoker - onset was around 2019/2020  -Echo 11/12/21 Mild MR / LAE  -PFT's  12/21/21  FEV1 3.63 (107 % ) ratio 0.80  p 5 % improvement from saba p 0 prior to study with DLCO  nl and  FV curve nl   - 12/30/2021   Walked on RA  x  3  lap(s) =  approx 750  ft  @ fast pace, stopped due to end of study s sob with lowest 02 sats 96% and pulse only 62%  >>> rec paced sub max treadmill workouts monitoring peak sats and pulse rates followed by formal CPST on treadmill    The hx is c/w angina at peak exercise which only occurs when [place] to his hx and lasts until he levels out.  Therefore only way to bring it out would be on a treadmill set up at a gradually higher incline as per CPST which would also prove the absence of a ventilatory limitation or EIA or desats all of which are very unlikely in this setting.   See re above which he should discuss with cards prior to pushing to this level of ex (ie CPST should be set up by cards if agreed ok to push this hard) and in meantim should do sub max ex with gradient on treadmill at least 30 min daily.  Discussed in detail all the  indications, usual  risks and alternatives  relative to the benefits with patient who agrees to proceed with w/u as outlined.     Each maintenance medication was reviewed in detail including emphasizing most importantly the difference between maintenance and prns and under what circumstances the prns are to be triggered using an action plan format where appropriate.  Total time for H and P, chart review, counseling,  directly observing portions of ambulatory 02 saturation study/ and generating customized AVS unique to this office visit / same day charting = > 45 min new pt eval

## 2021-12-30 NOTE — Patient Instructions (Addendum)
To get the most out of exercise, you need to be continuously aware that you are a little short of breath, but never out of breath, for at least 30 minutes daily. As you improve, it will actually be easier for you to do the same amount of exercise  in  30 minutes so always push to the level where you are short of breath.  Once you can do this, push for longer duration or repeat it after at least 4 hours of rest.  Make sure you check your oxygen saturations and your pulse at highest level of activity and let me know if you are losing ground.   If not improving by the time you see your heart doctor, would strong recommend you consider doing a CPST.    Please remember to go to the  x-ray department  for your tests - we will call you with the results when they are available     Pulmonary follow up is as needed

## 2021-12-30 NOTE — Progress Notes (Signed)
Andrew Payne, male    DOB: Feb 01, 1946   MRN: [id number]   Brief patient profile:  12  yowm never smoker referred to pulmonary clinic 12/30/2021 by Dr Brigitte Pulse for doe x late 2019/2020 (prior to covid outbreak)     Echo 11/12/21 Mild MR / LAE  PFT's  12/21/21  FEV1 3.63 (107 % ) ratio 0.80  p 5 % improvement from saba p 0 prior to study with DLCO  nl and  FV curve nl      History of Present Illness  12/30/2021  Pulmonary/ 1st office eval/Timoty Bourke  Chief Complaint  Patient presents with   Consult    Sob with exertion x 2+ yrs.   Dyspnea:  only with hills/ progressed x 3 years to point where bothered by any incline but not consistently - still walking 5 miles per day including [place] -  sob assoc with chest tight  continues while on incline until get to flatter parts then eases up  Never occurs on treadmill but does not use incline on treamill more than a few degrees Cough: none  Sleep: flat bed / one pillow  SABA use: none   No obvious day to day or daytime pattern/variability or assoc excess/ purulent sputum or mucus plugs or hemoptysis or  subjective wheeze or overt sinus or hb symptoms.   Sleeping  without nocturnal  or early am exacerbation  of respiratory  c/o's or need for noct saba. Also denies any obvious fluctuation of symptoms with weather or environmental changes or other aggravating or alleviating factors except as outlined above   No unusual exposure hx or h/o childhood pna/ asthma or knowledge of premature birth.  Current Allergies, Complete Past Medical History, Past Surgical History, Family History, and Social History were reviewed in [employer] record.  ROS  The following are not active complaints unless bolded Hoarseness, sore throat, dysphagia, dental problems, itching, sneezing,  nasal congestion or discharge of excess mucus or purulent secretions, ear ache,   fever, chills, sweats, unintended wt loss or wt gain, classically pleuritic or exertional  cp,  orthopnea pnd or arm/hand swelling  or leg swelling, presyncope, palpitations (both very mild) abdominal pain, anorexia, nausea, vomiting, diarrhea  or change in bowel habits or change in bladder habits, change in stools or change in urine, dysuria, hematuria,  rash, arthralgias, visual complaints, headache, numbness, weakness or ataxia or problems with walking or coordination,  change in mood or  memory.           Past Medical History:  Diagnosis Date   Bradycardia    cardiology-- dr Curt Bears--  work-up done included event monitor and echo (all in epic)   Carotid artery stenosis    per pt H&P dated 10-20-2018 --  <40%   Coronary atherosclerosis    Glaucoma, right eye    History of nuclear stress test 04/15/2005   (in epic)  for near syncope--- Low risk normal no ischemia, ef 57%   Hx of adenomatous colonic polyps 05/27/2016   Internal hemorrhoids    Morton neuroma, left    OA (osteoarthritis)    wrist, elbow, thumb, knees   PVC's (premature ventricular contractions)    RBBB (right bundle branch block)    States heart rate has been as low as 28 and asyptomatic    Outpatient Medications Prior to Visit  Medication Sig Dispense Refill   aspirin EC 81 MG tablet Take 1 tablet (81 mg total) by mouth daily. Swallow  whole.     atorvastatin (LIPITOR) 40 MG tablet Take 1 tablet (40 mg total) by mouth daily. 90 tablet 3   Cholecalciferol (VITAMIN D3) 1000 units CAPS Take 1 capsule by mouth daily.     dorzolamide (TRUSOPT) 2 % ophthalmic solution Place 1 drop into the right eye daily.      flecainide (TAMBOCOR) 50 MG tablet TAKE 1 TABLET(50 MG) BY MOUTH TWICE DAILY 180 tablet 2   latanoprost (XALATAN) 0.005 % ophthalmic solution Place 1 drop into the right eye at bedtime.      No facility-administered medications prior to visit.     Objective:     BP 132/68 (BP Location: Left Arm, Cuff Size: Normal)   Pulse (!) 44   Temp 97.8 F (36.6 C) (Temporal)   Ht '6\' 2"'$  (1.88 m)   Wt 182 lb  6.4 oz (82.7 kg)   SpO2 100% Comment: RA  BMI 23.42 kg/m   SpO2: 100 % (RA)  Amb very healthy appearing amb wm nad   HEENT : Oropharynx  clear      Nasal turbinates nl    NECK :  without  apparent JVD/ palpable Nodes/TM    LUNGS: no acc muscle use,  Nl contour chest which is clear to A and P bilaterally without cough on insp or exp maneuvers   CV:  RRR  no s3 or murmur or increase in P2, and no edema   ABD:  soft and nontender with nl inspiratory excursion in the supine position. No bruits or organomegaly appreciated   MS:  Nl gait/ ext warm without deformities Or obvious joint restrictions  calf tenderness, cyanosis or clubbing    SKIN: warm and dry without lesions    NEURO:  alert, approp, nl sensorium with  no motor or cerebellar deficits apparent.   CXR PA and Lateral:   12/30/2021 :    I personally reviewed images and impression is as follows:     Wnl    I personally reviewed images and agree with radiology impression as follows:   Chest CT cuts on coronary study  11/24/21  No definite abnormality seen involving the visualized extracardiac structures of the chest.        Assessment   DOE (dyspnea on exertion) Never smoker - onset was around 2019/2020  -Echo 11/12/21 Mild MR / LAE  -PFT's  12/21/21  FEV1 3.63 (107 % ) ratio 0.80  p 5 % improvement from saba p 0 prior to study with DLCO  nl and  FV curve nl   - 12/30/2021   Walked on RA  x  3  lap(s) =  approx 750  ft  @ fast pace, stopped due to end of study s sob with lowest 02 sats 96% and pulse only 62%  >>> rec paced sub max treadmill workouts monitoring peak sats and pulse rates followed by formal CPST on treadmill    The hx is c/w angina at peak exercise which only occurs when [place] to his hx and lasts until he levels out.  Therefore only way to bring it out would be on a treadmill set up at a gradually higher incline as per CPST which would also prove the absence of a  ventilatory limitation or EIA or desats all of which are very unlikely in this setting.   See re above which he should discuss with cards prior to pushing to this level of ex (ie CPST should  be set up by cards if agreed ok to push this hard) and in meantim should do sub max ex with gradient on treadmill at least 30 min daily.  Discussed in detail all the  indications, usual  risks and alternatives  relative to the benefits with patient who agrees to proceed with w/u as outlined.     Each maintenance medication was reviewed in detail including emphasizing most importantly the difference between maintenance and prns and under what circumstances the prns are to be triggered using an action plan format where appropriate.  Total time for H and P, chart review, counseling,  directly observing portions of ambulatory 02 saturation study/ and generating customized AVS unique to this office visit / same day charting = > 45 min new pt eval              Christinia Gully, MD 12/30/2021

## 2022-01-04 ENCOUNTER — Ambulatory Visit: Payer: PPO | Attending: Internal Medicine

## 2022-01-04 DIAGNOSIS — I2510 Atherosclerotic heart disease of native coronary artery without angina pectoris: Secondary | ICD-10-CM | POA: Diagnosis not present

## 2022-01-04 DIAGNOSIS — E785 Hyperlipidemia, unspecified: Secondary | ICD-10-CM

## 2022-01-04 DIAGNOSIS — R030 Elevated blood-pressure reading, without diagnosis of hypertension: Secondary | ICD-10-CM | POA: Diagnosis not present

## 2022-01-04 NOTE — Progress Notes (Signed)
Called and left detailed msg on machine ok per DPR.

## 2022-01-06 DIAGNOSIS — J939 Pneumothorax, unspecified: Secondary | ICD-10-CM | POA: Diagnosis not present

## 2022-01-06 DIAGNOSIS — R001 Bradycardia, unspecified: Secondary | ICD-10-CM | POA: Diagnosis not present

## 2022-01-06 DIAGNOSIS — I493 Ventricular premature depolarization: Secondary | ICD-10-CM | POA: Diagnosis not present

## 2022-01-06 DIAGNOSIS — W1789XA Other fall from one level to another, initial encounter: Secondary | ICD-10-CM | POA: Diagnosis not present

## 2022-01-06 DIAGNOSIS — R778 Other specified abnormalities of plasma proteins: Secondary | ICD-10-CM | POA: Diagnosis not present

## 2022-01-06 DIAGNOSIS — S27321A Contusion of lung, unilateral, initial encounter: Secondary | ICD-10-CM | POA: Diagnosis not present

## 2022-01-06 DIAGNOSIS — W19XXXA Unspecified fall, initial encounter: Secondary | ICD-10-CM | POA: Diagnosis not present

## 2022-01-06 DIAGNOSIS — S2243XA Multiple fractures of ribs, bilateral, initial encounter for closed fracture: Secondary | ICD-10-CM | POA: Diagnosis not present

## 2022-01-06 DIAGNOSIS — S272XXA Traumatic hemopneumothorax, initial encounter: Secondary | ICD-10-CM | POA: Diagnosis not present

## 2022-01-06 DIAGNOSIS — I080 Rheumatic disorders of both mitral and aortic valves: Secondary | ICD-10-CM | POA: Diagnosis not present

## 2022-01-06 DIAGNOSIS — S2249XA Multiple fractures of ribs, unspecified side, initial encounter for closed fracture: Secondary | ICD-10-CM | POA: Diagnosis not present

## 2022-01-06 DIAGNOSIS — S3992XA Unspecified injury of lower back, initial encounter: Secondary | ICD-10-CM | POA: Diagnosis not present

## 2022-01-06 DIAGNOSIS — S271XXA Traumatic hemothorax, initial encounter: Secondary | ICD-10-CM | POA: Diagnosis not present

## 2022-01-06 DIAGNOSIS — S0003XA Contusion of scalp, initial encounter: Secondary | ICD-10-CM | POA: Diagnosis not present

## 2022-01-06 DIAGNOSIS — R0609 Other forms of dyspnea: Secondary | ICD-10-CM | POA: Diagnosis not present

## 2022-01-06 DIAGNOSIS — S270XXA Traumatic pneumothorax, initial encounter: Secondary | ICD-10-CM | POA: Diagnosis not present

## 2022-01-06 DIAGNOSIS — S299XXA Unspecified injury of thorax, initial encounter: Secondary | ICD-10-CM | POA: Diagnosis not present

## 2022-01-06 DIAGNOSIS — I4510 Unspecified right bundle-branch block: Secondary | ICD-10-CM | POA: Diagnosis not present

## 2022-01-06 DIAGNOSIS — I2510 Atherosclerotic heart disease of native coronary artery without angina pectoris: Secondary | ICD-10-CM | POA: Diagnosis not present

## 2022-01-06 DIAGNOSIS — S2241XA Multiple fractures of ribs, right side, initial encounter for closed fracture: Secondary | ICD-10-CM | POA: Diagnosis not present

## 2022-01-06 DIAGNOSIS — J942 Hemothorax: Secondary | ICD-10-CM | POA: Diagnosis not present

## 2022-01-06 DIAGNOSIS — S0990XA Unspecified injury of head, initial encounter: Secondary | ICD-10-CM | POA: Diagnosis not present

## 2022-01-06 DIAGNOSIS — T1490XA Injury, unspecified, initial encounter: Secondary | ICD-10-CM | POA: Diagnosis not present

## 2022-01-06 DIAGNOSIS — W138XXA Fall from, out of or through other building or structure, initial encounter: Secondary | ICD-10-CM | POA: Diagnosis not present

## 2022-01-06 DIAGNOSIS — M8580 Other specified disorders of bone density and structure, unspecified site: Secondary | ICD-10-CM | POA: Diagnosis not present

## 2022-01-06 DIAGNOSIS — J90 Pleural effusion, not elsewhere classified: Secondary | ICD-10-CM | POA: Diagnosis not present

## 2022-01-06 DIAGNOSIS — I495 Sick sinus syndrome: Secondary | ICD-10-CM | POA: Diagnosis not present

## 2022-01-06 LAB — LIPID PANEL
Chol/HDL Ratio: 1.9 ratio (ref 0.0–5.0)
Cholesterol, Total: 119 mg/dL (ref 100–199)
HDL: 63 mg/dL (ref >39)
LDL Chol Calc (NIH): 42 mg/dL (ref 0–99)
Triglycerides: 64 mg/dL (ref 0–149)
VLDL Cholesterol Cal: 14 mg/dL (ref 5–40)

## 2022-01-06 LAB — COMPREHENSIVE METABOLIC PANEL
ALT: 17 IU/L (ref 0–44)
AST: 24 IU/L (ref 0–40)
Albumin/Globulin Ratio: 1.8 (ref 1.2–2.2)
Albumin: 4.1 g/dL (ref 3.8–4.8)
Alkaline Phosphatase: 98 IU/L (ref 44–121)
BUN/Creatinine Ratio: 16 (ref 10–24)
BUN: 18 mg/dL (ref 8–27)
Bilirubin Total: 0.8 mg/dL (ref 0.0–1.2)
CO2: 26 mmol/L (ref 20–29)
Calcium: 9.4 mg/dL (ref 8.6–10.2)
Chloride: 106 mmol/L (ref 96–106)
Creatinine, Ser: 1.14 mg/dL (ref 0.76–1.27)
Globulin, Total: 2.3 g/dL (ref 1.5–4.5)
Glucose: 72 mg/dL (ref 70–99)
Potassium: 4.6 mmol/L (ref 3.5–5.2)
Sodium: 143 mmol/L (ref 134–144)
Total Protein: 6.4 g/dL (ref 6.0–8.5)
eGFR: 67 mL/min/{1.73_m2} (ref >59)

## 2022-01-06 LAB — LIPOPROTEIN A (LPA): Lipoprotein (a): 9.4 nmol/L (ref <75.0)

## 2022-01-08 ENCOUNTER — Encounter: Payer: Self-pay | Admitting: Cardiology

## 2022-01-11 ENCOUNTER — Telehealth: Payer: Self-pay | Admitting: Cardiology

## 2022-01-11 NOTE — Telephone Encounter (Signed)
Left message to call back  

## 2022-01-11 NOTE — Telephone Encounter (Signed)
   Pt said, he fell and broke 9 ribs. While in the hospital he ws having issue with bradycardia, PVC and some  meds change. He made an appt with Oda Kilts on 09/28 but he also requesting to speak with RN Venida Jarvis

## 2022-01-12 NOTE — Telephone Encounter (Signed)
Pt returning call

## 2022-01-12 NOTE — Telephone Encounter (Signed)
Pt reports recent hospitalization with a lot of testing. States he was taken off Flecainide but not sure exactly why they said he should not be taking it. They also mentioned that he may need a pacemaker. Pt aware I will review notes, but we would most likely wait until appt next week to further determine next step in treatment plan. Pt agreeable to plan.

## 2022-01-13 DIAGNOSIS — S2243XS Multiple fractures of ribs, bilateral, sequela: Secondary | ICD-10-CM | POA: Diagnosis not present

## 2022-01-13 DIAGNOSIS — J939 Pneumothorax, unspecified: Secondary | ICD-10-CM | POA: Diagnosis not present

## 2022-01-13 DIAGNOSIS — J942 Hemothorax: Secondary | ICD-10-CM | POA: Diagnosis not present

## 2022-01-13 DIAGNOSIS — R0602 Shortness of breath: Secondary | ICD-10-CM | POA: Diagnosis not present

## 2022-01-14 NOTE — Progress Notes (Signed)
PCP:  Ginger Organ., MD Primary Cardiologist: Early Osmond, MD Electrophysiologist: Constance Haw, MD   Andrew Payne is a 76 y.o. male seen today for [name], MD for post hospital follow up.    Seen in hospital in [place] after a traumatic fall (>47f) which resulted in multiple rib fractures, small left hemopneumothorax  Flecainide appears to have been stopped due to bradycardia with cardiology consulting. Echo 01/07/2022 showed LVEF 60-65%.   Since discharge from hospital the patient reports doing OK, but very sore and a long way from his baseline. However, he has worked back up to walking 0.75 mile a day. His Hrs range 70-110s with exercise, but his O2 has also dropped at times.  Dr. SBrigitte Pulseraised question of R > L shunt, though this was not demonstrated on recent Echo or prior CT coronary morph (will confirm whether this would show up). Currently, he denies dizziness or lightheadedness. He is not sure what exactly happened when he fell. He states he was working on the rails of the porch and had set one down but not secured.  His wife "heard" but did not see the fall, and he has no memory of the moments leading up the fall, only remembers waking up with EMS around him, and in pain.   Past Medical History:  Diagnosis Date   Bradycardia    cardiology-- dr cCurt Bears-  work-up done included event monitor and echo (all in epic)   Carotid artery stenosis    per pt H&P dated 10-20-2018 --  <40%   Coronary atherosclerosis    Glaucoma, right eye    History of nuclear stress test 04/15/2005   (in epic)  for near syncope--- Low risk normal no ischemia, ef 57%   Hx of adenomatous colonic polyps 05/27/2016   Internal hemorrhoids    Morton neuroma, left    OA (osteoarthritis)    wrist, elbow, thumb, knees   PVC's (premature ventricular contractions)    RBBB (right bundle branch block)    States heart rate has been as low as 28 and asyptomatic   Past Surgical History:   Procedure Laterality Date   COLONOSCOPY  05/2016   TA   ELBOW SURGERY Left 04/2017   EXCISION MORTON'S NEUROMA Left 10/23/2018   Procedure: EXCISION MORTON'S NEUROMA LEFT FOOT;  Surgeon: ARosemary Holms DPM;  [facility] [facility]  Service: Podiatry;  Laterality: Left;   HEMORRHOID BANDING  2018   INGUINAL HERNIA REPAIR Left 06/14/2017   Procedure: LEFT INGUINAL HERNIA REPAIR ERAS PATHWAY;  Surgeon: CErroll Luna MD;  [facility] [facility]  Service: General;  Laterality: Left;   INGUINAL HERNIA REPAIR Right 11-18-2009   dr wRedmond Pulling '@[facility]'$    INSERTION OF MESH Left 06/14/2017   Procedure: INSERTION OF MESH;  Surgeon: CErroll Luna MD;  [place] [facility]  Service: General;  Laterality: Left;   KNEE ARTHROSCOPY W/ MENISCAL REPAIR Right 09/2015   POLYPECTOMY     TONSILLECTOMY AND ADENOIDECTOMY  child    Current Outpatient Medications  Medication Sig Dispense Refill   acetaminophen (TYLENOL) 500 MG tablet Take 1,000 mg by mouth 3 (three) times daily as needed for moderate pain.     aspirin EC 81 MG tablet Take 1 tablet (81 mg total) by mouth daily. Swallow whole.     atorvastatin (LIPITOR) 40 MG tablet Take 1 tablet (40 mg total) by mouth daily. 90 tablet 3   Cholecalciferol (VITAMIN D3) 1000  units CAPS Take 1 capsule by mouth daily.     dorzolamide (TRUSOPT) 2 % ophthalmic solution Place 1 drop into the right eye daily.      latanoprost (XALATAN) 0.005 % ophthalmic solution Place 1 drop into the right eye at bedtime.      methocarbamol (ROBAXIN) 500 MG tablet Take 1,000 mg by mouth every 4 (four) hours as needed.     naproxen (NAPROSYN) 500 MG tablet SMARTSIG:1 Tablet(s) By Mouth Every 12 Hours PRN     flecainide (TAMBOCOR) 50 MG tablet TAKE 1 TABLET(50 MG) BY MOUTH TWICE DAILY (Patient not taking: Reported on 01/21/2022) 180 tablet 2   No current facility-administered medications for this visit.    Allergies  Allergen Reactions    Codeine    Oxycodone Nausea And Vomiting    Social History   Socioeconomic History   Marital status: Married    Spouse name: Not on file   Number of children: Not on file   Years of education: Not on file   Highest education level: Not on file  Occupational History   Not on file  Tobacco Use   Smoking status: Never   Smokeless tobacco: Never  Vaping Use   Vaping Use: Never used  Substance and Sexual Activity   Alcohol use: Not Currently   Drug use: No   Sexual activity: Not on file  Other Topics Concern   Not on file  Social History Narrative   He is married and retired has a second home in the [place] area \   Does drink alcohol no smoking no tobacco no drug use   Social Determinants of Radio broadcast assistant Strain: Not on file  Food Insecurity: Not on file  Transportation Needs: Not on file  Physical Activity: Not on file  Stress: Not on file  Social Connections: Not on file  Intimate Partner Violence: Not on file     Review of Systems: All other systems reviewed and are otherwise negative except as noted above.  Physical Exam: Vitals:   01/21/22 0846  BP: 104/62  SpO2: 97%  Weight: 187 lb 9.6 oz (85.1 kg)  Height: '6\' 2"'$  (1.88 m)    GEN- The patient is well appearing, alert and oriented x 3 today.   HEENT: normocephalic, atraumatic; sclera clear, conjunctiva pink; hearing intact; oropharynx clear; neck supple, no JVP Lymph- no cervical lymphadenopathy Lungs- Clear to ausculation bilaterally, normal work of breathing.  No wheezes, rales, rhonchi Heart-  Somewhat irregular  rate and rhythm due to ectop, no murmurs, rubs or gallops, PMI not laterally displaced GI- soft, non-tender, non-distended, bowel sounds present, no hepatosplenomegaly Extremities- Trace peripheral edema. no clubbing or cyanosis; DP/PT/radial pulses 2+ bilaterally MS- no significant deformity or atrophy Skin- Ecchymosis on R neck, face, and flank Psych- euthymic mood, full  affect Neuro- strength and sensation are intact  EKG is ordered. Personal review of EKG from today shows NSR at 65 bpm, PVCs in bigeminy. QRS ~130 which is near baseline  Additional studies reviewed include: Previous EP office notes.   Assessment and Plan:  1. SSS Has been asymptomatic at rest, with HR increased on treadmill HRs currently get up into 110s with exertion, currently off flecainide.  Follow closely with resumptions.   2. PVCs Bigeminal on EKG today He has tolerated flecainide very well under close monitoring. Given no history of symptomatic bradycardia, and possibility that bradycardia would have been aggravated given his fall and pain; In shared decision making, we  will restart cautiously with close, 2 week follow up for EKG, then have wear monitor.  Reviewed paperwork from recent admission, and do not see any reported HR extremes, only "bradycardia"  3. Traumatic Fall Healing well, but still with some discomfort.  Back up to walking most days, understands that it may be months before he is back to previous baseline.   4. DOE Mixed picture PCP has raised question of possibility of R>L shunt.  This has not been demonstrated on prior work up, Discussed with Dr. Curt Bears and Dr. Audie Box   Follow up with EP APP in 2 weeks  Shirley Friar, [name]  01/21/22 9:17 AM

## 2022-01-19 ENCOUNTER — Telehealth: Payer: Self-pay | Admitting: Orthopaedic Surgery

## 2022-01-19 NOTE — Telephone Encounter (Signed)
Pt called requesting a call back from Dr. Durward Fortes. Pt last visit was 07/2020. Pt states he has broken some ribs and seen his PCP. Pt states he tried to send Dr Durward Fortes a mychart message to see if he needs to see him about his fractured ribs. Pt is asking for a call back at [phone].

## 2022-01-21 ENCOUNTER — Ambulatory Visit: Payer: PPO | Attending: Student | Admitting: Student

## 2022-01-21 ENCOUNTER — Encounter: Payer: Self-pay | Admitting: Student

## 2022-01-21 VITALS — BP 104/62 | Ht 74.0 in | Wt 187.6 lb

## 2022-01-21 DIAGNOSIS — I493 Ventricular premature depolarization: Secondary | ICD-10-CM | POA: Diagnosis not present

## 2022-01-21 DIAGNOSIS — I495 Sick sinus syndrome: Secondary | ICD-10-CM | POA: Diagnosis not present

## 2022-01-21 MED ORDER — FLECAINIDE ACETATE 50 MG PO TABS: ORAL_TABLET | ORAL | 3 refills | Status: DC

## 2022-01-21 NOTE — Patient Instructions (Signed)
Medication Instructions:  Your physician has recommended you make the following change in your medication:   Restart: Flecainide '50mg'$  twice daily  *If you need a refill on your cardiac medications before your next appointment, please call your pharmacy*   Lab Work: None  If you have labs (blood work) drawn today and your tests are completely normal, you will receive your results only by: [place] (if you have MyChart) OR A paper copy in the mail If you have any lab test that is abnormal or we need to change your treatment, we will call you to review the results.   Follow-Up: At [facility], you and your health needs are our priority.  As part of our continuing mission to provide you with exceptional heart care, we have created designated Provider Care Teams.  These Care Teams include your primary Cardiologist (physician) and Advanced Practice Providers (APPs -  Physician Assistants and Nurse Practitioners) who all work together to provide you with the care you need, when you need it.   Your next appointment:   As scheduled   Important Information About Sugar

## 2022-01-25 NOTE — Progress Notes (Signed)
PCP:  Ginger Organ., MD Primary Cardiologist: Early Osmond, MD Electrophysiologist: Constance Haw, MD   Andrew Payne is a 76 y.o. male seen today for Andrew Meredith Leeds, MD for routine electrophysiology followup. Since last being seen in our clinic the patient reports doing OK. He didn't particularly notice PVCs before, and feels a little better than last visit, this also in the setting of continued healing from his fall/rib fractures.  He has displaced left rib fractures and persistent pneumothorax of ~15%. He follows with ortho and is pending TCTS consult. Breathing is OK, again confounded by discomfort from ribs. No falls or syncope.   Past Medical History:  Diagnosis Date   Bradycardia    cardiology-- dr Curt Bears--  work-up done included event monitor and echo (all in epic)   Carotid artery stenosis    per pt H&P dated 10-20-2018 --  <40%   Coronary atherosclerosis    Glaucoma, right eye    History of nuclear stress test 04/15/2005   (in epic)  for near syncope--- Low risk normal no ischemia, ef 57%   Hx of adenomatous colonic polyps 05/27/2016   Internal hemorrhoids    Morton neuroma, left    OA (osteoarthritis)    wrist, elbow, thumb, knees   PVC's (premature ventricular contractions)    RBBB (right bundle branch block)    States heart rate has been as low as 28 and asyptomatic   Past Surgical History:  Procedure Laterality Date   COLONOSCOPY  05/2016   TA   ELBOW SURGERY Left 04/2017   EXCISION MORTON'S NEUROMA Left 10/23/2018   Procedure: EXCISION MORTON'S NEUROMA LEFT FOOT;  Surgeon: Rosemary Holms, DPM;  Location: [facility];  Service: Podiatry;  Laterality: Left;   HEMORRHOID BANDING  2018   INGUINAL HERNIA REPAIR Left 06/14/2017   Procedure: LEFT INGUINAL HERNIA REPAIR ERAS PATHWAY;  Surgeon: Erroll Luna, MD;  Location: [facility];  Service: General;  Laterality: Left;   INGUINAL HERNIA REPAIR Right 11-18-2009   dr  Redmond Pulling  '@[facility]'$    INSERTION OF MESH Left 06/14/2017   Procedure: INSERTION OF MESH;  Surgeon: Erroll Luna, MD;  Location: [facility];  Service: General;  Laterality: Left;   KNEE ARTHROSCOPY W/ MENISCAL REPAIR Right 09/2015   POLYPECTOMY     TONSILLECTOMY AND ADENOIDECTOMY  child    Current Outpatient Medications  Medication Sig Dispense Refill   acetaminophen (TYLENOL) 500 MG tablet Take 1,000 mg by mouth 3 (three) times daily as needed for moderate pain.     aspirin EC 81 MG tablet Take 1 tablet (81 mg total) by mouth daily. Swallow whole.     atorvastatin (LIPITOR) 40 MG tablet Take 1 tablet (40 mg total) by mouth daily. 90 tablet 3   Cholecalciferol (VITAMIN D3) 1000 units CAPS Take 1 capsule by mouth daily.     dorzolamide (TRUSOPT) 2 % ophthalmic solution Place 1 drop into the right eye daily.      flecainide (TAMBOCOR) 50 MG tablet TAKE 1 TABLET(50 MG) BY MOUTH TWICE DAILY 180 tablet 3   latanoprost (XALATAN) 0.005 % ophthalmic solution Place 1 drop into the right eye at bedtime.      methocarbamol (ROBAXIN) 500 MG tablet Take 1,000 mg by mouth every 4 (four) hours as needed.     No current facility-administered medications for this visit.    Allergies  Allergen Reactions   Codeine    Oxycodone Nausea And Vomiting  Social History   Socioeconomic History   Marital status: Married    Spouse name: Not on file   Number of children: Not on file   Years of education: Not on file   Highest education level: Not on file  Occupational History   Not on file  Tobacco Use   Smoking status: Never   Smokeless tobacco: Never  Vaping Use   Vaping Use: Never used  Substance and Sexual Activity   Alcohol use: Not Currently   Drug use: No   Sexual activity: Not on file  Other Topics Concern   Not on file  Social History Narrative   He is married and retired has a second home in the [place] area \   Does drink alcohol no smoking no tobacco no drug use    Social Determinants of Radio broadcast assistant Strain: Not on file  Food Insecurity: Not on file  Transportation Needs: Not on file  Physical Activity: Not on file  Stress: Not on file  Social Connections: Not on file  Intimate Partner Violence: Not on file     Review of Systems: All other systems reviewed and are otherwise negative except as noted above.  Physical Exam: Vitals:   02/02/22 0901  BP: 108/70  Pulse: (!) 51  SpO2: 95%  Weight: 179 lb 12.8 oz (81.6 kg)  Height: '6\' 2"'$  (1.88 m)    GEN- The patient is well appearing, alert and oriented x 3 today.   HEENT: normocephalic, atraumatic; sclera clear, conjunctiva pink; hearing intact; oropharynx clear; neck supple, no JVP Lymph- no cervical lymphadenopathy Lungs- Clear to ausculation bilaterally, normal work of breathing.  No wheezes, rales, rhonchi Heart- Regular rate and rhythm, no murmurs, rubs or gallops, PMI not laterally displaced GI- soft, non-tender, non-distended, bowel sounds present, no hepatosplenomegaly Extremities- No peripheral edema. no clubbing or cyanosis; DP/PT/radial pulses 2+ bilaterally MS- no significant deformity or atrophy Skin- warm and dry, no rash or lesion Psych- euthymic mood, full affect Neuro- strength and sensation are intact  EKG is ordered. Personal review of EKG from today shows sinus brady without PVCs.   It appears PR interval was UNDERread on EKG 9/28 and slightly over read today.  Appears closer to 140 on 9/28 and 160 today. EKG today is also stable from previous readings on flecainide.   Additional studies reviewed include: Previous EP office notes.   Assessment and Plan:  1. SSS Has been asymptomatic at rest, with HR increased on treadmill HRs currently get up into 110s with exertion EKG today shows stable rhythm and no PVCs back on flecainide Follow closely with resumptions.    2. PVCs Continue flecainide 50 mg BID  EKG as above.  Reviewed paperwork from  recent admission, and do not see any reported HR extremes, only "bradycardia" Non obstructive CAD noted on CT coronary 11/2021. Follow closely.    3. Traumatic Fall Continues with displaced left rib fractures, also with ~15% pneumothorax Pending follow up with thoracic surgery re: ptx.    4. DOE Mixed picture, likely exacerbated by rib fractures and pneumothorax.  Oxygenating OK.  PCP has raised question of possibility of R>L shunt.  This has not been demonstrated on prior work up, Discussed with Dr. Curt Bears and Dr. Audie Box. Andrew plan for limited echo with agitated saline/bubble study to assess once ribs are healed.   Follow up with Dr. Curt Bears in 4 weeks  Shirley Friar, [name]  02/02/22 9:11 AM

## 2022-01-26 DIAGNOSIS — L570 Actinic keratosis: Secondary | ICD-10-CM | POA: Diagnosis not present

## 2022-01-26 DIAGNOSIS — L821 Other seborrheic keratosis: Secondary | ICD-10-CM | POA: Diagnosis not present

## 2022-01-26 DIAGNOSIS — Z85828 Personal history of other malignant neoplasm of skin: Secondary | ICD-10-CM | POA: Diagnosis not present

## 2022-01-26 DIAGNOSIS — L738 Other specified follicular disorders: Secondary | ICD-10-CM | POA: Diagnosis not present

## 2022-01-26 DIAGNOSIS — D225 Melanocytic nevi of trunk: Secondary | ICD-10-CM | POA: Diagnosis not present

## 2022-01-27 ENCOUNTER — Ambulatory Visit: Payer: PPO | Admitting: Orthopaedic Surgery

## 2022-01-27 ENCOUNTER — Encounter: Payer: Self-pay | Admitting: Orthopaedic Surgery

## 2022-01-27 ENCOUNTER — Ambulatory Visit (INDEPENDENT_AMBULATORY_CARE_PROVIDER_SITE_OTHER): Payer: PPO

## 2022-01-27 DIAGNOSIS — R0781 Pleurodynia: Secondary | ICD-10-CM

## 2022-01-27 DIAGNOSIS — S2243XA Multiple fractures of ribs, bilateral, initial encounter for closed fracture: Secondary | ICD-10-CM | POA: Diagnosis not present

## 2022-01-27 DIAGNOSIS — J939 Pneumothorax, unspecified: Secondary | ICD-10-CM | POA: Insufficient documentation

## 2022-01-27 DIAGNOSIS — S270XXA Traumatic pneumothorax, initial encounter: Secondary | ICD-10-CM

## 2022-01-27 DIAGNOSIS — S2249XA Multiple fractures of ribs, unspecified side, initial encounter for closed fracture: Secondary | ICD-10-CM | POA: Insufficient documentation

## 2022-01-27 NOTE — Progress Notes (Unsigned)
Office Visit Note   Patient: Andrew Payne           Date of Birth: 12-Mar-1946           MRN: [id number] Visit Date: 01/27/2022              Requested by: Ginger Organ., MD [address] [place],  [place] [address] PCP: Ginger Organ., MD   Assessment & Plan: Visit Diagnoses:  1. Rib pain on left side   2. Closed fracture of multiple ribs of both sides, initial encounter   3. Traumatic pneumothorax, initial encounter     Plan: Mr. Dirocco sustained about a 15 foot fall 3 [place] ago [place].  He does not remember the details of the fall as he did lose consciousness and apparently had a concussion.  He was transported to [facility] where he was admitted for several days to the SICU with multiple rib fractures on both sides and a small pneumothorax.  He is now home and being followed by his primary care physician.  He apparently has not had any cardiac issues.  He does have resolving ecchymosis.  He was on aspirin 81 mg a day prior to the injury.  Still is having a little shortness of breath and there is evidence of a residual pneumothorax.  He also has been experiencing some pain about his left scapula and films demonstrate several of the displaced rib fracture is probably irritating the anterior aspect of the scapula.  I would hope that this would simply resolve over the time.  From the pneumothorax standpoint I think he needs to be seen by one of the chest physicians and we will make that referral all questions were answered.  Follow-Up Instructions: Return in about 1 month (around 02/27/2022).   Orders:  Orders Placed This Encounter  Procedures   XR Scapula Left   XR Ribs Unilateral Left   Ambulatory referral to Cardiothoracic Surgery   No orders of the defined types were placed in this encounter.     Procedures: No procedures performed   Clinical Data: No additional findings.   Subjective: Chief Complaint  Patient presents with    Chest - Pain, Injury  Mr. Placencia sustained a fall of about 15 feet 3 weeks ago [place].  He does not remember the details of the injury as he had loss of consciousness with subsequent concussion.  He was transported to [facility] as it was the closest facility and spent several days in the SICU with multiple rib fractures and small pneumothorax.  He is now home and up and about.  He still little short of breath and still having some pain with a deep breath or with a cough related to his rib fractures.  He is complaining of some pain along the left scapula with overhead motion numbness or tingling  HPI  Review of Systems   Objective: Vital Signs: There were no vitals taken for this visit.  Physical Exam Constitutional:      Appearance: He is well-developed.  Pulmonary:     Effort: Pulmonary effort is normal.  Skin:    General: Skin is warm and dry.  Neurological:     Mental Status: He is alert and oriented to person, place, and time.  Psychiatric:        Behavior: Behavior normal.     Ortho Exam awake alert and oriented x3.  Comfortable sitting.  He is not short of breath.  Resolving areas of ecchymosis in his face and the right side of his neck.  He can take a deep breath with just minimal discomfort but he does have some pain with overhead placement of his left arm and specifically about the scapula.  Skin intact.  Good grip and good release both hands.  No neck pain  Specialty Comments:  No specialty comments available.  Imaging: XR Scapula Left  Result Date: 01/27/2022 Films of the left scapula were obtained demonstrating no evidence of a fracture.  There are at least 3 rib fractures previously identified with displacement and no callus formation.  The sixth rib might be in close proximity to the anterior aspect of the scapula causing some discomfort with overhead motion.  Also identified is the pneumothorax as described with the rib films  XR Ribs  Unilateral Left  Result Date: 01/27/2022 Rib detail films were obtained on the left demonstrating at least 3 rib fractures including numbers 5 6 and 7.  There is some displacement.  Injury was 3 weeks ago but I do not see any callus is yet.  There also is an associated pneumothorax of at least 20% of the apex some fluid accumulation inferiorly where there is some blunting    PMFS History: Patient Active Problem List   Diagnosis Date Noted   Multiple rib fractures 01/27/2022   Pneumothorax 01/27/2022   DOE (dyspnea on exertion) 12/30/2021   Nontraumatic incomplete tear of left rotator cuff 03/13/2020   Unilateral primary osteoarthritis, right knee 11/01/2017   Chest pain 08/07/2017   Bradycardia 08/07/2017   History of right tennis elbow 05/09/2017   Right tennis elbow 04/07/2017   Prolapsed internal hemorrhoids, grade 3 06/03/2016   Hx of adenomatous colonic polyps 05/27/2016   Past Medical History:  Diagnosis Date   Bradycardia    cardiology-- dr Curt Bears--  work-up done included event monitor and echo (all in epic)   Carotid artery stenosis    per pt H&P dated 10-20-2018 --  <40%   Coronary atherosclerosis    Glaucoma, right eye    History of nuclear stress test 04/15/2005   (in epic)  for near syncope--- Low risk normal no ischemia, ef 57%   Hx of adenomatous colonic polyps 05/27/2016   Internal hemorrhoids    Morton neuroma, left    OA (osteoarthritis)    wrist, elbow, thumb, knees   PVC's (premature ventricular contractions)    RBBB (right bundle branch block)    States heart rate has been as low as 14 and asyptomatic    Family History  Problem Relation Age of Onset   Heart disease Mother    Alzheimer's disease Father    Colon polyps Father    Colon cancer Neg Hx    Pancreatic cancer Neg Hx    Prostate cancer Neg Hx    Rectal cancer Neg Hx    Esophageal cancer Neg Hx     Past Surgical History:  Procedure Laterality Date   COLONOSCOPY  05/2016   TA   ELBOW  SURGERY Left 04/2017   EXCISION MORTON'S NEUROMA Left 10/23/2018   Procedure: EXCISION MORTON'S NEUROMA LEFT FOOT;  Surgeon: Rosemary Holms, DPM;  Location: [place];  Service: Podiatry;  Laterality: Left;   HEMORRHOID BANDING  2018   INGUINAL HERNIA REPAIR Left 06/14/2017   Procedure: LEFT INGUINAL HERNIA REPAIR ERAS PATHWAY;  Surgeon: Erroll Luna, MD;  Location: [place];  Service: General;  Laterality: Left;   INGUINAL HERNIA REPAIR Right 11-18-2009   dr Redmond Pulling  '@[facility]'$    INSERTION OF MESH Left 06/14/2017   Procedure: INSERTION OF MESH;  Surgeon: Erroll Luna, MD;  Location: [place];  Service: General;  Laterality: Left;   KNEE ARTHROSCOPY W/ MENISCAL REPAIR Right 09/2015   POLYPECTOMY     TONSILLECTOMY AND ADENOIDECTOMY  child   Social History   Occupational History   Not on file  Tobacco Use   Smoking status: Never   Smokeless tobacco: Never  Vaping Use   Vaping Use: Never used  Substance and Sexual Activity   Alcohol use: Not Currently   Drug use: No   Sexual activity: Not on file     Garald Balding, MD   Note - This record has been created using Editor, commissioning.  Chart creation errors have been sought, but may not always  have been located. Such creation errors do not reflect on  the standard of medical care.

## 2022-01-27 NOTE — Progress Notes (Unsigned)
Office Visit Note   Patient: Andrew Payne           Date of Birth: 1945/12/21           MRN: [id number] Visit Date: 01/27/2022              Requested by: Ginger Organ., MD [address] [place],  [place] [address] PCP: Ginger Organ., MD   Assessment & Plan: Visit Diagnoses: No diagnosis found.  Plan: ***  Follow-Up Instructions: No follow-ups on file.   Orders:  No orders of the defined types were placed in this encounter.  No orders of the defined types were placed in this encounter.     Procedures: No procedures performed   Clinical Data: No additional findings.   Subjective: Chief Complaint  Patient presents with   Chest - Pain, Injury  Patient presents today for rib and left shoulder pain. He fell 83f from a deck around 3 weeks ago. He was told that he had bilateral rib fractures. He has been discussing his care with Dr.Whitfield through the phone. He states that he has pain below his left shoulder scapula. His wife states that it feels different from the other shoulder. He wanted to come in today for Dr.Whitfield to examine.   HPI  Review of Systems   Objective: Vital Signs: There were no vitals taken for this visit.  Physical Exam  Ortho Exam  Specialty Comments:  No specialty comments available.  Imaging: No results found.   PMFS History: Patient Active Problem List   Diagnosis Date Noted   DOE (dyspnea on exertion) 12/30/2021   Nontraumatic incomplete tear of left rotator cuff 03/13/2020   Unilateral primary osteoarthritis, right knee 11/01/2017   Chest pain 08/07/2017   Bradycardia 08/07/2017   History of right tennis elbow 05/09/2017   Right tennis elbow 04/07/2017   Prolapsed internal hemorrhoids, grade 3 06/03/2016   Hx of adenomatous colonic polyps 05/27/2016   Past Medical History:  Diagnosis Date   Bradycardia    cardiology-- dr cCurt Bears-  work-up done included event monitor and echo (all in epic)   Carotid  artery stenosis    per pt H&P dated 10-20-2018 --  <40%   Coronary atherosclerosis    Glaucoma, right eye    History of nuclear stress test 04/15/2005   (in epic)  for near syncope--- Low risk normal no ischemia, ef 57%   Hx of adenomatous colonic polyps 05/27/2016   Internal hemorrhoids    Morton neuroma, left    OA (osteoarthritis)    wrist, elbow, thumb, knees   PVC's (premature ventricular contractions)    RBBB (right bundle branch block)    States heart rate has been as low as 241and asyptomatic    Family History  Problem Relation Age of Onset   Heart disease Mother    Alzheimer's disease Father    Colon polyps Father    Colon cancer Neg Hx    Pancreatic cancer Neg Hx    Prostate cancer Neg Hx    Rectal cancer Neg Hx    Esophageal cancer Neg Hx     Past Surgical History:  Procedure Laterality Date   COLONOSCOPY  05/2016   TA   ELBOW SURGERY Left 04/2017   EXCISION MORTON'S NEUROMA Left 10/23/2018   Procedure: EXCISION MORTON'S NEUROMA LEFT FOOT;  Surgeon: ARosemary Holms DPM;  Location[place] [facility]  Service: Podiatry;  Laterality: Left;   HEMORRHOID BANDING  2018  INGUINAL HERNIA REPAIR Left 06/14/2017   Procedure: LEFT INGUINAL HERNIA REPAIR ERAS PATHWAY;  Surgeon: Erroll Luna, MD;  Location: [facility];  Service: General;  Laterality: Left;   INGUINAL HERNIA REPAIR Right 11-18-2009   dr Redmond Pulling  '@[facility]'$    INSERTION OF MESH Left 06/14/2017   Procedure: INSERTION OF MESH;  Surgeon: Erroll Luna, MD;  Location: [place];  Service: General;  Laterality: Left;   KNEE ARTHROSCOPY W/ MENISCAL REPAIR Right 09/2015   POLYPECTOMY     TONSILLECTOMY AND ADENOIDECTOMY  child   Social History   Occupational History   Not on file  Tobacco Use   Smoking status: Never   Smokeless tobacco: Never  Vaping Use   Vaping Use: Never used  Substance and Sexual Activity   Alcohol use: Not Currently   Drug use: No   Sexual activity:  Not on file

## 2022-02-02 ENCOUNTER — Ambulatory Visit: Payer: PPO | Attending: Student | Admitting: Student

## 2022-02-02 ENCOUNTER — Encounter: Payer: Self-pay | Admitting: Student

## 2022-02-02 ENCOUNTER — Ambulatory Visit: Payer: PPO | Admitting: Physician Assistant

## 2022-02-02 VITALS — BP 108/70 | HR 51 | Ht 74.0 in | Wt 179.8 lb

## 2022-02-02 DIAGNOSIS — I495 Sick sinus syndrome: Secondary | ICD-10-CM

## 2022-02-02 DIAGNOSIS — I493 Ventricular premature depolarization: Secondary | ICD-10-CM | POA: Diagnosis not present

## 2022-02-04 ENCOUNTER — Encounter: Payer: Self-pay | Admitting: Thoracic Surgery (Cardiothoracic Vascular Surgery)

## 2022-02-04 ENCOUNTER — Institutional Professional Consult (permissible substitution): Payer: PPO | Admitting: Thoracic Surgery (Cardiothoracic Vascular Surgery)

## 2022-02-04 VITALS — BP 124/84 | HR 57 | Resp 20 | Ht 74.0 in | Wt 181.1 lb

## 2022-02-04 DIAGNOSIS — S2243XA Multiple fractures of ribs, bilateral, initial encounter for closed fracture: Secondary | ICD-10-CM

## 2022-02-04 NOTE — Progress Notes (Signed)
[address]       [place] [address]             [id number]     HPI: Mr. Deloatch was seen for consultation regarding rib fractures and pneumothorax.  Jobani Sabado is a 76 year old man with a history of bradycardia, PVCs, coronary atherosclerosis, carotid atherosclerosis, glaucoma, hyperlipidemia, and arthritis.  About a month ago he fell off a deck a distance of about 15 feet before landing on the ground.  He was in [facility].  He was found to have a pneumothorax and multiple bilateral rib fractures with some displaced fractures on the left side.  He saw Dr. Durward Fortes who noted multiple rib fractures and pneumothorax on follow-up films done here.    He continues to have pain.  The pain on the right side has improved.  He still has pain on the left side.  The pain is not severe except when he moves in certain ways, such as when he bends over to tie his shoes he will experience a sharp pain under his shoulder blade.  He used to walk 5 to 6 miles a day.  Since his injuries only been able to walk about a mile at a time without getting short of breath.  Past Medical History:  Diagnosis Date   Bradycardia    cardiology-- dr Curt Bears--  work-up done included event monitor and echo (all in epic)   Carotid artery stenosis    per pt H&P dated 10-20-2018 --  <40%   Coronary atherosclerosis    Glaucoma, right eye    History of nuclear stress test 04/15/2005   (in epic)  for near syncope--- Low risk normal no ischemia, ef 57%   Hx of adenomatous colonic polyps 05/27/2016   Internal hemorrhoids    Morton neuroma, left    OA (osteoarthritis)    wrist, elbow, thumb, knees   PVC's (premature ventricular contractions)    RBBB (right bundle branch block)    States heart rate has been as low as 28 and asyptomatic    Current Outpatient Medications  Medication Sig Dispense Refill   acetaminophen (TYLENOL) 500 MG tablet Take 1,000 mg by mouth 3 (three) times daily as  needed for moderate pain.     aspirin EC 81 MG tablet Take 1 tablet (81 mg total) by mouth daily. Swallow whole.     atorvastatin (LIPITOR) 40 MG tablet Take 1 tablet (40 mg total) by mouth daily. 90 tablet 3   Cholecalciferol (VITAMIN D3) 1000 units CAPS Take 1 capsule by mouth daily.     dorzolamide (TRUSOPT) 2 % ophthalmic solution Place 1 drop into the right eye daily.      flecainide (TAMBOCOR) 50 MG tablet TAKE 1 TABLET(50 MG) BY MOUTH TWICE DAILY 180 tablet 3   latanoprost (XALATAN) 0.005 % ophthalmic solution Place 1 drop into the right eye at bedtime.      methocarbamol (ROBAXIN) 500 MG tablet Take 1,000 mg by mouth every 4 (four) hours as needed.     No current facility-administered medications for this visit.    Physical Exam Vitals reviewed.  Constitutional:      General: He is not in acute distress.    Appearance: Normal appearance.  HENT:     Head: Normocephalic and atraumatic.  Eyes:     General: No scleral icterus.    Extraocular Movements: Extraocular movements intact.  Cardiovascular:     Rate and Rhythm: Regular rhythm.  Bradycardia present.     Heart sounds: Normal heart sounds. No murmur heard. Pulmonary:     Effort: Pulmonary effort is normal. No respiratory distress.     Breath sounds: Normal breath sounds. No wheezing.  Abdominal:     General: There is no distension.     Palpations: Abdomen is soft.  Musculoskeletal:        General: Tenderness (Left posterior chest wall near tip of scapula) present.  Skin:    General: Skin is warm and dry.  Neurological:     General: No focal deficit present.     Mental Status: He is alert and oriented to person, place, and time.     Cranial Nerves: No cranial nerve deficit.     Motor: No weakness.     Diagnostic Tests: I personally reviewed the chest x-ray and rib x-rays performed on 01/27/2022.  He has multiple rib fractures bilaterally with some displacement posterior fractures on the fifth, sixth and seventh ribs  on the left.  Nondisplaced right rib fractures.  Small apical pneumothorax.  Impression: Andrew Payne is a 76 year old man with a history of bradycardia, PVCs, coronary atherosclerosis, carotid atherosclerosis, glaucoma, hyperlipidemia, and arthritis.  About a month ago he fell landing on his back and suffered multiple bilateral rib fractures.  He had nondisplaced fractures on the right but on the left there are several fractures that are displaced.  He continues to have some pain in that area.  Also gets some shortness of breath with exertion.  He was noted to have a pneumothorax on his follow-up rib films.  Pneumothorax-secondary to trauma with rib fractures.  No indication for intervention.  Body should reabsorb that with time.  Rib fractures-unfortunately his fractures are not particularly suitable for rib plating.  They are posterior and he is about a month out from his injury.   I think there is a high likelihood that the plates would fail to hold.  I also do not think it would make a significant difference in his time to recovery at this point given that he is already a month out.  Also possibly could make things worse either through pain or infection although infection is uncommon with this type of procedure.  Would continue to treat pain as needed.   Avoid activities which cause discomfort. No heavy lifting until least 3 months from the time of injury.  Plan: Follow-up as scheduled with Dr. Durward Fortes I will be happy to see him again if I can be of assistance  Melrose Nakayama, MD [facility] [phone]

## 2022-02-10 ENCOUNTER — Encounter: Payer: Self-pay | Admitting: Orthopaedic Surgery

## 2022-02-11 ENCOUNTER — Encounter: Payer: Self-pay | Admitting: Orthopaedic Surgery

## 2022-02-11 ENCOUNTER — Ambulatory Visit (INDEPENDENT_AMBULATORY_CARE_PROVIDER_SITE_OTHER): Payer: PPO

## 2022-02-11 ENCOUNTER — Ambulatory Visit: Payer: PPO | Admitting: Orthopaedic Surgery

## 2022-02-11 DIAGNOSIS — M7542 Impingement syndrome of left shoulder: Secondary | ICD-10-CM | POA: Diagnosis not present

## 2022-02-11 DIAGNOSIS — M542 Cervicalgia: Secondary | ICD-10-CM | POA: Diagnosis not present

## 2022-02-11 MED ORDER — BUPIVACAINE HCL 0.25 % IJ SOLN
2.0000 mL | INTRAMUSCULAR | Status: AC | PRN
  Administered 2022-02-11: 2 mL via INTRA_ARTICULAR

## 2022-02-11 MED ORDER — LIDOCAINE HCL 2 % IJ SOLN
2.0000 mL | INTRAMUSCULAR | Status: AC | PRN
  Administered 2022-02-11: 2 mL

## 2022-02-11 MED ORDER — METHYLPREDNISOLONE ACETATE 40 MG/ML IJ SUSP
80.0000 mg | INTRAMUSCULAR | Status: AC | PRN
  Administered 2022-02-11: 80 mg via INTRA_ARTICULAR

## 2022-02-11 NOTE — Progress Notes (Signed)
Office Visit Note   Patient: Andrew Payne           Date of Birth: Feb 03, 1946           MRN: [id number] Visit Date: 02/11/2022              Requested by: Ginger Organ., MD [address] [place],  [place] [address] PCP: Ginger Organ., MD   Assessment & Plan: Visit Diagnoses:  1. Neck pain   2. Impingement syndrome of left shoulder     Plan: Approximately 5 weeks status post injury as previously outlined when he fell off his porch [place].  He fractured a number of ribs in both the right and left side and was hospitalized in [facility] in the intensive care unit for several days.  He did sustain a concussion and has little memory of the surrounding events.  When he was last here he was noted to have a pneumothorax somewhere around 30%.  He has been seen by the chest surgeons who felt like that that would probably reinflate over a period of time.  He still has a little shortness of breath when he walks for over a mile.  He also has had some displaced ribs on the left side and I think that there is been some irritation of the scapula.  However over the  Past several days he has developed some anterior and lateral left shoulder pain.  This is the same shoulder he had rotator cuff surgery several years ago.  Evidence of impingement.  I believe he is having some irritation of the scapula from the fractured ribs but I do not think that this present pain in the subacromial region is related to that.  He could have another rotator cuff tear could simply have an irritation of the rotator cuff.  I am going to inject the subacromial space with Marcaine Xylocaine and Depo-Medrol and monitor his response over the next couple weeks.  If no improvement I would suggest an MRI scan of the shoulder.  He was having some discomfort along the left side of his neck with some referred pain to the shoulder with motion of his neck and x-rays did  demonstrate some degenerative change particularly in the mid to lower cervical spine but I am not sure that this is what is causing his present shoulder pain Follow-Up Instructions: Return if symptoms worsen or fail to improve.   Orders:  Orders Placed This Encounter  Procedures   Large Joint Inj: L subacromial bursa   XR Cervical Spine 2 or 3 views   No orders of the defined types were placed in this encounter.     Procedures: Large Joint Inj: L subacromial bursa on 02/11/2022 3:13 PM Indications: pain and diagnostic evaluation Details: 25 G 1.5 in needle, anterolateral approach  Arthrogram: No  Medications: 2 mL lidocaine 2 %; 80 mg methylPREDNISolone acetate 40 MG/ML; 2 mL bupivacaine 0.25 % Consent was given by the patient. Immediately prior to procedure a time out was called to verify the correct patient, procedure, equipment, support staff and site/side marked as required. Patient was prepped and draped in the usual sterile fashion.       Clinical Data: No additional findings.   Subjective: Chief Complaint  Patient presents with   Left Shoulder - Pain  Patient presents today for left shoulder pain. He said that he has been experiencing pain in his  upper arm and superior left shoulder. He does not have pain constantly, but more with movements. He is not taking anything for pain. He is right hand dominant.   HPI  Review of Systems   Objective: Vital Signs: There were no vitals taken for this visit.  Physical Exam Constitutional:      Appearance: He is well-developed.  Eyes:     Pupils: Pupils are equal, round, and reactive to light.  Pulmonary:     Effort: Pulmonary effort is normal.  Skin:    General: Skin is warm and dry.  Neurological:     Mental Status: He is alert and oriented to person, place, and time.  Psychiatric:        Behavior: Behavior normal.     Ortho Exam awake alert and oriented x3.  Comfortable sitting.  Positive impingement with  internal/external rotation of the left shoulder and even with abduction.  Pain with overhead motion.  There was some specific pain behind the scapula related to the rib fractures but there is a specific difference root with the impingement testing.  No crepitation.  Appears to have good strength.  Neurologically intact  Specialty Comments:  No specialty comments available.  Imaging: XR Cervical Spine 2 or 3 views  Result Date: 02/11/2022 Films of the cervical spine obtained in 2 projections.  There is very minimal straightening of the normal cervical lordosis.  There are degenerative changes beginning at C5 extending to C7 with narrowing of the disc spaces and some anterior osteophytes.  Some mild facet sclerosis at the same levels.  No listhesis or scoliosis    PMFS History: Patient Active Problem List   Diagnosis Date Noted   Impingement syndrome of left shoulder 02/11/2022   Multiple rib fractures 01/27/2022   Pneumothorax 01/27/2022   DOE (dyspnea on exertion) 12/30/2021   Nontraumatic incomplete tear of left rotator cuff 03/13/2020   Unilateral primary osteoarthritis, right knee 11/01/2017   Chest pain 08/07/2017   Bradycardia 08/07/2017   History of right tennis elbow 05/09/2017   Right tennis elbow 04/07/2017   Prolapsed internal hemorrhoids, grade 3 06/03/2016   Hx of adenomatous colonic polyps 05/27/2016   Past Medical History:  Diagnosis Date   Bradycardia    cardiology-- dr Curt Bears--  work-up done included event monitor and echo (all in epic)   Carotid artery stenosis    per pt H&P dated 10-20-2018 --  <40%   Coronary atherosclerosis    Glaucoma, right eye    History of nuclear stress test 04/15/2005   (in epic)  for near syncope--- Low risk normal no ischemia, ef 57%   Hx of adenomatous colonic polyps 05/27/2016   Internal hemorrhoids    Morton neuroma, left    OA (osteoarthritis)    wrist, elbow, thumb, knees   PVC's (premature ventricular contractions)    RBBB  (right bundle branch block)    States heart rate has been as low as 26 and asyptomatic    Family History  Problem Relation Age of Onset   Heart disease Mother    Alzheimer's disease Father    Colon polyps Father    Colon cancer Neg Hx    Pancreatic cancer Neg Hx    Prostate cancer Neg Hx    Rectal cancer Neg Hx    Esophageal cancer Neg Hx     Past Surgical History:  Procedure Laterality Date   COLONOSCOPY  05/2016   TA   ELBOW SURGERY Left 04/2017   EXCISION MORTON'S  NEUROMA Left 10/23/2018   Procedure: EXCISION MORTON'S NEUROMA LEFT FOOT;  Surgeon: Rosemary Holms, DPM;  Location: [place] [facility];  Service: Podiatry;  Laterality: Left;   HEMORRHOID BANDING  2018   INGUINAL HERNIA REPAIR Left 06/14/2017   Procedure: LEFT INGUINAL HERNIA REPAIR ERAS PATHWAY;  Surgeon: Erroll Luna, MD;  Location: [facility];  Service: General;  Laterality: Left;   INGUINAL HERNIA REPAIR Right 11-18-2009   dr Redmond Pulling  '@[facility]'$    INSERTION OF MESH Left 06/14/2017   Procedure: INSERTION OF MESH;  Surgeon: Erroll Luna, MD;  Location: [place];  Service: General;  Laterality: Left;   KNEE ARTHROSCOPY W/ MENISCAL REPAIR Right 09/2015   POLYPECTOMY     TONSILLECTOMY AND ADENOIDECTOMY  child   Social History   Occupational History   Not on file  Tobacco Use   Smoking status: Never   Smokeless tobacco: Never  Vaping Use   Vaping Use: Never used  Substance and Sexual Activity   Alcohol use: Not Currently   Drug use: No   Sexual activity: Not on file

## 2022-02-13 DIAGNOSIS — Z23 Encounter for immunization: Secondary | ICD-10-CM | POA: Diagnosis not present

## 2022-02-18 ENCOUNTER — Encounter: Payer: Self-pay | Admitting: Orthopaedic Surgery

## 2022-02-18 DIAGNOSIS — M7542 Impingement syndrome of left shoulder: Secondary | ICD-10-CM

## 2022-02-18 NOTE — Telephone Encounter (Signed)
Mri ordered 

## 2022-02-24 ENCOUNTER — Encounter: Payer: Self-pay | Admitting: Orthopaedic Surgery

## 2022-03-03 ENCOUNTER — Ambulatory Visit
Admission: RE | Admit: 2022-03-03 | Discharge: 2022-03-03 | Disposition: A | Discharge status: Home / Self Care | Payer: PPO | Source: Ambulatory Visit | Attending: Orthopaedic Surgery | Admitting: Orthopaedic Surgery

## 2022-03-03 ENCOUNTER — Ambulatory Visit: Payer: PPO | Attending: Cardiology | Admitting: Cardiology

## 2022-03-03 ENCOUNTER — Encounter: Payer: Self-pay | Admitting: Cardiology

## 2022-03-03 VITALS — BP 110/64 | HR 49 | Ht 74.0 in | Wt 182.6 lb

## 2022-03-03 DIAGNOSIS — M7542 Impingement syndrome of left shoulder: Secondary | ICD-10-CM

## 2022-03-03 DIAGNOSIS — M19012 Primary osteoarthritis, left shoulder: Secondary | ICD-10-CM | POA: Diagnosis not present

## 2022-03-03 DIAGNOSIS — R0602 Shortness of breath: Secondary | ICD-10-CM

## 2022-03-03 DIAGNOSIS — I493 Ventricular premature depolarization: Secondary | ICD-10-CM | POA: Diagnosis not present

## 2022-03-03 DIAGNOSIS — I495 Sick sinus syndrome: Secondary | ICD-10-CM | POA: Diagnosis not present

## 2022-03-03 NOTE — Patient Instructions (Signed)
Medication Instructions:  Your physician recommends that you continue on your current medications as directed. Please refer to the Current Medication list given to you today.  *If you need a refill on your cardiac medications before your next appointment, please call your pharmacy*   Lab Work: None ordered If you have labs (blood work) drawn today and your tests are completely normal, you will receive your results only by: [place] (if you have MyChart) OR A paper copy in the mail If you have any lab test that is abnormal or we need to change your treatment, we will call you to review the results.   Testing/Procedures: Your physician has requested that you have an echocardiogram. Echocardiography is a painless test that uses sound waves to create images of your heart. It provides your doctor with information about the size and shape of your heart and how well your heart's chambers and valves are working. This procedure takes approximately one hour. There are no restrictions for this procedure. Please do NOT wear cologne, perfume, aftershave, or lotions (deodorant is allowed). Please arrive 15 minutes prior to your appointment time.    Follow-Up: At [facility], you and your health needs are our priority.  As part of our continuing mission to provide you with exceptional heart care, we have created designated Provider Care Teams.  These Care Teams include your primary Cardiologist (physician) and Advanced Practice Providers (APPs -  Physician Assistants and Nurse Practitioners) who all work together to provide you with the care you need, when you need it.  Your next appointment:   6 month(s)  The format for your next appointment:   In Person  Provider:   Legrand Como "Jonni Sanger" Chalmers Cater, [name]    Thank you for choosing [facility]!!   Trinidad Curet, RN [phone]  Other Instructions    Important Information About Sugar

## 2022-03-03 NOTE — Progress Notes (Signed)
Electrophysiology Office Note   Date:  03/03/2022   ID:  Andrew Payne, DOB 11/09/1945, MRN [id number]  PCP:  Ginger Organ., MD  Cardiologist:   Primary Electrophysiologist:  Ariyon Gerstenberger Meredith Leeds, MD    No chief complaint on file.     History of Present Illness: Andrew Payne is a 76 y.o. male who is being seen today for the evaluation of bradycardia at the request of Ladell Pier. Presenting today for electrophysiology evaluation.    He has a history significant for sick sinus syndrome, mild carotid stenosis, PVCs.  He had a planned foot surgery 2 years prior where and he had an ECG showing sinus rhythm in the 40s.  He has a right bundle branch block.  He was noted to have heart rates in the 30s with junctional escape.  He went to the emergency room and was no longer have P waves on his 3-lead ECG.  Twelve-lead ECG showed junctional rhythm with frequent PVCs.  He was able to do all of his daily activities and thus he had no pacemaker implanted.  He wore a cardiac monitor that showed a 4.3% PVC burden.  He is on flecainide.  Exercise treadmill test showed that he was able to increase his heart rate appropriately.  Today, denies symptoms of palpitations, chest pain, shortness of breath, orthopnea, PND, lower extremity edema, claudication, dizziness, presyncope, syncope, bleeding, or neurologic sequela. The patient is tolerating medications without difficulties.  Over the summer, he had a severe fall.  He fell 14 feet off a deck.  He had displaced rib fractures and pneumothorax concussion.  We presented to the hospital, he was noted to have PVCs and his flecainide was stopped.  His since been restarted.  He is also injured his shoulder and is planning for MRI today.  He had been short of breath prior to the fall, but this has been better.  He is followed up with pulmonary who found no cause of his shortness of breath.  Fortunately he is feeling well today.   Past Medical History:   Diagnosis Date   Bradycardia    cardiology-- dr Curt Bears--  work-up done included event monitor and echo (all in epic)   Carotid artery stenosis    per pt H&P dated 10-20-2018 --  <40%   Coronary atherosclerosis    Glaucoma, right eye    History of nuclear stress test 04/15/2005   (in epic)  for near syncope--- Low risk normal no ischemia, ef 57%   Hx of adenomatous colonic polyps 05/27/2016   Internal hemorrhoids    Morton neuroma, left    OA (osteoarthritis)    wrist, elbow, thumb, knees   PVC's (premature ventricular contractions)    RBBB (right bundle branch block)    States heart rate has been as low as 28 and asyptomatic   Past Surgical History:  Procedure Laterality Date   COLONOSCOPY  05/2016   TA   ELBOW SURGERY Left 04/2017   EXCISION MORTON'S NEUROMA Left 10/23/2018   Procedure: EXCISION MORTON'S NEUROMA LEFT FOOT;  Surgeon: Rosemary Holms, DPM;  Location: [place];  Service: Podiatry;  Laterality: Left;   HEMORRHOID BANDING  2018   INGUINAL HERNIA REPAIR Left 06/14/2017   Procedure: LEFT INGUINAL HERNIA REPAIR ERAS PATHWAY;  Surgeon: Erroll Luna, MD;  Location: [place];  Service: General;  Laterality: Left;   INGUINAL HERNIA REPAIR Right 11-18-2009   dr Redmond Pulling  '@[facility]'$    INSERTION OF  MESH Left 06/14/2017   Procedure: INSERTION OF MESH;  Surgeon: Erroll Luna, MD;  Location: [place];  Service: General;  Laterality: Left;   KNEE ARTHROSCOPY W/ MENISCAL REPAIR Right 09/2015   POLYPECTOMY     TONSILLECTOMY AND ADENOIDECTOMY  child     Current Outpatient Medications  Medication Sig Dispense Refill   acetaminophen (TYLENOL) 500 MG tablet Take 1,000 mg by mouth 3 (three) times daily as needed for moderate pain.     aspirin EC 81 MG tablet Take 1 tablet (81 mg total) by mouth daily. Swallow whole.     atorvastatin (LIPITOR) 40 MG tablet Take 1 tablet (40 mg total) by mouth daily. 90 tablet 3   Cholecalciferol (VITAMIN  D3) 1000 units CAPS Take 1 capsule by mouth daily.     dorzolamide (TRUSOPT) 2 % ophthalmic solution Place 1 drop into the right eye daily.      flecainide (TAMBOCOR) 50 MG tablet TAKE 1 TABLET(50 MG) BY MOUTH TWICE DAILY 180 tablet 3   latanoprost (XALATAN) 0.005 % ophthalmic solution Place 1 drop into the right eye at bedtime.      methocarbamol (ROBAXIN) 500 MG tablet Take 1,000 mg by mouth every 4 (four) hours as needed.     No current facility-administered medications for this visit.    Allergies:   Codeine and Oxycodone   Social History:  The patient  reports that he has never smoked. He has never used smokeless tobacco. He reports that he does not currently use alcohol. He reports that he does not use drugs.   Family History:  The patient's family history includes Alzheimer's disease in his father; Colon polyps in his father; Heart disease in his mother.   ROS:  Please see the history of present illness.   Otherwise, review of systems is positive for none.   All other systems are reviewed and negative.   PHYSICAL EXAM: VS:  BP 110/64   Pulse (!) 49   Ht '6\' 2"'$  (1.88 m)   Wt 182 lb 9.6 oz (82.8 kg)   SpO2 99%   BMI 23.44 kg/m  , BMI Body mass index is 23.44 kg/m. GEN: Well nourished, well developed, in no acute distress  HEENT: normal  Neck: no JVD, carotid bruits, or masses Cardiac: RRR; no murmurs, rubs, or gallops,no edema  Respiratory:  clear to auscultation bilaterally, normal work of breathing GI: soft, nontender, nondistended, + BS MS: no deformity or atrophy  Skin: warm and dry Neuro:  Strength and sensation are intact Psych: euthymic mood, full affect  EKG:  EKG is not ordered today. Personal review of the ekg ordered 02/02/22 shows sinus rhythm, right bundle branch block, rate 51  Recent Labs: 01/04/2022: ALT 17; BUN 18; Creatinine, Ser 1.14; Potassium 4.6; Sodium 143    Lipid Panel     Component Value Date/Time   CHOL 119 01/04/2022 1001   TRIG 64  01/04/2022 1001   HDL 63 01/04/2022 1001   CHOLHDL 1.9 01/04/2022 1001   CHOLHDL 3.4 08/08/2017 0032   VLDL 12 08/08/2017 0032   LDLCALC 42 01/04/2022 1001     Wt Readings from Last 3 Encounters:  03/03/22 182 lb 9.6 oz (82.8 kg)  02/04/22 181 lb 2.2 oz (82.2 kg)  02/02/22 179 lb 12.8 oz (81.6 kg)      Other studies Reviewed: Additional studies/ records that were reviewed today include: Coronary CTA 08/09/2017 Review of the above records today demonstrates:  1. Left Main:  No significant  stenosis.   2. LAD: No significant stenosis. 3. LCX: No significant stenosis. 4. RCA: No significant stenosis.   IMPRESSION: 1.  CT FFR analysis didn't show any significant stenosis.  Cardiac monitor 08/06/2020 personally reviewed Max 156 bpm 03:11pm, 04/05 Min 30 bpm 05:37am, 04/08 Avg 48 bpm <1% supraventricular ectopy 4.3% ventricular ectopy Predominant rhythm was sinus rhythm No atrial fibrillation noted Symptoms associated with sinus rhythm and occasionally associated with ventricular bigeminy  TTE 09/01/2018  1. The left ventricle has normal systolic function, with an ejection  fraction of 55-60%. The cavity size was normal. Left ventricular diastolic  parameters were normal. No evidence of left ventricular regional wall  motion abnormalities.   2. The right ventricle has normal systolic function. The cavity was  normal. There is no increase in right ventricular wall thickness.   3. Left atrial size was mildly dilated.   4. Mild thickening of the mitral valve leaflet. There is mild mitral  annular calcification present.   5. The aortic valve is tricuspid. Mild thickening of the aortic valve.  Mild calcification of the aortic valve. Mild aortic annular calcification  noted.   6. The aortic root is normal in size and structure.   ASSESSMENT AND PLAN:  1.  Sick sinus syndrome: Continues to have resting bradycardia.  He was able to increase his heart rate on a treadmill.   Confirmed by exercise treadmill test.  No pacemaker implant at this time.  2.  PVCs: Burden of 4.3% on cardiac monitor.  On flecainide 50 mg twice daily.  High risk medication monitoring via ECG today.  At ETT that showed no QRS widening.  3.  Shortness of breath: Unclear as to the cause.  Fortunately he is feeling better.  It was brought out that he could have a shunt.  We Nadya Hopwood plan for echo with a bubble study.   Current medicines are reviewed at length with the patient today.   The patient does not have concerns regarding his medicines.  The following changes were made today: none  Labs/ tests ordered today include:  Orders Placed This Encounter  Procedures   ECHOCARDIOGRAM COMPLETE BUBBLE STUDY      Disposition:   FU 6 months  Signed, Kaydee Magel Meredith Leeds, MD  03/03/2022 10:06 AM     [facility] [address] [place] [address] [phone] (office) [phone] (fax)

## 2022-03-04 DIAGNOSIS — R0683 Snoring: Secondary | ICD-10-CM | POA: Diagnosis not present

## 2022-03-04 DIAGNOSIS — R490 Dysphonia: Secondary | ICD-10-CM | POA: Diagnosis not present

## 2022-03-04 DIAGNOSIS — S2243XS Multiple fractures of ribs, bilateral, sequela: Secondary | ICD-10-CM | POA: Diagnosis not present

## 2022-03-04 DIAGNOSIS — M25512 Pain in left shoulder: Secondary | ICD-10-CM | POA: Diagnosis not present

## 2022-03-04 DIAGNOSIS — J939 Pneumothorax, unspecified: Secondary | ICD-10-CM | POA: Diagnosis not present

## 2022-03-04 DIAGNOSIS — R0602 Shortness of breath: Secondary | ICD-10-CM | POA: Diagnosis not present

## 2022-03-07 ENCOUNTER — Other Ambulatory Visit: Payer: PPO

## 2022-03-09 ENCOUNTER — Ambulatory Visit (INDEPENDENT_AMBULATORY_CARE_PROVIDER_SITE_OTHER): Payer: PPO

## 2022-03-09 ENCOUNTER — Encounter: Payer: Self-pay | Admitting: Orthopaedic Surgery

## 2022-03-09 ENCOUNTER — Ambulatory Visit: Payer: PPO | Admitting: Orthopaedic Surgery

## 2022-03-09 DIAGNOSIS — M542 Cervicalgia: Secondary | ICD-10-CM

## 2022-03-09 DIAGNOSIS — M7542 Impingement syndrome of left shoulder: Secondary | ICD-10-CM | POA: Diagnosis not present

## 2022-03-09 DIAGNOSIS — R0781 Pleurodynia: Secondary | ICD-10-CM | POA: Diagnosis not present

## 2022-03-09 DIAGNOSIS — S2243XA Multiple fractures of ribs, bilateral, initial encounter for closed fracture: Secondary | ICD-10-CM | POA: Diagnosis not present

## 2022-03-09 DIAGNOSIS — H401111 Primary open-angle glaucoma, right eye, mild stage: Secondary | ICD-10-CM | POA: Diagnosis not present

## 2022-03-09 NOTE — Progress Notes (Signed)
Office Visit Note   Patient: Andrew Payne           Date of Birth: April 26, 1946           MRN: [id number] Visit Date: 03/09/2022              Requested by: Ginger Organ., MD [address] [place],  [place] [address] PCP: Ginger Organ., MD   Assessment & Plan: Visit Diagnoses:  1. Neck pain   2. Rib pain   3. Impingement syndrome of left shoulder   4. Closed fracture of multiple ribs of both sides, initial encounter     Plan: [name] is 2 months status post injury as previously outlined.  He was experiencing some left shoulder pain so I obtained an MRI scan.  This demonstrated mild distal supraspinatus and infraspinatus tendinosis with bursal sided fraying but no evidence of a rotator cuff tear.  He had prior rotator cuff surgery over a year ago.  Biceps long head was intact.  No significant joint effusion of the glenohumeral joint and mild chondrosis.  He is now localizing some of his discomfort along the left side of his neck but without radiculopathy and I think therapy would be worthwhile.  Fortunately there did not appear to be any significant pathology of the left shoulder.  Some of his discomfort might be originating from the rib fractures numbering 8 7 and 6 on the left side.  Will reevaluate in 1 month and start therapy for his neck as mentioned  Follow-Up Instructions: Return in about 1 month (around 04/08/2022).   Orders:  Orders Placed This Encounter  Procedures   XR Ribs Unilateral Left   Ambulatory referral to Physical Therapy   No orders of the defined types were placed in this encounter.     Procedures: No procedures performed   Clinical Data: No additional findings.   Subjective: Chief Complaint  Patient presents with   Neck - Follow-up   Left Shoulder - Follow-up  Evaluation of left shoulder pain.  Some of his discomfort may be of cervical spine origin.  Had an MRI scan of his shoulder here for the results.  Having less shortness of breath as  a result of his left pneumothorax and, perhaps less pain from the rib fractures  HPI  Review of Systems   Objective: Vital Signs: There were no vitals taken for this visit.  Physical Exam Constitutional:      Appearance: He is well-developed.  Eyes:     Pupils: Pupils are equal, round, and reactive to light.  Pulmonary:     Effort: Pulmonary effort is normal.  Skin:    General: Skin is warm and dry.  Neurological:     Mental Status: He is alert and oriented to person, place, and time.  Psychiatric:        Behavior: Behavior normal.     Ortho Exam awake alert and oriented x3.  Comfortable sitting.  Able to take a deep breath without too much pain.  There is still little bit of soreness posteriorly along the mid left rib cage.  He is able to raise his arm over his head but slowly as he seems to have little bit of catching as he raises at overhead and this might be related to his rib fractures beneath the scapula.  He has almost full range of motion of his cervical spine but with some discomfort when he rotates to the left causing some pain along  the levator scapular.  Good grip and release neurologically intact.  Specialty Comments:  No specialty comments available.  Imaging: XR Ribs Unilateral Left  Result Date: 03/09/2022 Detail films were obtained to reassess multiple rib fractures left chest wall.  Fractures are still identified of the eighth seventh and sixth ribs.  There is displacement of the seventh rib anterior to the scapula could be causing some of the symptoms    PMFS History: Patient Active Problem List   Diagnosis Date Noted   Impingement syndrome of left shoulder 02/11/2022   Multiple rib fractures 01/27/2022   Pneumothorax 01/27/2022   DOE (dyspnea on exertion) 12/30/2021   Nontraumatic incomplete tear of left rotator cuff 03/13/2020   Unilateral primary osteoarthritis, right knee 11/01/2017   Chest pain 08/07/2017   Bradycardia 08/07/2017   History of  right tennis elbow 05/09/2017   Right tennis elbow 04/07/2017   Prolapsed internal hemorrhoids, grade 3 06/03/2016   Hx of adenomatous colonic polyps 05/27/2016   Past Medical History:  Diagnosis Date   Bradycardia    cardiology-- dr Curt Bears--  work-up done included event monitor and echo (all in epic)   Carotid artery stenosis    per pt H&P dated 10-20-2018 --  <40%   Coronary atherosclerosis    Glaucoma, right eye    History of nuclear stress test 04/15/2005   (in epic)  for near syncope--- Low risk normal no ischemia, ef 57%   Hx of adenomatous colonic polyps 05/27/2016   Internal hemorrhoids    Morton neuroma, left    OA (osteoarthritis)    wrist, elbow, thumb, knees   PVC's (premature ventricular contractions)    RBBB (right bundle branch block)    States heart rate has been as low as 46 and asyptomatic    Family History  Problem Relation Age of Onset   Heart disease Mother    Alzheimer's disease Father    Colon polyps Father    Colon cancer Neg Hx    Pancreatic cancer Neg Hx    Prostate cancer Neg Hx    Rectal cancer Neg Hx    Esophageal cancer Neg Hx     Past Surgical History:  Procedure Laterality Date   COLONOSCOPY  05/2016   TA   ELBOW SURGERY Left 04/2017   EXCISION MORTON'S NEUROMA Left 10/23/2018   Procedure: EXCISION MORTON'S NEUROMA LEFT FOOT;  Surgeon: Rosemary Holms, DPM;  Location: [place];  Service: Podiatry;  Laterality: Left;   HEMORRHOID BANDING  2018   INGUINAL HERNIA REPAIR Left 06/14/2017   Procedure: LEFT INGUINAL HERNIA REPAIR ERAS PATHWAY;  Surgeon: Erroll Luna, MD;  Location: [place];  Service: General;  Laterality: Left;   INGUINAL HERNIA REPAIR Right 11-18-2009   dr Redmond Pulling  '@[facility]'$    INSERTION OF MESH Left 06/14/2017   Procedure: INSERTION OF MESH;  Surgeon: Erroll Luna, MD;  Location: [place];  Service: General;  Laterality: Left;   KNEE ARTHROSCOPY W/ MENISCAL REPAIR Right 09/2015    POLYPECTOMY     TONSILLECTOMY AND ADENOIDECTOMY  child   Social History   Occupational History   Not on file  Tobacco Use   Smoking status: Never   Smokeless tobacco: Never  Vaping Use   Vaping Use: Never used  Substance and Sexual Activity   Alcohol use: Not Currently   Drug use: No   Sexual activity: Not on file     Garald Balding, MD   Note - This record  has been created using [employer].  Chart creation errors have been sought, but may not always  have been located. Such creation errors do not reflect on  the standard of medical care.

## 2022-03-22 ENCOUNTER — Encounter: Payer: Self-pay | Admitting: Rehabilitative and Restorative Service Providers"

## 2022-03-22 ENCOUNTER — Ambulatory Visit (INDEPENDENT_AMBULATORY_CARE_PROVIDER_SITE_OTHER): Payer: PPO | Admitting: Rehabilitative and Restorative Service Providers"

## 2022-03-22 DIAGNOSIS — R293 Abnormal posture: Secondary | ICD-10-CM | POA: Diagnosis not present

## 2022-03-22 DIAGNOSIS — M5459 Other low back pain: Secondary | ICD-10-CM

## 2022-03-22 DIAGNOSIS — M542 Cervicalgia: Secondary | ICD-10-CM | POA: Diagnosis not present

## 2022-03-22 NOTE — Therapy (Signed)
OUTPATIENT PHYSICAL THERAPY CERVICAL EVALUATION   Patient Name: Andrew Payne MRN: [id number] DOB:1945-09-19, 76 y.o., male Today's Date: 03/22/2022  END OF SESSION:  PT End of Session - 03/22/22 1030     Visit Number 1    Number of Visits 10    Progress Note Due on Visit 10    PT Start Time 0933    PT Stop Time 1018    PT Time Calculation (min) 45 min    Activity Tolerance Patient tolerated treatment well;No increased pain;Patient limited by pain    Behavior During Therapy [facility] for tasks assessed/performed             Past Medical History:  Diagnosis Date   Bradycardia    cardiology-- dr Curt Bears--  work-up done included event monitor and echo (all in epic)   Carotid artery stenosis    per pt H&P dated 10-20-2018 --  <40%   Coronary atherosclerosis    Glaucoma, right eye    History of nuclear stress test 04/15/2005   (in epic)  for near syncope--- Low risk normal no ischemia, ef 57%   Hx of adenomatous colonic polyps 05/27/2016   Internal hemorrhoids    Morton neuroma, left    OA (osteoarthritis)    wrist, elbow, thumb, knees   PVC's (premature ventricular contractions)    RBBB (right bundle branch block)    States heart rate has been as low as 28 and asyptomatic   Past Surgical History:  Procedure Laterality Date   COLONOSCOPY  05/2016   TA   ELBOW SURGERY Left 04/2017   EXCISION MORTON'S NEUROMA Left 10/23/2018   Procedure: EXCISION MORTON'S NEUROMA LEFT FOOT;  Surgeon: Rosemary Holms, DPM;  Location: [place];  Service: Podiatry;  Laterality: Left;   HEMORRHOID BANDING  2018   INGUINAL HERNIA REPAIR Left 06/14/2017   Procedure: LEFT INGUINAL HERNIA REPAIR ERAS PATHWAY;  Surgeon: Erroll Luna, MD;  Location: [place];  Service: General;  Laterality: Left;   INGUINAL HERNIA REPAIR Right 11-18-2009   dr Redmond Pulling  '@[facility]'$    INSERTION OF MESH Left 06/14/2017   Procedure: INSERTION OF MESH;  Surgeon: Erroll Luna, MD;  Location:  [place];  Service: General;  Laterality: Left;   KNEE ARTHROSCOPY W/ MENISCAL REPAIR Right 09/2015   POLYPECTOMY     TONSILLECTOMY AND ADENOIDECTOMY  child   Patient Active Problem List   Diagnosis Date Noted   Impingement syndrome of left shoulder 02/11/2022   Multiple rib fractures 01/27/2022   Pneumothorax 01/27/2022   DOE (dyspnea on exertion) 12/30/2021   Nontraumatic incomplete tear of left rotator cuff 03/13/2020   Unilateral primary osteoarthritis, right knee 11/01/2017   Chest pain 08/07/2017   Bradycardia 08/07/2017   History of right tennis elbow 05/09/2017   Right tennis elbow 04/07/2017   Prolapsed internal hemorrhoids, grade 3 06/03/2016   Hx of adenomatous colonic polyps 05/27/2016    PCP: Ginger Organ, MD  REFERRING PROVIDER: Garald Balding, MD  REFERRING DIAG:  Diagnosis  M54.2 (ICD-10-CM) - Neck pain    THERAPY DIAG:  Abnormal posture  Cervicalgia  Other low back pain  Rationale for Evaluation and Treatment: Rehabilitation  ONSET DATE: January 06, 2022 fell from 14 feet from a deck.  SUBJECTIVE:  SUBJECTIVE STATEMENT: Andrew Payne notes Rt sided upper trapezius, low back and rib pain after falling from his unfinished deck in [employer].  He can't bend over and tie his shoes or reach very high.  Rt sided low back, Lt scapula and upper trapezius, neck and rib pain are noted.  He had to be flown by helicopter to a trauma center in [place] before an ICU stay.  This is his first PT post-injury.  PERTINENT HISTORY:  Carotid artery stenosis, Lt Morton's neuroma surgery, Lt elbow surgery, hernia repair, Rt knee scope, Lt RTC repair 2022, multiple rib fractures 12/2021  PAIN:  Are you having pain? Yes: NPRS scale: 3-5/10 Pain location:  Neck, left upper trapezius, left scapula, ribs and low back Pain description: Ache, sharp/stabbing Aggravating factors: Walking, bending, reaching overhead Relieving factors: NA  PRECAUTIONS: Other: Multiple broken ribs are healing  WEIGHT BEARING RESTRICTIONS: No  FALLS:  Has patient fallen in last 6 months? Yes. Number of falls 1  LIVING ENVIRONMENT: Lives with: lives with their family and lives with their spouse Lives in: House/apartment Stairs:  No problems Has following equipment at home: None  OCCUPATION: Retired  PLOF: Independent  PATIENT GOALS: Return to house, yard chores, golf and normal function  NEXT MD VISIT: ?  OBJECTIVE:   DIAGNOSTIC FINDINGS:  Films of the cervical spine obtained in 2 projections.  There is very  minimal straightening of the normal cervical lordosis.  There are  degenerative changes beginning at C5 extending to C7 with narrowing of the  disc spaces and some anterior osteophytes.  Some mild facet sclerosis at  the same levels.  No listhesis or scoliosis  PATIENT SURVEYS:  FOTO 72 (risk-adjusted 56) Goal 72  COGNITION: Overall cognitive status: Within functional limits for tasks assessed  SENSATION: No compliants of peripheral pain or paresthesias  POSTURE: rounded shoulders, forward head, and decreased lumbar lordosis  PALPATION: Significant for protracted left scapula   CERVICAL & LUMBAR ROM:   Active ROM A/PROM (deg) eval  Cervical Extension 70  Lumbar Extension 25  Right lumbar lateral flexion 40  Left lumbar lateral flexion 30  Right rotation 55  Left rotation 40  Lumbar Flexion 85   (Blank rows = not tested)  UPPER & LOWER EXTREMITY ROM:  Passive ROM Right eval Left eval  Shoulder flexion 145 145  Shoulder extension    Shoulder abduction    Shoulder horizontal adduction 40 40  Shoulder extension    Shoulder internal rotation 45 60  Shoulder external rotation 90 80  Hip flexion 110 110  Hip ER 20 20  Hip  IR 10 20  Hamstrings  40 35                   (Blank rows = not tested)  UPPER EXTREMITY MMT:  MMT Right eval Left eval  Shoulder flexion    Shoulder extension    Shoulder abduction    Shoulder adduction    Shoulder extension    Shoulder internal rotation    Shoulder external rotation    Middle trapezius    Lower trapezius    Elbow flexion    Elbow extension    Wrist flexion    Wrist extension    Wrist ulnar deviation    Wrist radial deviation    Wrist pronation    Wrist supination    Grip strength     (Blank rows = not tested)  Strength testing was deferred secondary to discomfort and because the  variety of objective range measurements took a lot of time TODAY'S TREATMENT:                                                                                                                              DATE: 03/22/2022 Shoulder blade pinches 10 x 5 seconds Shoulder blade pinches with shoulder hike 10 x 5 seconds Lumbar extension active range of motion 10 x 3 seconds Supine shoulder flexion active range of motion with protraction first and palms facing in 10 x 5 seconds Figure 4 stretch/hip external rotation stretch 4 x 20 seconds  PATIENT EDUCATION:  Education details: Reviewed exam findings and home exercise program Person educated: Patient Education method: Explanation, Demonstration, Tactile cues, Verbal cues, and Handouts Education comprehension: verbalized understanding, returned demonstration, verbal cues required, tactile cues required, and needs further education  HOME EXERCISE PROGRAM: Access Code: [email or web address] URL: [email or web address] Date: 03/22/2022 Prepared by: Vista Mink  Exercises - Standing Lumbar Extension at [place]  - 5 x daily - 7 x weekly - 1 sets - 5 reps - 3 seconds hold - Standing Scapular Retraction  - 5 x daily - 7 x weekly - 1 sets - 5 reps - 5 second hold - Supine Figure 4 Piriformis Stretch  - 2-3 x daily - 7 x  weekly - 1 sets - 5 reps - 20 seconds hold - Supine Shoulder Flexion AAROM with Dowel  - 2-3 x daily - 7 x weekly - 1 sets - 5-10 reps - 5-10 seconds hold  ASSESSMENT:  CLINICAL IMPRESSION: Patient is a 76 y.o. male who was seen today for physical therapy evaluation and treatment for neck, back and rib pain status post fall.  Andrew Payne reports particular difficulty with walking, at night, with reaching overhead and with bending forward such as when tying his shoes.  He is very stiff and generally sore and will benefit from the recommended plan and plan of care for return to full function.  OBJECTIVE IMPAIRMENTS: Abnormal gait, decreased activity tolerance, decreased endurance, difficulty walking, decreased ROM, hypomobility, impaired perceived functional ability, impaired flexibility, impaired UE functional use, postural dysfunction, and pain.   ACTIVITY LIMITATIONS: carrying, lifting, bending, sleeping, and reach over head  PARTICIPATION LIMITATIONS: cleaning, community activity, and yard work  PERSONAL FACTORS:  Carotid artery stenosis, Lt Morton's neuroma surgery, Lt elbow surgery, hernia repair, Rt knee scope, Lt RTC repair 2022, multiple rib fractures 12/2021 are also affecting patient's functional outcome.   REHAB POTENTIAL: Good  CLINICAL DECISION MAKING: Evolving/moderate complexity  EVALUATION COMPLEXITY: Moderate   GOALS: Goals reviewed with patient? Yes  SHORT TERM GOALS: Target date: 04/20/2022  Improve bilateral shoulder flexion active range of motion to 160 degrees Baseline: 145 degrees Goal status: INITIAL  2.  Improved bilateral hip external rotation to 30 degrees Baseline: 20 degrees Goal status: INITIAL  3.  Improve bilateral hamstrings flexibility to 45 degrees Baseline: 35 to 40 degrees Goal status: INITIAL   LONG  TERM GOALS: Target date: 05/17/2022  Improve FOTO to 72 Baseline: Risk adjusted 56 Goal status: INITIAL  2.  Dmoni will report neck, upper  trapezius, scapular, rib and low back pain consistently 0-3 out of 10 on the visual analog scale Baseline: 3-5 out of 10 Goal status: INITIAL  3.  Improve bilateral shoulder flexion active range of motion to 170 degrees Baseline: 145 degrees Goal status: INITIAL  4.  Improve bilateral hip external rotation active range of motion to 40 degrees Baseline: 20 degrees Goal status: INITIAL  5.  Andrew Payne will be able to tie his shoes, reach overhead and sleep and sleep with no more than 3 out of 10 pain on visual analog scale Baseline: Unable to tie his shoes or reach overhead and he has difficulty sleeping Goal status: INITIAL  6.  Andrew Payne will be independent with his long-term home exercise program at discharge Baseline: Started 03/22/2022 Goal status: INITIAL   PLAN:  PT FREQUENCY: 1x/week  PT DURATION:  10 sessions  PLANNED INTERVENTIONS: Therapeutic exercises, Therapeutic activity, Neuromuscular re-education, Balance training, Gait training, Patient/Family education, Self Care, Stair training, Cryotherapy, and Manual therapy  PLAN FOR NEXT SESSION: Review his current home exercise program, add a supine hamstrings stretch, appropriate progressions to assist with overhead function and being able to tie his shoes with correct biomechanics.  Address muscular soreness of the neck, scapula, upper trapezius and low back with appropriate gentle strengthening exercises.   Farley Ly, PT, MPT 03/22/2022, 10:33 AM

## 2022-03-25 ENCOUNTER — Ambulatory Visit (HOSPITAL_COMMUNITY): Payer: PPO | Attending: Cardiology

## 2022-03-25 DIAGNOSIS — R0602 Shortness of breath: Secondary | ICD-10-CM | POA: Diagnosis not present

## 2022-03-26 NOTE — Therapy (Unsigned)
OUTPATIENT PHYSICAL THERAPY CERVICAL EVALUATION   Patient Name: Andrew Payne MRN: [id number] DOB:25-Oct-1945, 76 y.o., male Today's Date: 03/29/2022  END OF SESSION:  PT End of Session - 03/29/22 1301     Visit Number 2    Number of Visits 10    Progress Note Due on Visit 10    PT Start Time 1100    PT Stop Time 9163    PT Time Calculation (min) 45 min    Activity Tolerance Patient tolerated treatment well    Behavior During Therapy WFL for tasks assessed/performed              Past Medical History:  Diagnosis Date   Bradycardia    cardiology-- dr Curt Bears--  work-up done included event monitor and echo (all in epic)   Carotid artery stenosis    per pt H&P dated 10-20-2018 --  <40%   Coronary atherosclerosis    Glaucoma, right eye    History of nuclear stress test 04/15/2005   (in epic)  for near syncope--- Low risk normal no ischemia, ef 57%   Hx of adenomatous colonic polyps 05/27/2016   Internal hemorrhoids    Morton neuroma, left    OA (osteoarthritis)    wrist, elbow, thumb, knees   PVC's (premature ventricular contractions)    RBBB (right bundle branch block)    States heart rate has been as low as 28 and asyptomatic   Past Surgical History:  Procedure Laterality Date   COLONOSCOPY  05/2016   TA   ELBOW SURGERY Left 04/2017   EXCISION MORTON'S NEUROMA Left 10/23/2018   Procedure: EXCISION MORTON'S NEUROMA LEFT FOOT;  Surgeon: Rosemary Holms, DPM;  Location: [place];  Service: Podiatry;  Laterality: Left;   HEMORRHOID BANDING  2018   INGUINAL HERNIA REPAIR Left 06/14/2017   Procedure: LEFT INGUINAL HERNIA REPAIR ERAS PATHWAY;  Surgeon: Erroll Luna, MD;  Location: [place];  Service: General;  Laterality: Left;   INGUINAL HERNIA REPAIR Right 11-18-2009   dr Redmond Pulling  '@[facility]'$    INSERTION OF MESH Left 06/14/2017   Procedure: INSERTION OF MESH;  Surgeon: Erroll Luna, MD;  Location: [place];  Service: General;   Laterality: Left;   KNEE ARTHROSCOPY W/ MENISCAL REPAIR Right 09/2015   POLYPECTOMY     TONSILLECTOMY AND ADENOIDECTOMY  child   Patient Active Problem List   Diagnosis Date Noted   Impingement syndrome of left shoulder 02/11/2022   Multiple rib fractures 01/27/2022   Pneumothorax 01/27/2022   DOE (dyspnea on exertion) 12/30/2021   Nontraumatic incomplete tear of left rotator cuff 03/13/2020   Unilateral primary osteoarthritis, right knee 11/01/2017   Chest pain 08/07/2017   Bradycardia 08/07/2017   History of right tennis elbow 05/09/2017   Right tennis elbow 04/07/2017   Prolapsed internal hemorrhoids, grade 3 06/03/2016   Hx of adenomatous colonic polyps 05/27/2016    PCP: Ginger Organ, MD  REFERRING PROVIDER: Garald Balding, MD  REFERRING DIAG:  Diagnosis  M54.2 (ICD-10-CM) - Neck pain    THERAPY DIAG:  Abnormal posture  Cervicalgia  Other low back pain  Rationale for Evaluation and Treatment: Rehabilitation  ONSET DATE: January 06, 2022 fell from 14 feet from a deck.  SUBJECTIVE:  SUBJECTIVE STATEMENT: Pt states that his lower back is no better. He also reports protrusion of his L shoulder blade. He states that he has a catch in his L rhomboid that causes intermittent pain with reaching forward.   PERTINENT HISTORY:  Carotid artery stenosis, Lt Morton's neuroma surgery, Lt elbow surgery, hernia repair, Rt knee scope, Lt RTC repair 2022, multiple rib fractures 12/2021  PAIN:  Are you having pain? Yes: NPRS scale: 3-5/10 Pain location: Neck, left upper trapezius, left scapula, ribs and low back Pain description: Ache, sharp/stabbing Aggravating factors: Walking, bending, reaching overhead Relieving factors: NA  PRECAUTIONS: Other: Multiple broken ribs  are healing  WEIGHT BEARING RESTRICTIONS: No  FALLS:  Has patient fallen in last 6 months? Yes. Number of falls 1  LIVING ENVIRONMENT: Lives with: lives with their family and lives with their spouse Lives in: House/apartment Stairs:  No problems Has following equipment at home: None  OCCUPATION: Retired  PLOF: Independent  PATIENT GOALS: Return to house, yard chores, golf and normal function  NEXT MD VISIT: ?  OBJECTIVE:   DIAGNOSTIC FINDINGS:  Films of the cervical spine obtained in 2 projections.  There is very  minimal straightening of the normal cervical lordosis.  There are  degenerative changes beginning at C5 extending to C7 with narrowing of the  disc spaces and some anterior osteophytes.  Some mild facet sclerosis at  the same levels.  No listhesis or scoliosis  PATIENT SURVEYS:  FOTO 72 (risk-adjusted 56) Goal 72  COGNITION: Overall cognitive status: Within functional limits for tasks assessed  SENSATION: No compliants of peripheral pain or paresthesias  POSTURE: rounded shoulders, forward head, and decreased lumbar lordosis  PALPATION: Significant for protracted left scapula   CERVICAL & LUMBAR ROM:   Active ROM A/PROM (deg) eval  Cervical Extension 70  Lumbar Extension 25  Right lumbar lateral flexion 40  Left lumbar lateral flexion 30  Right rotation 55  Left rotation 40  Lumbar Flexion 85   (Blank rows = not tested)  UPPER & LOWER EXTREMITY ROM:  Passive ROM Right eval Left eval  Shoulder flexion 145 145  Shoulder extension    Shoulder abduction    Shoulder horizontal adduction 40 40  Shoulder extension    Shoulder internal rotation 45 60  Shoulder external rotation 90 80  Hip flexion 110 110  Hip ER 20 20  Hip IR 10 20  Hamstrings  40 35                   (Blank rows = not tested)  UPPER EXTREMITY MMT:  MMT Right eval Left eval  Shoulder flexion    Shoulder extension    Shoulder abduction    Shoulder adduction     Shoulder extension    Shoulder internal rotation    Shoulder external rotation    Middle trapezius    Lower trapezius    Elbow flexion    Elbow extension    Wrist flexion    Wrist extension    Wrist ulnar deviation    Wrist radial deviation    Wrist pronation    Wrist supination    Grip strength     (Blank rows = not tested)  Strength testing was deferred secondary to discomfort and because the variety of objective range measurements took a lot of time TODAY'S TREATMENT:   DATE: 03/29/2022        UBE 3/3 fwd/ bkwd, while taking subjective.  Shoulder blade pinches 10 x 5  seconds Rows with BTB 2x10  Shoulder ext with BTB 2x10  90/90 ER with BTB 2x10  Bilat ER with BTB 2x10  Posterior GHJ mobs  Passive pec stretch  Muscle bending to pec Doorway pec stretch   Self mob with tennis ball on wall                                                                                                                DATE: 03/22/2022 Shoulder blade pinches 10 x 5 seconds Shoulder blade pinches with shoulder hike 10 x 5 seconds Lumbar extension active range of motion 10 x 3 seconds Supine shoulder flexion active range of motion with protraction first and palms facing in 10 x 5 seconds Figure 4 stretch/hip external rotation stretch 4 x 20 seconds  PATIENT EDUCATION:  Education details: Reviewed exam findings and home exercise program Person educated: Patient Education method: Explanation, Demonstration, Tactile cues, Verbal cues, and Handouts Education comprehension: verbalized understanding, returned demonstration, verbal cues required, tactile cues required, and needs further education  HOME EXERCISE PROGRAM: Access Code: [email or web address] URL: [email or web address] Date: 03/22/2022 Prepared by: Vista Mink  Exercises - Standing Lumbar Extension at [place]  - 5 x daily - 7 x weekly - 1 sets - 5 reps - 3 seconds hold - Standing Scapular Retraction  - 5 x daily - 7 x  weekly - 1 sets - 5 reps - 5 second hold - Supine Figure 4 Piriformis Stretch  - 2-3 x daily - 7 x weekly - 1 sets - 5 reps - 20 seconds hold - Supine Shoulder Flexion AAROM with Dowel  - 2-3 x daily - 7 x weekly - 1 sets - 5-10 reps - 5-10 seconds hold  ASSESSMENT:  CLINICAL IMPRESSION: Pt returns to PT with reports of continued R hip pain and concerns with his protracted shoulder. He states that he has been active with his HEP, but gets a frequent pain in his rhomboid with OH movements. Session initiated on UBE for mobility prior to assessing pts R shoulder. He has significant pec tightness that could be contributing to his protracted scapulae. STM and stretches with pt reporting some relief. Pt tolerated all prescribed exercises with no reported pain. Discussed retuning to the gym with slow progressions and minimizing pain. Pt has a trigger point in his L rhomboid. Due to lack of time, educated him on how to self mobilize at home. Plan to work on area next session. Updated HEP. Pt with trochanteric bursitis in his R hip based on symptoms. Encouraged him to utilize ice and take NSAID's as needed for pain. Pt will continue to benefit from skilled PT to address continued deficits.   OBJECTIVE IMPAIRMENTS: Abnormal gait, decreased activity tolerance, decreased endurance, difficulty walking, decreased ROM, hypomobility, impaired perceived functional ability, impaired flexibility, impaired UE functional use, postural dysfunction, and pain.   ACTIVITY LIMITATIONS: carrying, lifting, bending, sleeping, and reach over head  PARTICIPATION LIMITATIONS: cleaning, community activity, and yard work  PERSONAL FACTORS:  Carotid artery stenosis, Lt Morton's  neuroma surgery, Lt elbow surgery, hernia repair, Rt knee scope, Lt RTC repair 2022, multiple rib fractures 12/2021 are also affecting patient's functional outcome.   REHAB POTENTIAL: Good  CLINICAL DECISION MAKING: Evolving/moderate complexity  EVALUATION  COMPLEXITY: Moderate   GOALS: Goals reviewed with patient? Yes  SHORT TERM GOALS: Target date: 04/20/2022  Improve bilateral shoulder flexion active range of motion to 160 degrees Baseline: 145 degrees Goal status: INITIAL  2.  Improved bilateral hip external rotation to 30 degrees Baseline: 20 degrees Goal status: INITIAL  3.  Improve bilateral hamstrings flexibility to 45 degrees Baseline: 35 to 40 degrees Goal status: INITIAL   LONG TERM GOALS: Target date: 05/17/2022  Improve FOTO to 72 Baseline: Risk adjusted 56 Goal status: INITIAL  2.  [place] will report neck, upper trapezius, scapular, rib and low back pain consistently 0-3 out of 10 on the visual analog scale Baseline: 3-5 out of 10 Goal status: INITIAL  3.  Improve bilateral shoulder flexion active range of motion to 170 degrees Baseline: 145 degrees Goal status: INITIAL  4.  Improve bilateral hip external rotation active range of motion to 40 degrees Baseline: 20 degrees Goal status: INITIAL  5.  [name] will be able to tie his shoes, reach overhead and sleep and sleep with no more than 3 out of 10 pain on visual analog scale Baseline: Unable to tie his shoes or reach overhead and he has difficulty sleeping Goal status: INITIAL  6.  [name] will be independent with his long-term home exercise program at discharge Baseline: Started 03/22/2022 Goal status: INITIAL   PLAN:  PT FREQUENCY: 1x/week  PT DURATION:  10 sessions  PLANNED INTERVENTIONS: Therapeutic exercises, Therapeutic activity, Neuromuscular re-education, Balance training, Gait training, Patient/Family education, Self Care, Stair training, Cryotherapy, and Manual therapy  PLAN FOR NEXT SESSION: Review his current home exercise program, add a supine hamstrings stretch, appropriate progressions to assist with overhead function and being able to tie his shoes with correct biomechanics.  Address muscular soreness of the neck, scapula, upper trapezius  and low back with appropriate gentle strengthening exercises.   Lynden Ang, PT, MPT 03/29/2022, 1:02 PM

## 2022-03-29 ENCOUNTER — Encounter: Payer: Self-pay | Admitting: Physical Therapy

## 2022-03-29 ENCOUNTER — Ambulatory Visit (INDEPENDENT_AMBULATORY_CARE_PROVIDER_SITE_OTHER): Payer: PPO | Admitting: Physical Therapy

## 2022-03-29 DIAGNOSIS — M5459 Other low back pain: Secondary | ICD-10-CM

## 2022-03-29 DIAGNOSIS — R293 Abnormal posture: Secondary | ICD-10-CM

## 2022-03-29 DIAGNOSIS — M542 Cervicalgia: Secondary | ICD-10-CM | POA: Diagnosis not present

## 2022-04-07 ENCOUNTER — Encounter: Payer: Self-pay | Admitting: Orthopaedic Surgery

## 2022-04-07 ENCOUNTER — Ambulatory Visit: Payer: PPO | Admitting: Orthopaedic Surgery

## 2022-04-07 DIAGNOSIS — M7542 Impingement syndrome of left shoulder: Secondary | ICD-10-CM | POA: Diagnosis not present

## 2022-04-07 DIAGNOSIS — K4090 Unilateral inguinal hernia, without obstruction or gangrene, not specified as recurrent: Secondary | ICD-10-CM | POA: Diagnosis not present

## 2022-04-07 DIAGNOSIS — R1030 Lower abdominal pain, unspecified: Secondary | ICD-10-CM | POA: Diagnosis not present

## 2022-04-07 DIAGNOSIS — M1711 Unilateral primary osteoarthritis, right knee: Secondary | ICD-10-CM

## 2022-04-07 DIAGNOSIS — S2243XD Multiple fractures of ribs, bilateral, subsequent encounter for fracture with routine healing: Secondary | ICD-10-CM

## 2022-04-07 NOTE — Progress Notes (Signed)
Office Visit Note   Patient: Andrew Payne           Date of Birth: [birth year]           MRN: [id number] Visit Date: 04/07/2022              Requested by: Ginger Organ., MD [address] [place],  [place] [address] PCP: Ginger Organ., MD   Assessment & Plan: Visit Diagnoses:  1. Impingement syndrome of left shoulder   2. Multiple closed fractures of ribs of both sides with routine healing, subsequent encounter   3. Unilateral primary osteoarthritis, right knee     Plan: Andrew Payne is 3 months status post injury as outlined in prior office notes.  He sustained multiple rib fractures on the left side which are healing.  One of the rib fractures was displaced and created a pneumothorax.  He is shortness of breath is slowly resolved.  He was followed by the chest surgeons who felt that that this would slowly resolve on its own.  He is able to walk and function without shortness of breath.  He has had some issues with his left shoulder.  A repeat MRI scan did not reveal any evidence of a fracture or recurrent rotator cuff tear.  I think a lot of the pain has been related to the displaced rib fracture behind the scapula.  As time progresses he is having better range of motion with therapy and a little bit less pain.  Neurologically he is intact.  From my standpoint I think this will continue to improve over a period of time and does not need any further treatment.  However, if it continues to be an issue or it stabilizes to the point where he still having some functional loss I would refer him back to the chest surgeons.  He also relates that he has had some recurrent pain in his right knee.  He has had evidence of degenerative arthritis and has had several courses of viscosupplementation the last being several years ago and it worked quite well so I will do the pre-CERT the viscosupplementation This patient is diagnosed with osteoarthritis of the knee(s).    Radiographs show evidence of  joint space narrowing, osteophytes, subchondral sclerosis and/or subchondral cysts.  This patient has knee pain which interferes with functional and activities of daily living.    This patient has experienced inadequate response, adverse effects and/or intolerance with conservative treatments such as acetaminophen, NSAIDS, topical creams, physical therapy or regular exercise, knee bracing and/or weight loss.   This patient has experienced inadequate response or has a contraindication to intra articular steroid injections for at least 3 months.   This patient is not scheduled to have a total knee replacement within 6 months of starting treatment with viscosupplementation.   Follow-Up Instructions: Return Pre-CERT viscosupplementation right knee.   Orders:  No orders of the defined types were placed in this encounter.  No orders of the defined types were placed in this encounter.     Procedures: No procedures performed   Clinical Data: No additional findings.   Subjective: No chief complaint on file. 3 months status post the injury as previously outlined.  [place] and [place] multiple rib fractures as well as a concussion.  Slowly healing from all of his injuries.  Going to physical therapy for range of motion and strengthening of his left shoulder.  Also having some recurrent pain  in his right knee.  Has a history of osteoarthritis and has done well in the past with viscosupplementation  HPI  Review of Systems   Objective: Vital Signs: There were no vitals taken for this visit.  Physical Exam Constitutional:      Appearance: He is well-developed.  Eyes:     Pupils: Pupils are equal, round, and reactive to light.  Pulmonary:     Effort: Pulmonary effort is normal.  Skin:    General: Skin is warm and dry.  Neurological:     Mental Status: He is alert and oriented to person, place, and time.  Psychiatric:        Behavior: Behavior  normal.     Ortho Exam awake alert and oriented x 3.  Comfortable sitting.  Able to place his left arm overhead fairly quickly which is a definite improvement from prior evaluation.  No impingement.  Occasionally has little bit of catching beneath his scapula probably related to the rib fracture as previously outlined.  Good grip and good release No neck pain.  No referred pain to either upper extremity range of motion of the cervical spine.  Right knee with some mild medial joint pain but no effusion.  Walks without a limp  Specialty Comments:  No specialty comments available.  Imaging: No results found.   PMFS History: Patient Active Problem List   Diagnosis Date Noted   Impingement syndrome of left shoulder 02/11/2022   Multiple rib fractures 01/27/2022   Pneumothorax 01/27/2022   DOE (dyspnea on exertion) 12/30/2021   Nontraumatic incomplete tear of left rotator cuff 03/13/2020   Unilateral primary osteoarthritis, right knee 11/01/2017   Chest pain 08/07/2017   Bradycardia 08/07/2017   History of right tennis elbow 05/09/2017   Right tennis elbow 04/07/2017   Prolapsed internal hemorrhoids, grade 3 06/03/2016   Hx of adenomatous colonic polyps 05/27/2016   Past Medical History:  Diagnosis Date   Bradycardia    cardiology-- dr Curt Bears--  work-up done included event monitor and echo (all in epic)   Carotid artery stenosis    per pt H&P dated 10-20-2018 --  <40%   Coronary atherosclerosis    Glaucoma, right eye    History of nuclear stress test 04/15/2005   (in epic)  for near syncope--- Low risk normal no ischemia, ef 57%   Hx of adenomatous colonic polyps 05/27/2016   Internal hemorrhoids    Morton neuroma, left    OA (osteoarthritis)    wrist, elbow, thumb, knees   PVC's (premature ventricular contractions)    RBBB (right bundle branch block)    States heart rate has been as low as 36 and asyptomatic    Family History  Problem Relation Age of Onset   Heart  disease Mother    Alzheimer's disease Father    Colon polyps Father    Colon cancer Neg Hx    Pancreatic cancer Neg Hx    Prostate cancer Neg Hx    Rectal cancer Neg Hx    Esophageal cancer Neg Hx     Past Surgical History:  Procedure Laterality Date   COLONOSCOPY  05/2016   TA   ELBOW SURGERY Left 04/2017   EXCISION MORTON'S NEUROMA Left 10/23/2018   Procedure: EXCISION MORTON'S NEUROMA LEFT FOOT;  Surgeon: Rosemary Holms, DPM;  Location: [facility];  Service: Podiatry;  Laterality: Left;   HEMORRHOID BANDING  2018   INGUINAL HERNIA REPAIR Left 06/14/2017   Procedure: LEFT INGUINAL HERNIA REPAIR  ERAS PATHWAY;  Surgeon: Erroll Luna, MD;  Location: [facility];  Service: General;  Laterality: Left;   INGUINAL HERNIA REPAIR Right 11-18-2009   dr Redmond Pulling  '@[facility]'$    INSERTION OF MESH Left 06/14/2017   Procedure: INSERTION OF MESH;  Surgeon: Erroll Luna, MD;  Location: [facility];  Service: General;  Laterality: Left;   KNEE ARTHROSCOPY W/ MENISCAL REPAIR Right 09/2015   POLYPECTOMY     TONSILLECTOMY AND ADENOIDECTOMY  child   Social History   Occupational History   Not on file  Tobacco Use   Smoking status: Never   Smokeless tobacco: Never  Vaping Use   Vaping Use: Never used  Substance and Sexual Activity   Alcohol use: Not Currently   Drug use: No   Sexual activity: Not on file     Garald Balding, MD   Note - This record has been created using Editor, commissioning.  Chart creation errors have been sought, but may not always  have been located. Such creation errors do not reflect on  the standard of medical care.

## 2022-04-08 ENCOUNTER — Encounter: Payer: Self-pay | Admitting: Rehabilitative and Restorative Service Providers"

## 2022-04-08 ENCOUNTER — Ambulatory Visit (INDEPENDENT_AMBULATORY_CARE_PROVIDER_SITE_OTHER): Payer: PPO | Admitting: Rehabilitative and Restorative Service Providers"

## 2022-04-08 DIAGNOSIS — M5459 Other low back pain: Secondary | ICD-10-CM

## 2022-04-08 DIAGNOSIS — R293 Abnormal posture: Secondary | ICD-10-CM | POA: Diagnosis not present

## 2022-04-08 DIAGNOSIS — M542 Cervicalgia: Secondary | ICD-10-CM

## 2022-04-08 NOTE — Therapy (Signed)
OUTPATIENT PHYSICAL THERAPY TREATMENT NOTE   Patient Name: Andrew Payne MRN: [id number] DOB:[birth year], 76 y.o., male Today's Date: 04/08/2022  END OF SESSION:  PT End of Session - 04/08/22 0925     Visit Number 3    Number of Visits 10    Progress Note Due on Visit 10    PT Start Time 2751    PT Stop Time 1009    PT Time Calculation (min) 45 min    Activity Tolerance Patient tolerated treatment well;No increased pain    Behavior During Therapy [facility] for tasks assessed/performed               Past Medical History:  Diagnosis Date   Bradycardia    cardiology-- dr Curt Bears--  work-up done included event monitor and echo (all in epic)   Carotid artery stenosis    per pt H&P dated 10-20-2018 --  <40%   Coronary atherosclerosis    Glaucoma, right eye    History of nuclear stress test 04/15/2005   (in epic)  for near syncope--- Low risk normal no ischemia, ef 57%   Hx of adenomatous colonic polyps 05/27/2016   Internal hemorrhoids    Morton neuroma, left    OA (osteoarthritis)    wrist, elbow, thumb, knees   PVC's (premature ventricular contractions)    RBBB (right bundle branch block)    States heart rate has been as low as 28 and asyptomatic   Past Surgical History:  Procedure Laterality Date   COLONOSCOPY  05/2016   TA   ELBOW SURGERY Left 04/2017   EXCISION MORTON'S NEUROMA Left 10/23/2018   Procedure: EXCISION MORTON'S NEUROMA LEFT FOOT;  Surgeon: Rosemary Holms, DPM;  Location: [facility];  Service: Podiatry;  Laterality: Left;   HEMORRHOID BANDING  2018   INGUINAL HERNIA REPAIR Left 06/14/2017   Procedure: LEFT INGUINAL HERNIA REPAIR ERAS PATHWAY;  Surgeon: Erroll Luna, MD;  Location: [facility];  Service: General;  Laterality: Left;   INGUINAL HERNIA REPAIR Right 11-18-2009   dr Redmond Pulling  '@[facility]'$    INSERTION OF MESH Left 06/14/2017   Procedure: INSERTION OF MESH;  Surgeon: Erroll Luna, MD;  Location: [facility];   Service: General;  Laterality: Left;   KNEE ARTHROSCOPY W/ MENISCAL REPAIR Right 09/2015   POLYPECTOMY     TONSILLECTOMY AND ADENOIDECTOMY  child   Patient Active Problem List   Diagnosis Date Noted   Impingement syndrome of left shoulder 02/11/2022   Multiple rib fractures 01/27/2022   Pneumothorax 01/27/2022   DOE (dyspnea on exertion) 12/30/2021   Nontraumatic incomplete tear of left rotator cuff 03/13/2020   Unilateral primary osteoarthritis, right knee 11/01/2017   Chest pain 08/07/2017   Bradycardia 08/07/2017   History of right tennis elbow 05/09/2017   Right tennis elbow 04/07/2017   Prolapsed internal hemorrhoids, grade 3 06/03/2016   Hx of adenomatous colonic polyps 05/27/2016    PCP: Ginger Organ, MD  REFERRING PROVIDER: Garald Balding, MD  REFERRING DIAG:  Diagnosis  M54.2 (ICD-10-CM) - Neck pain    THERAPY DIAG:  Abnormal posture  Cervicalgia  Other low back pain  Rationale for Evaluation and Treatment: Rehabilitation  ONSET DATE: January 06, 2022 fell from 14 feet from a deck.  SUBJECTIVE:  SUBJECTIVE STATEMENT: Andrew Payne notes progress since starting supervised physical therapy.  He still has pain with the same functions that he had at evaluation, although pain is less and function is better.  Left upper trapezius, scapula and hip are most limited while his neck and back are making the fastest progress.  PERTINENT HISTORY:  Carotid artery stenosis, Lt Morton's neuroma surgery, Lt elbow surgery, hernia repair, Rt knee scope, Lt RTC repair 2022, multiple rib fractures 12/2021  PAIN:  Are you having pain? Yes: NPRS scale: 2-5/10 Pain location: Neck, left upper trapezius, left scapula, ribs and low back Pain description: Ache, sharp/stabbing Aggravating  factors: Walking, bending, reaching overhead Relieving factors: NA  PRECAUTIONS: Other: Multiple broken ribs are healing  WEIGHT BEARING RESTRICTIONS: No  FALLS:  Has patient fallen in last 6 months? Yes. Number of falls 1  LIVING ENVIRONMENT: Lives with: lives with their family and lives with their spouse Lives in: House/apartment Stairs:  No problems Has following equipment at home: None  OCCUPATION: Retired  PLOF: Independent  PATIENT GOALS: Return to house, yard chores, golf and normal function  NEXT MD VISIT: ?  OBJECTIVE:   DIAGNOSTIC FINDINGS:  Films of the cervical spine obtained in 2 projections.  There is very  minimal straightening of the normal cervical lordosis.  There are  degenerative changes beginning at C5 extending to C7 with narrowing of the  disc spaces and some anterior osteophytes.  Some mild facet sclerosis at  the same levels.  No listhesis or scoliosis  PATIENT SURVEYS:  FOTO 72 (risk-adjusted 56) Goal 72  COGNITION: Overall cognitive status: Within functional limits for tasks assessed  SENSATION: No compliants of peripheral pain or paresthesias  POSTURE: rounded shoulders, forward head, and decreased lumbar lordosis  PALPATION: Significant for protracted left scapula   CERVICAL & LUMBAR ROM:   Active ROM A/PROM (deg) eval  Cervical Extension 70  Lumbar Extension 25  Right lumbar lateral flexion 40  Left lumbar lateral flexion 30  Right rotation 55  Left rotation 40  Lumbar Flexion 85   (Blank rows = not tested)  UPPER & LOWER EXTREMITY ROM:  Passive ROM Right eval Left eval  Shoulder flexion 145 145  Shoulder extension    Shoulder abduction    Shoulder horizontal adduction 40 40  Shoulder extension    Shoulder internal rotation 45 60  Shoulder external rotation 90 80  Hip flexion 110 110  Hip ER 20 20  Hip IR 10 20  Hamstrings  40 35                   (Blank rows = not tested)  UPPER EXTREMITY MMT:  MMT  Right eval Left eval  Shoulder flexion    Shoulder extension    Shoulder abduction    Shoulder adduction    Shoulder extension    Shoulder internal rotation    Shoulder external rotation    Middle trapezius    Lower trapezius    Elbow flexion    Elbow extension    Wrist flexion    Wrist extension    Wrist ulnar deviation    Wrist radial deviation    Wrist pronation    Wrist supination    Grip strength     (Blank rows = not tested)  Strength testing was deferred secondary to discomfort and because the variety of objective range measurements took a lot of time TODAY'S TREATMENT:   DATE:  04/08/2022 Shoulder blade pinches 10 x 5 seconds  Shoulder blade pinches with shoulder hike 10 x 5 seconds, last 5 with a 10# weight in each hand Lumbar extension active range of motion 10 x 3 seconds Supine scapular protraction 20X 3 seconds with 5# Supine shoulder flexion active range of motion with protraction first and palms facing in 10 x 10 seconds, last 5 with 3# bar Figure 4 stretch/hip external rotation stretch 4 x 20 seconds Prone T with thumbs up 10X 3 seconds (90 degrees) Rows with Blue theraband 20X 3 seconds Shoulder ER theraband Green 10X 3 seconds slow eccentrics Bil Chest stretch 15 seconds in doorframe Discussed theraband shoulder extension with palms facing forward   03/29/2022        UBE 3/3 fwd/ bkwd, while taking subjective.  Shoulder blade pinches 10 x 5 seconds Rows with BTB 2x10  Shoulder ext with BTB 2x10  90/90 ER with BTB 2x10  Bilat ER with BTB 2x10  Posterior GHJ mobs  Passive pec stretch  Muscle bending to pec Doorway pec stretch   Self mob with tennis ball on wall                                                                                                                DATE: 03/22/2022 Shoulder blade pinches 10 x 5 seconds Shoulder blade pinches with shoulder hike 10 x 5 seconds Lumbar extension active range of motion 10 x 3 seconds Supine  shoulder flexion active range of motion with protraction first and palms facing in 10 x 5 seconds Figure 4 stretch/hip external rotation stretch 4 x 20 seconds   PATIENT EDUCATION:  Education details: Reviewed exam findings and home exercise program Person educated: Patient Education method: Explanation, Demonstration, Tactile cues, Verbal cues, and Handouts Education comprehension: verbalized understanding, returned demonstration, verbal cues required, tactile cues required, and needs further education  HOME EXERCISE PROGRAM: Access Code: [email or web address] URL: [email or web address] Date: 04/08/2022 Prepared by: Vista Mink  Exercises - Standing Lumbar Extension at [place]  - 5 x daily - 7 x weekly - 1 sets - 5 reps - 3 seconds hold - Standing Scapular Retraction  - 5 x daily - 7 x weekly - 1 sets - 5 reps - 5 second hold - Supine Figure 4 Piriformis Stretch  - 2 x daily - 7 x weekly - 1 sets - 5 reps - 20 seconds hold - Supine Shoulder Flexion AAROM with Dowel  - 2 x daily - 7 x weekly - 1 sets - 5-10 reps - 5-10 seconds hold - Standing Row with Anchored Resistance  - 1 x daily - 7 x weekly - 1 sets - 20 reps - Shoulder extension with resistance - Neutral  - 1 x daily - 7 x weekly - 1 sets - 10 reps - 3 seconds hold - Shoulder External Rotation and Scapular Retraction with Resistance  - 1 x daily - 7 x weekly - 2 sets - 10 reps - 3 seconds hold - Doorway Pec Stretch at 60 Elevation  - 1 x daily -  7 x weekly - 1 sets - 10 reps - 10-15 seconds hold - Prone Shoulder Horizontal Abduction with Thumbs Up  - 1 x daily - 7 x weekly - 1-2 sets - 10 reps - 3 seconds hold - Supine Scapular Protraction in Flexion with Dumbbells  - 1 x daily - 7 x weekly - 1-2 sets - 20 reps - 3 seconds hold   ASSESSMENT:  CLINICAL IMPRESSION: Andrew Payne notes less pain vs evaluation.  He is still limited with his function, particularly with activities noted at evaluation (overhead function, tying shoes,  certain gym activities).  His HEP was progressed and updated today to continue his early progress.  I anticipate good objective and functional progress by his next visit in January.   OBJECTIVE IMPAIRMENTS: Abnormal gait, decreased activity tolerance, decreased endurance, difficulty walking, decreased ROM, hypomobility, impaired perceived functional ability, impaired flexibility, impaired UE functional use, postural dysfunction, and pain.   ACTIVITY LIMITATIONS: carrying, lifting, bending, sleeping, and reach over head  PARTICIPATION LIMITATIONS: cleaning, community activity, and yard work  PERSONAL FACTORS:  Carotid artery stenosis, Lt Morton's neuroma surgery, Lt elbow surgery, hernia repair, Rt knee scope, Lt RTC repair 2022, multiple rib fractures 12/2021 are also affecting patient's functional outcome.   REHAB POTENTIAL: Good  CLINICAL DECISION MAKING: Evolving/moderate complexity  EVALUATION COMPLEXITY: Moderate   GOALS: Goals reviewed with patient? Yes  SHORT TERM GOALS: Target date: 04/20/2022  Improve bilateral shoulder flexion active range of motion to 160 degrees Baseline: 145 degrees Goal status: On Going 04/08/2022  2.  Improved bilateral hip external rotation to 30 degrees Baseline: 20 degrees Goal status: On Going 04/08/2022  3.  Improve bilateral hamstrings flexibility to 45 degrees Baseline: 35 to 40 degrees Goal status: On Going 04/08/2022   LONG TERM GOALS: Target date: 05/17/2022  Improve FOTO to 72 Baseline: Risk adjusted 56 Goal status: INITIAL  2.  Talin will report neck, upper trapezius, scapular, rib and low back pain consistently 0-3 out of 10 on the visual analog scale Baseline: 3-5 out of 10 Goal status: On Going 04/08/2022  3.  Improve bilateral shoulder flexion active range of motion to 170 degrees Baseline: 145 degrees Goal status: On Going 04/08/2022  4.  Improve bilateral hip external rotation active range of motion to 40  degrees Baseline: 20 degrees Goal status: On Going 04/08/2022  5.  Andrew Payne will be able to tie his shoes, reach overhead and sleep and sleep with no more than 3 out of 10 pain on visual analog scale Baseline: Unable to tie his shoes or reach overhead and he has difficulty sleeping Goal status: On Going 04/08/2022  6.  Andrew Payne will be independent with his long-term home exercise program at discharge Baseline: Started 03/22/2022 Goal status: On Going 04/08/2022   PLAN:  PT FREQUENCY: 1x/week  PT DURATION:  10 sessions  PLANNED INTERVENTIONS: Therapeutic exercises, Therapeutic activity, Neuromuscular re-education, Balance training, Gait training, Patient/Family education, Self Care, Stair training, Cryotherapy, and Manual therapy  PLAN FOR NEXT SESSION: Review his updated home exercise program, (maybe) add a supine hamstrings stretch, appropriate progressions to assist with overhead function and being able to tie his shoes with correct biomechanics.  FOTO and progress note.   Farley Ly, PT, MPT 04/08/2022, 10:19 AM

## 2022-04-14 DIAGNOSIS — Z85828 Personal history of other malignant neoplasm of skin: Secondary | ICD-10-CM | POA: Diagnosis not present

## 2022-04-14 DIAGNOSIS — L570 Actinic keratosis: Secondary | ICD-10-CM | POA: Diagnosis not present

## 2022-04-14 DIAGNOSIS — L821 Other seborrheic keratosis: Secondary | ICD-10-CM | POA: Diagnosis not present

## 2022-04-14 DIAGNOSIS — L738 Other specified follicular disorders: Secondary | ICD-10-CM | POA: Diagnosis not present

## 2022-04-15 ENCOUNTER — Encounter: Payer: Self-pay | Admitting: Rehabilitative and Restorative Service Providers"

## 2022-04-15 ENCOUNTER — Ambulatory Visit (INDEPENDENT_AMBULATORY_CARE_PROVIDER_SITE_OTHER): Payer: PPO | Admitting: Rehabilitative and Restorative Service Providers"

## 2022-04-15 DIAGNOSIS — R293 Abnormal posture: Secondary | ICD-10-CM

## 2022-04-15 DIAGNOSIS — M5459 Other low back pain: Secondary | ICD-10-CM | POA: Diagnosis not present

## 2022-04-15 DIAGNOSIS — M542 Cervicalgia: Secondary | ICD-10-CM | POA: Diagnosis not present

## 2022-04-15 NOTE — Therapy (Signed)
OUTPATIENT PHYSICAL THERAPY TREATMENT/PROGRESS NOTE   Patient Name: Andrew Payne MRN: [id number] DOB:1946-02-04, 76 y.o., male Today's Date: 04/15/2022  END OF SESSION:  PT End of Session - 04/15/22 0930     Visit Number 4    Number of Visits 10    Progress Note Due on Visit 10    PT Start Time 0930    PT Stop Time 1017    PT Time Calculation (min) 47 min    Activity Tolerance Patient tolerated treatment well;No increased pain    Behavior During Therapy [facility] for tasks assessed/performed            Progress Note Reporting Period 03/22/2022 to 04/15/2022  See note below for Objective Data and Assessment of Progress/Goals.     Past Medical History:  Diagnosis Date   Bradycardia    cardiology-- dr Curt Bears--  work-up done included event monitor and echo (all in epic)   Carotid artery stenosis    per pt H&P dated 10-20-2018 --  <40%   Coronary atherosclerosis    Glaucoma, right eye    History of nuclear stress test 04/15/2005   (in epic)  for near syncope--- Low risk normal no ischemia, ef 57%   Hx of adenomatous colonic polyps 05/27/2016   Internal hemorrhoids    Morton neuroma, left    OA (osteoarthritis)    wrist, elbow, thumb, knees   PVC's (premature ventricular contractions)    RBBB (right bundle branch block)    States heart rate has been as low as 28 and asyptomatic   Past Surgical History:  Procedure Laterality Date   COLONOSCOPY  05/2016   TA   ELBOW SURGERY Left 04/2017   EXCISION MORTON'S NEUROMA Left 10/23/2018   Procedure: EXCISION MORTON'S NEUROMA LEFT FOOT;  Surgeon: Rosemary Holms, DPM;  Location: [place];  Service: Podiatry;  Laterality: Left;   HEMORRHOID BANDING  2018   INGUINAL HERNIA REPAIR Left 06/14/2017   Procedure: LEFT INGUINAL HERNIA REPAIR ERAS PATHWAY;  Surgeon: Erroll Luna, MD;  Location: [place];  Service: General;  Laterality: Left;   INGUINAL HERNIA REPAIR Right 11-18-2009   dr Redmond Pulling  _0     INSERTION OF MESH Left 06/14/2017   Procedure: INSERTION OF MESH;  Surgeon: Erroll Luna, MD;  Location: [facility];  Service: General;  Laterality: Left;   KNEE ARTHROSCOPY W/ MENISCAL REPAIR Right 09/2015   POLYPECTOMY     TONSILLECTOMY AND ADENOIDECTOMY  child   Patient Active Problem List   Diagnosis Date Noted   Impingement syndrome of left shoulder 02/11/2022   Multiple rib fractures 01/27/2022   Pneumothorax 01/27/2022   DOE (dyspnea on exertion) 12/30/2021   Nontraumatic incomplete tear of left rotator cuff 03/13/2020   Unilateral primary osteoarthritis, right knee 11/01/2017   Chest pain 08/07/2017   Bradycardia 08/07/2017   History of right tennis elbow 05/09/2017   Right tennis elbow 04/07/2017   Prolapsed internal hemorrhoids, grade 3 06/03/2016   Hx of adenomatous colonic polyps 05/27/2016    PCP: Ginger Organ, MD  REFERRING PROVIDER: Garald Balding, MD  REFERRING DIAG:  Diagnosis  M54.2 (ICD-10-CM) - Neck pain    THERAPY DIAG:  Abnormal posture  Cervicalgia  Other low back pain  Rationale for Evaluation and Treatment: Rehabilitation  ONSET DATE: January 06, 2022 fell from 14 feet from a deck.  SUBJECTIVE:  SUBJECTIVE STATEMENT: Andrew Payne notes progress since starting supervised physical therapy.  He still has pain with the same functions that he had at evaluation, although pain is less and function is better.  His main complaint is his "rib catching against the shoulder blade" when lowering his arm from overhead positions.  Left upper trapezius, scapula, low back and hip are most limited while his neck is making the fastest progress.  PERTINENT HISTORY:  Carotid artery stenosis, Lt Morton's neuroma surgery, Lt elbow surgery, hernia  repair, Rt knee scope, Lt RTC repair 2022, multiple rib fractures 12/2021  PAIN:  Are you having pain? Yes: NPRS scale: 0-6/10 Pain location: Neck, left upper trapezius, left scapula, ribs, right hip and low back Pain description: Ache, sharp/stabbing Aggravating factors: Walking, bending are getting a little better, reaching overhead has made the least progress Relieving factors: NA  PRECAUTIONS: Other: Multiple broken ribs are healing  WEIGHT BEARING RESTRICTIONS: No  FALLS:  Has patient fallen in last 6 months? Yes. Number of falls 1  LIVING ENVIRONMENT: Lives with: lives with their family and lives with their spouse Lives in: House/apartment Stairs:  No problems Has following equipment at home: None  OCCUPATION: Retired  PLOF: Independent  PATIENT GOALS: Return to house, yard chores, golf and normal function  NEXT MD VISIT: ?  OBJECTIVE:   DIAGNOSTIC FINDINGS:  Films of the cervical spine obtained in 2 projections.  There is very  minimal straightening of the normal cervical lordosis.  There are  degenerative changes beginning at C5 extending to C7 with narrowing of the  disc spaces and some anterior osteophytes.  Some mild facet sclerosis at  the same levels.  No listhesis or scoliosis  PATIENT SURVEYS:  04/15/2022 FOTO 72  03/22/2022 FOTO 72 (risk-adjusted 56) Goal 72  COGNITION: Overall cognitive status: Within functional limits for tasks assessed  SENSATION: No compliants of peripheral pain or paresthesias  POSTURE: rounded shoulders, forward head, and decreased lumbar lordosis  PALPATION: Significant for protracted left scapula   CERVICAL & LUMBAR ROM:   Active ROM A/PROM (deg) eval AROM 04/15/2022  Cervical Extension 70 70  Lumbar Extension 25 25  Right lumbar lateral flexion 40 45  Left lumbar lateral flexion 30 35  Right rotation 55 60  Left rotation 40 50  Lumbar Flexion 85 100   (Blank rows = not tested)  UPPER & LOWER EXTREMITY  ROM:  Passive ROM Right eval Left eval Left/Right 04/15/2022  Shoulder flexion 145 145 150/150  Shoulder extension     Shoulder abduction     Shoulder horizontal adduction 40 40 45/40  Shoulder extension     Shoulder internal rotation 45 60 50/40  Shoulder external rotation 90 80 80/10  Hip flexion 110 110 110/110  Hip ER 20 20 23/26  Hip IR 10 20 15/13  Hamstrings  40 35 40/35                       (Blank rows = not tested)  UPPER EXTREMITY STRENGTH:  In pounds with hand-held dynamometer Right 04/15/2022 Left 04/15/2022  Shoulder flexion    Shoulder extension    Shoulder abduction    Shoulder adduction    Shoulder extension    Shoulder internal rotation  48.2/41.3  Shoulder external rotation  28.4/29.2  Middle trapezius    Lower trapezius    Elbow flexion    Elbow extension    Wrist flexion    Wrist extension    Wrist ulnar  deviation    Wrist radial deviation    Wrist pronation    Wrist supination    Grip strength     (Blank rows = not tested)  Strength testing was deferred secondary to discomfort and because the variety of objective range measurements took a lot of time  TODAY'S TREATMENT:   DATE:  04/15/2022 Discussed Shoulder blade pinches and Shoulder blade pinches with shoulder hike  Lumbar extension active range of motion 10 x 3 seconds Supine scapular protraction 20X 3 seconds with 5# Supine shoulder flexion active range of motion with protraction first and palms facing in 10 x 10 seconds, last 5 with 2# bar Figure 4 stretch/hip external rotation stretch 4 x 20 seconds Rows with Blue theraband 20X 3 seconds Shoulder ER theraband Green 10X 3 seconds slow eccentrics Bil   04/08/2022 Shoulder blade pinches 10 x 5 seconds Shoulder blade pinches with shoulder hike 10 x 5 seconds, last 5 with a 10# weight in each hand Lumbar extension active range of motion 10 x 3 seconds Supine scapular protraction 20X 3 seconds with 5# Supine shoulder flexion  active range of motion with protraction first and palms facing in 10 x 10 seconds, last 5 with 3# bar Figure 4 stretch/hip external rotation stretch 4 x 20 seconds Prone T with thumbs up 10X 3 seconds (90 degrees) Rows with Blue theraband 20X 3 seconds Shoulder ER theraband Green 10X 3 seconds slow eccentrics Bil Chest stretch 15 seconds in doorframe Discussed theraband shoulder extension with palms facing forward   03/29/2022        UBE 3/3 fwd/ bkwd, while taking subjective.  Shoulder blade pinches 10 x 5 seconds Rows with BTB 2x10  Shoulder ext with BTB 2x10  90/90 ER with BTB 2x10  Bilat ER with BTB 2x10  Posterior GHJ mobs  Passive pec stretch  Muscle bending to pec Doorway pec stretch   Self mob with tennis ball on wall                                                                                                             PATIENT EDUCATION:  Education details: Reviewed exam findings and home exercise program Person educated: Patient Education method: Explanation, Demonstration, Tactile cues, Verbal cues, and Handouts Education comprehension: verbalized understanding, returned demonstration, verbal cues required, tactile cues required, and needs further education  HOME EXERCISE PROGRAM: Access Code: [email or web address] URL: [email or web address] Date: 04/08/2022 Prepared by: Vista Mink  Exercises - Standing Lumbar Extension at [place] 5 x daily - 7 x weekly - 1 sets - 5 reps - 3 seconds hold - Standing Scapular Retraction  - 5 x daily - 7 x weekly - 1 sets - 5 reps - 5 second hold - Supine Figure 4 Piriformis Stretch  - 2 x daily - 7 x weekly - 1 sets - 5 reps - 20 seconds hold - Supine Shoulder Flexion AAROM with Dowel  - 2 x daily - 7 x weekly - 1 sets - 5-10 reps - 5-10 seconds  hold - Standing Row with Anchored Resistance  - 1 x daily - 7 x weekly - 1 sets - 20 reps - Shoulder extension with resistance - Neutral  - 1 x daily - 7 x weekly - 1 sets  - 10 reps - 3 seconds hold - Shoulder External Rotation and Scapular Retraction with Resistance  - 1 x daily - 7 x weekly - 2 sets - 10 reps - 3 seconds hold - Doorway Pec Stretch at 60 Elevation  - 1 x daily - 7 x weekly - 1 sets - 10 reps - 10-15 seconds hold - Prone Shoulder Horizontal Abduction with Thumbs Up  - 1 x daily - 7 x weekly - 1-2 sets - 10 reps - 3 seconds hold - Supine Scapular Protraction in Flexion with Dumbbells  - 1 x daily - 7 x weekly - 1-2 sets - 20 reps - 3 seconds hold   ASSESSMENT:  CLINICAL IMPRESSION: Andrew Payne notes less pain vs evaluation.  He is still limited with his function, particularly with activities noted at evaluation is most limited while tying shoes has improved.  His HEP was progressed and updated today to continue his early progress with increased emphasis on improving overhead function.  I anticipate he will meet most objective and functional goals in January, 2024.   OBJECTIVE IMPAIRMENTS: Abnormal gait, decreased activity tolerance, decreased endurance, difficulty walking, decreased ROM, hypomobility, impaired perceived functional ability, impaired flexibility, impaired UE functional use, postural dysfunction, and pain.   ACTIVITY LIMITATIONS: carrying, lifting, bending, sleeping, and reach over head  PARTICIPATION LIMITATIONS: cleaning, community activity, and yard work  PERSONAL FACTORS:  Carotid artery stenosis, Lt Morton's neuroma surgery, Lt elbow surgery, hernia repair, Rt knee scope, Lt RTC repair 2022, multiple rib fractures 12/2021 are also affecting patient's functional outcome.   REHAB POTENTIAL: Good  CLINICAL DECISION MAKING: Evolving/moderate complexity  EVALUATION COMPLEXITY: Moderate   GOALS: Goals reviewed with patient? Yes  SHORT TERM GOALS: Target date: 04/20/2022  Improve bilateral shoulder flexion active range of motion to 160 degrees Baseline: 145 degrees Goal status: On Going 04/15/2022  2.  Improved bilateral hip  external rotation to 30 degrees Baseline: 20 degrees Goal status: On Going 04/15/2022  3.  Improve bilateral hamstrings flexibility to 45 degrees Baseline: 35 to 40 degrees Goal status: On Going 04/15/2022   LONG TERM GOALS: Target date: 05/17/2022  Improve FOTO to 72 Baseline: Risk adjusted 56 Goal status: On Going 04/15/2022  2.  Andrew Payne will report neck, upper trapezius, scapular, rib and low back pain consistently 0-3 out of 10 on the visual analog scale Baseline: 3-5 out of 10 Goal status: On Going 04/15/2022  3.  Improve bilateral shoulder flexion active range of motion to 170 degrees Baseline: 145 degrees Goal status: On Going 04/15/2022  4.  Improve bilateral hip external rotation active range of motion to 40 degrees Baseline: 20 degrees Goal status: On Going 04/15/2022  5.  Andrew Payne will be able to tie his shoes, reach overhead and sleep and sleep with no more than 3 out of 10 pain on visual analog scale Baseline: Unable to tie his shoes or reach overhead and he has difficulty sleeping Goal status: Partially Met 04/15/2022  6.  Andrew Payne will be independent with his long-term home exercise program at discharge Baseline: Started 03/22/2022 Goal status: On Going 04/15/2022   PLAN:  PT FREQUENCY: 1x/week  PT DURATION:  10 sessions  PLANNED INTERVENTIONS: Therapeutic exercises, Therapeutic activity, Neuromuscular re-education, Balance training, Gait training, Patient/Family  education, Self Care, Stair training, Cryotherapy, and Manual therapy  PLAN FOR NEXT SESSION: Review his updated home exercise program (ran out of time today) and appropriate progressions to assist with overhead function.   Farley Ly, PT, MPT 04/15/2022, 11:56 AM

## 2022-05-04 ENCOUNTER — Encounter: Payer: Self-pay | Admitting: Orthopaedic Surgery

## 2022-05-04 NOTE — Telephone Encounter (Signed)
Talked with patient concerning gel injection. VOB submitted for Orthovisc, right knee

## 2022-05-06 ENCOUNTER — Encounter: Payer: Self-pay | Admitting: Rehabilitative and Restorative Service Providers"

## 2022-05-06 ENCOUNTER — Ambulatory Visit: Payer: PPO | Admitting: Rehabilitative and Restorative Service Providers"

## 2022-05-06 DIAGNOSIS — M542 Cervicalgia: Secondary | ICD-10-CM

## 2022-05-06 DIAGNOSIS — M5459 Other low back pain: Secondary | ICD-10-CM

## 2022-05-06 DIAGNOSIS — R293 Abnormal posture: Secondary | ICD-10-CM

## 2022-05-06 NOTE — Therapy (Signed)
OUTPATIENT PHYSICAL THERAPY TREATMENT NOTE   Patient Name: Andrew Payne MRN: [id number] DOB:[birth year], 77 y.o., male Today's Date: 05/06/2022  END OF SESSION:  PT End of Session - 05/06/22 1120     Visit Number 5    Number of Visits 10    Progress Note Due on Visit 10    PT Start Time 6712    PT Stop Time 1018    PT Time Calculation (min) 47 min    Activity Tolerance Patient tolerated treatment well;No increased pain    Behavior During Therapy [facility] for tasks assessed/performed              Past Medical History:  Diagnosis Date   Bradycardia    cardiology-- dr Curt Bears--  work-up done included event monitor and echo (all in epic)   Carotid artery stenosis    per pt H&P dated 10-20-2018 --  <40%   Coronary atherosclerosis    Glaucoma, right eye    History of nuclear stress test 04/15/2005   (in epic)  for near syncope--- Low risk normal no ischemia, ef 57%   Hx of adenomatous colonic polyps 05/27/2016   Internal hemorrhoids    Morton neuroma, left    OA (osteoarthritis)    wrist, elbow, thumb, knees   PVC's (premature ventricular contractions)    RBBB (right bundle branch block)    States heart rate has been as low as 28 and asyptomatic   Past Surgical History:  Procedure Laterality Date   COLONOSCOPY  05/2016   TA   ELBOW SURGERY Left 04/2017   EXCISION MORTON'S NEUROMA Left 10/23/2018   Procedure: EXCISION MORTON'S NEUROMA LEFT FOOT;  Surgeon: Rosemary Holms, DPM;  Location: [place];  Service: Podiatry;  Laterality: Left;   HEMORRHOID BANDING  2018   INGUINAL HERNIA REPAIR Left 06/14/2017   Procedure: LEFT INGUINAL HERNIA REPAIR ERAS PATHWAY;  Surgeon: Erroll Luna, MD;  Location: [place];  Service: General;  Laterality: Left;   INGUINAL HERNIA REPAIR Right 11-18-2009   dr Redmond Pulling  '@[facility]'$    INSERTION OF MESH Left 06/14/2017   Procedure: INSERTION OF MESH;  Surgeon: Erroll Luna, MD;  Location: [place];   Service: General;  Laterality: Left;   KNEE ARTHROSCOPY W/ MENISCAL REPAIR Right 09/2015   POLYPECTOMY     TONSILLECTOMY AND ADENOIDECTOMY  child   Patient Active Problem List   Diagnosis Date Noted   Impingement syndrome of left shoulder 02/11/2022   Multiple rib fractures 01/27/2022   Pneumothorax 01/27/2022   DOE (dyspnea on exertion) 12/30/2021   Nontraumatic incomplete tear of left rotator cuff 03/13/2020   Unilateral primary osteoarthritis, right knee 11/01/2017   Chest pain 08/07/2017   Bradycardia 08/07/2017   History of right tennis elbow 05/09/2017   Right tennis elbow 04/07/2017   Prolapsed internal hemorrhoids, grade 3 06/03/2016   Hx of adenomatous colonic polyps 05/27/2016    PCP: Ginger Organ, MD  REFERRING PROVIDER: Garald Balding, MD  REFERRING DIAG:  Diagnosis  M54.2 (ICD-10-CM) - Neck pain    THERAPY DIAG:  Abnormal posture  Cervicalgia  Other low back pain  Rationale for Evaluation and Treatment: Rehabilitation  ONSET DATE: January 06, 2022 fell from 14 feet from a deck.  SUBJECTIVE:  SUBJECTIVE STATEMENT: Andrew Payne notes minimal progress since his last visit on 04/15/2022.  He still has pain with reaching and with right hip and low back pain in the morning.  His pain is less overall and function is better.  His main complaint remains "rib catching against the shoulder blade" when lowering his arm from overhead positions.  Lt scapular, Rt hip and low back symptoms are most limiting.  Rt lateral hip burning in the mornings.  Lasts until 30 minutes of activity have passed.  PERTINENT HISTORY:  Carotid artery stenosis, Lt Morton's neuroma surgery, Lt elbow surgery, hernia repair, Rt knee scope, Lt RTC repair 2022, multiple rib fractures  12/2021  PAIN:  Are you having pain? Yes: NPRS scale: 0-6/10 Pain location: Left scapula, ribs, right hip and low back Pain description: Ache, sharp/stabbing Aggravating factors: Walking, bending are getting a little better, reaching overhead has made the least progress Relieving factors: NA  PRECAUTIONS: Other: Multiple broken ribs are healing  WEIGHT BEARING RESTRICTIONS: No  FALLS:  Has patient fallen in last 6 months? Yes. Number of falls 1  LIVING ENVIRONMENT: Lives with: lives with their family and lives with their spouse Lives in: House/apartment Stairs:  No problems Has following equipment at home: None  OCCUPATION: Retired  PLOF: Independent  PATIENT GOALS: Return to house, yard chores, golf and normal function  NEXT MD VISIT: ?  OBJECTIVE:   DIAGNOSTIC FINDINGS:  Films of the cervical spine obtained in 2 projections.  There is very  minimal straightening of the normal cervical lordosis.  There are  degenerative changes beginning at C5 extending to C7 with narrowing of the  disc spaces and some anterior osteophytes.  Some mild facet sclerosis at  the same levels.  No listhesis or scoliosis  PATIENT SURVEYS:  04/15/2022 FOTO 72  03/22/2022 FOTO 72 (risk-adjusted 56) Goal 72  COGNITION: Overall cognitive status: Within functional limits for tasks assessed  SENSATION: No compliants of peripheral pain or paresthesias  POSTURE: rounded shoulders, forward head, and decreased lumbar lordosis  PALPATION: Significant for protracted left scapula   CERVICAL & LUMBAR ROM:        Active ROM A/PROM (deg) eval AROM 04/15/2022 AROM 05/06/2022  Cervical Extension 70 70 75  Lumbar Extension '25 25 25  '$ Right lumbar lateral flexion 40 45 45  Left lumbar lateral flexion 30 35 40  Right rotation 55 60 60  Left rotation 40 50 55  Lumbar Flexion 85 100    (Blank rows = not tested)  UPPER & LOWER EXTREMITY ROM:  Passive ROM Right eval Left eval Left/Right  04/15/2022 Left/ Right 05/06/2021  Shoulder flexion 145 145 150/150 155/155  Shoulder extension      Shoulder abduction      Shoulder horizontal adduction 40 40 45/40 40/40  Shoulder extension      Shoulder internal rotation 45 60 50/40 70/45  Shoulder external rotation 90 80 80/100 80/100  Hip flexion 110 110 110/110 115/110  Hip ER 20 20 23/26 25/28  Hip IR 10 20 15/13 20/14  Hamstrings  40 35 40/35 45/50                           (Blank rows = not tested)  UPPER EXTREMITY STRENGTH:  In pounds with hand-held dynamometer Right 04/15/2022 Left 04/15/2022 Left/Right 05/06/2022  Shoulder flexion     Shoulder extension     Shoulder abduction     Shoulder adduction  Shoulder extension     Shoulder internal rotation  48.2/41.3 53.9/47.6  Shoulder external rotation  28.4/29.2 28.9/32.1  Middle trapezius     Lower trapezius     Elbow flexion     Elbow extension     Wrist flexion     Wrist extension     Wrist ulnar deviation     Wrist radial deviation     Wrist pronation     Wrist supination     Grip strength      (Blank rows = not tested)  Strength testing was deferred secondary to discomfort and because the variety of objective range measurements took a lot of time  TODAY'S TREATMENT:   DATE:  05/05/2022 Prone alternating hip extensions 10X 3 seconds (add to HEP) Lumbar extension active range of motion 10 x 3 seconds Supine scapular protraction 20X 3 seconds with 6# Supine shoulder flexion active range of motion with protraction first and palms facing in 10 x 10 seconds Figure 4 stretch/hip external rotation stretch with addition of knee to chest 4 x 20 seconds Measurements and updating of HEP took most of today's visit   04/15/2022 Discussed Shoulder blade pinches and Shoulder blade pinches with shoulder hike  Lumbar extension active range of motion 10 x 3 seconds Supine scapular protraction 20X 3 seconds with 5# Supine shoulder flexion active range of motion  with protraction first and palms facing in 10 x 10 seconds, last 5 with 2# bar Figure 4 stretch/hip external rotation stretch 4 x 20 seconds Rows with Blue theraband 20X 3 seconds Shoulder ER theraband Green 10X 3 seconds slow eccentrics Bil   04/08/2022 Shoulder blade pinches 10 x 5 seconds Shoulder blade pinches with shoulder hike 10 x 5 seconds, last 5 with a 10# weight in each hand Lumbar extension active range of motion 10 x 3 seconds Supine scapular protraction 20X 3 seconds with 5# Supine shoulder flexion active range of motion with protraction first and palms facing in 10 x 10 seconds, last 5 with 3# bar Figure 4 stretch/hip external rotation stretch 4 x 20 seconds Prone T with thumbs up 10X 3 seconds (90 degrees) Rows with Blue theraband 20X 3 seconds Shoulder ER theraband Green 10X 3 seconds slow eccentrics Bil Chest stretch 15 seconds in doorframe Discussed theraband shoulder extension with palms facing forward                                                                    PATIENT EDUCATION:  Education details: Reviewed exam findings and home exercise program Person educated: Patient Education method: Explanation, Demonstration, Tactile cues, Verbal cues, and Handouts Education comprehension: verbalized understanding, returned demonstration, verbal cues required, tactile cues required, and needs further education  HOME EXERCISE PROGRAM: Access Code: [email or web address] URL: [email or web address] Date: 05/06/2022 Prepared by: Vista Mink  Exercises - Standing Lumbar Extension at [place]  - 1 x daily - 7 x weekly - 1 sets - 5 reps - 3 seconds hold - Standing Scapular Retraction  - 5 x daily - 7 x weekly - 1 sets - 5 reps - 5 second hold - Supine Figure 4 Piriformis Stretch  - 2-3 x daily - 7 x weekly - 1 sets - 5 reps - 20 seconds hold -  Supine Shoulder Flexion AAROM with [place]  - 2-3 x daily - 7 x weekly - 1 sets - 5-10 reps - 5-10 seconds hold -  Standing Row with Anchored Resistance  - 1 x daily - 5-7 x weekly - 1 sets - 20 reps - Shoulder extension with resistance - Neutral  - 1 x daily - 1 x weekly - 1 sets - 10 reps - 3 seconds hold - Shoulder External Rotation and Scapular Retraction with Resistance  - 1 x daily - 7 x weekly - 2 sets - 10 reps - 3 seconds hold - Doorway Pec Stretch at 60 Elevation  - 1 x daily - 1 x weekly - 1 sets - 10 reps - 10-15 seconds hold - Prone Shoulder Horizontal Abduction with Thumbs Up  - 1 x daily - 1 x weekly - 1-2 sets - 10 reps - 3 seconds hold - Supine Scapular Protraction in Flexion with Dumbbells  - 1 x daily - 7 x weekly - 1-2 sets - 20 reps - 3 seconds hold - Supine Hamstring Stretch  - 1-2 x daily - 1 x weekly - 1 sets - 5 reps - 20 seconds hold - Prone Hip Extension  - 1 x daily - 7 x weekly - 2-3 sets - 10 reps - 3-5 seconds hold  ASSESSMENT:  CLINICAL IMPRESSION: Andrew Payne is doing better than he was at evaluation.  He is frustrated with the perceived lack of progress over the Christmas/New Year's time frame.  Objectively, he continues to improve and his left scapular winging appears to be improving along with overhead range.  Left scapular winging, left shoulder ER:IR ratio imbalance (2:3 expected, currently closer to 1:2), right (Bil) hip ER AROM and low back/hip abductors strength will be the focus of continued work.   OBJECTIVE IMPAIRMENTS: Abnormal gait, decreased activity tolerance, decreased endurance, difficulty walking, decreased ROM, hypomobility, impaired perceived functional ability, impaired flexibility, impaired UE functional use, postural dysfunction, and pain.   ACTIVITY LIMITATIONS: carrying, lifting, bending, sleeping, and reach over head  PARTICIPATION LIMITATIONS: cleaning, community activity, and yard work  PERSONAL FACTORS:  Carotid artery stenosis, Lt Morton's neuroma surgery, Lt elbow surgery, hernia repair, Rt knee scope, Lt RTC repair 2022, multiple rib fractures 12/2021  are also affecting patient's functional outcome.   REHAB POTENTIAL: Good  CLINICAL DECISION MAKING: Evolving/moderate complexity  EVALUATION COMPLEXITY: Moderate   GOALS: Goals reviewed with patient? Yes  SHORT TERM GOALS: Target date: 04/20/2022  Improve bilateral shoulder flexion active range of motion to 160 degrees Baseline: 145 degrees Goal status: On Going 05/06/2022  2.  Improved bilateral hip external rotation to 30 degrees Baseline: 20 degrees Goal status: On Going 05/06/2022  3.  Improve bilateral hamstrings flexibility to 45 degrees Baseline: 35 to 40 degrees Goal status: Met 05/06/2022   LONG TERM GOALS: Target date: 05/17/2022  Improve FOTO to 72 Baseline: Risk adjusted 56 Goal status: On Going 04/15/2022  2.  Ponciano will report neck, upper trapezius, scapular, rib and low back pain consistently 0-3 out of 10 on the visual analog scale Baseline: 3-5 out of 10 Goal status: On Going 05/06/2022  3.  Improve bilateral shoulder flexion active range of motion to 170 degrees Baseline: 145 degrees Goal status: On Going 05/06/2022  4.  Improve bilateral hip external rotation active range of motion to 40 degrees Baseline: 20 degrees Goal status: On Going 05/06/2022  5.  Andrew Payne will be able to tie his shoes, reach overhead and sleep and sleep  with no more than 3 out of 10 pain on visual analog scale Baseline: Unable to tie his shoes or reach overhead and he has difficulty sleeping Goal status: Partially Met 05/06/2022  6.  Andrew Payne will be independent with his long-term home exercise program at discharge Baseline: Started 03/22/2022 Goal status: On Going 05/06/2022   PLAN:  PT FREQUENCY: 1x/week  PT DURATION:  10 sessions  PLANNED INTERVENTIONS: Therapeutic exercises, Therapeutic activity, Neuromuscular re-education, Balance training, Gait training, Patient/Family education, Self Care, Stair training, Cryotherapy, and Manual therapy  PLAN FOR NEXT SESSION: FOTO and  reassess Bil hip ER AROM, left shoulder ER:IR strength ratio and bilateral shoulder flexion AROM (impairments the last 2 visits).  Re-cert.  Add prone alternating hip extension and hip hike to HEP.   Farley Ly, PT, MPT 05/06/2022, 11:35 AM

## 2022-05-14 ENCOUNTER — Other Ambulatory Visit: Payer: Self-pay

## 2022-05-14 DIAGNOSIS — M1711 Unilateral primary osteoarthritis, right knee: Secondary | ICD-10-CM

## 2022-05-19 ENCOUNTER — Encounter: Payer: Self-pay | Admitting: Orthopaedic Surgery

## 2022-05-19 ENCOUNTER — Ambulatory Visit (INDEPENDENT_AMBULATORY_CARE_PROVIDER_SITE_OTHER): Payer: PPO | Admitting: Orthopaedic Surgery

## 2022-05-19 DIAGNOSIS — M1711 Unilateral primary osteoarthritis, right knee: Secondary | ICD-10-CM

## 2022-05-19 MED ORDER — HYALURONAN 30 MG/2ML IX SOSY
30.0000 mg | PREFILLED_SYRINGE | INTRA_ARTICULAR | Status: AC | PRN
  Administered 2022-05-19: 30 mg via INTRA_ARTICULAR

## 2022-05-19 NOTE — Progress Notes (Signed)
   Procedure Note  Patient: Andrew Payne             Date of Birth: Jun 07, 1945           MRN: [id number]             Visit Date: 05/19/2022  Procedures: Visit Diagnoses:  1. Unilateral primary osteoarthritis, right knee     Large Joint Inj: R knee on 05/19/2022 10:10 AM Indications: diagnostic evaluation and pain Details: 22 G 1.5 in needle, superolateral approach  Arthrogram: No  Medications: 30 mg Hyaluronan 30 MG/2ML Outcome: tolerated well, no immediate complications Procedure, treatment alternatives, risks and benefits explained, specific risks discussed. Consent was given by the patient. Immediately prior to procedure a time out was called to verify the correct patient, procedure, equipment, support staff and site/side marked as required. Patient was prepped and draped in the usual sterile fashion.     The patient is here for injection number one of the series of 3 Orthovisc injections in his right knee to treat the pain from osteoarthritis.  He has had a series of injections like this before and is fully aware of the risk and benefits of this type of injection.  He is a very active 77 year old gentleman.  In September of last year he had a 14 foot fall fracturing multiple ribs on both sides.  He was a patient of Dr. Durward Fortes who sent him to thoracic surgery and they said the plating would be too difficult of the rib fractures where they are related in terms of the posterior aspect of the ribs near the scapula.  That his biggest complaint is crepitation from mobility of the scapula and the ribs.  Is very irritating for him.  When I examined this area I can absolutely fill the crepitation between the scapular border and the ribs.  His right knee does have varus malalignment.  I was able to place Orthovisc No. 1 of a series of 3 Orthovisc in the right knee without difficulty.  Will see him back next week for #2 of the injections.  I will see if there is any other recommendations that I  could find as a relates to his ribs.  Lot #[id number]

## 2022-05-26 ENCOUNTER — Ambulatory Visit: Payer: PPO | Admitting: Orthopaedic Surgery

## 2022-05-26 ENCOUNTER — Encounter: Payer: Self-pay | Admitting: Orthopaedic Surgery

## 2022-05-26 DIAGNOSIS — M1711 Unilateral primary osteoarthritis, right knee: Secondary | ICD-10-CM

## 2022-05-26 MED ORDER — HYALURONAN 30 MG/2ML IX SOSY
30.0000 mg | PREFILLED_SYRINGE | INTRA_ARTICULAR | Status: AC | PRN
  Administered 2022-05-26: 30 mg via INTRA_ARTICULAR

## 2022-05-26 NOTE — Progress Notes (Signed)
   Procedure Note  Patient: Andrew Payne             Date of Birth: 02-19-1946           MRN: [id number]             Visit Date: 05/26/2022   HPI: Mr. Harmon Pier 77 year old male comes in today for his second of a series of 3 Orthovisc injections.  He states he had no reaction with the last injection.  He has known arthritis of the right knee.  He is still having crepitus from the scapula and rib region.  Dr. Delilah Shan did speak with one of the traumatologist about this and said that there was no surgical intervention.  Physical exam: General well-developed well-nourished male no acute distress ambulates without any assistive device. Right knee: No abnormal warmth erythema.  Overall good range of motion.  Procedures: Visit Diagnoses:  1. Unilateral primary osteoarthritis, right knee     Large Joint Inj: R knee on 05/26/2022 12:12 PM Indications: pain Details: 22 G 1.5 in needle, anterolateral approach  Arthrogram: No  Medications: 30 mg Hyaluronan 30 MG/2ML Outcome: tolerated well, no immediate complications Procedure, treatment alternatives, risks and benefits explained, specific risks discussed. Consent was given by the patient. Immediately prior to procedure a time out was called to verify the correct patient, procedure, equipment, support staff and site/side marked as required. Patient was prepped and draped in the usual sterile fashion.    Plan: He will follow-up with [place] in 1 week for his third series of 3 Orthovisc injections.  Patient tolerated injection well without any immediate adverse effects.

## 2022-06-07 ENCOUNTER — Ambulatory Visit (INDEPENDENT_AMBULATORY_CARE_PROVIDER_SITE_OTHER): Payer: PPO | Admitting: Orthopaedic Surgery

## 2022-06-07 ENCOUNTER — Encounter: Payer: Self-pay | Admitting: Orthopaedic Surgery

## 2022-06-07 DIAGNOSIS — M1711 Unilateral primary osteoarthritis, right knee: Secondary | ICD-10-CM

## 2022-06-07 MED ORDER — HYALURONAN 30 MG/2ML IX SOSY
30.0000 mg | PREFILLED_SYRINGE | INTRA_ARTICULAR | Status: AC | PRN
  Administered 2022-06-07: 30 mg via INTRA_ARTICULAR

## 2022-06-07 NOTE — Progress Notes (Signed)
   Procedure Note  Patient: Andrew Payne             Date of Birth: [birth year]           MRN: [id number]             Visit Date: 06/07/2022  Procedures: Visit Diagnoses:  1. Unilateral primary osteoarthritis, right knee     Large Joint Inj: R knee on 06/07/2022 10:26 AM Indications: diagnostic evaluation and pain Details: 22 G 1.5 in needle, superolateral approach  Arthrogram: No  Medications: 30 mg Hyaluronan 30 MG/2ML Outcome: tolerated well, no immediate complications Procedure, treatment alternatives, risks and benefits explained, specific risks discussed. Consent was given by the patient. Immediately prior to procedure a time out was called to verify the correct patient, procedure, equipment, support staff and site/side marked as required. Patient was prepped and draped in the usual sterile fashion.    The patient comes in today for injection #3 of a series of 3 Orthovisc injections in his knees to treat the pain from osteoarthritis.  He has done well than the other 2 injections.  His knee does have varus malalignment but no effusion.  The varus malalignment is correctable.  He has good ligament stability and good range of motion.  I did place Orthovisc No. 3 in his knee without difficulty.  Follow-up at this point is as needed.  All question concerns were answered and addressed.  Lot #[id number] Expiration date 10/24/2023

## 2022-06-09 NOTE — Therapy (Addendum)
OUTPATIENT PHYSICAL THERAPY DISCHARGE NOTE   Patient Name: Andrew Payne MRN: [id number] DOB:December 26, 1945, 77 y.o., male Today's Date: 06/10/2022  END OF SESSION:  PT End of Session - 06/10/22 0954     Visit Number 6    Number of Visits 10    Progress Note Due on Visit 10    PT Start Time [id number]    PT Stop Time 1000    PT Time Calculation (min) 24 min    Activity Tolerance Patient tolerated treatment well    Behavior During Therapy [facility] for tasks assessed/performed               Past Medical History:  Diagnosis Date   Bradycardia    cardiology-- dr Curt Bears--  work-up done included event monitor and echo (all in epic)   Carotid artery stenosis    per pt H&P dated 10-20-2018 --  <40%   Coronary atherosclerosis    Glaucoma, right eye    History of nuclear stress test 04/15/2005   (in epic)  for near syncope--- Low risk normal no ischemia, ef 57%   Hx of adenomatous colonic polyps 05/27/2016   Internal hemorrhoids    Morton neuroma, left    OA (osteoarthritis)    wrist, elbow, thumb, knees   PVC's (premature ventricular contractions)    RBBB (right bundle branch block)    States heart rate has been as low as 28 and asyptomatic   Past Surgical History:  Procedure Laterality Date   COLONOSCOPY  05/2016   TA   ELBOW SURGERY Left 04/2017   EXCISION MORTON'S NEUROMA Left 10/23/2018   Procedure: EXCISION MORTON'S NEUROMA LEFT FOOT;  Surgeon: Rosemary Holms, DPM;  Location: [place];  Service: Podiatry;  Laterality: Left;   HEMORRHOID BANDING  2018   INGUINAL HERNIA REPAIR Left 06/14/2017   Procedure: LEFT INGUINAL HERNIA REPAIR ERAS PATHWAY;  Surgeon: Erroll Luna, MD;  Location: [place];  Service: General;  Laterality: Left;   INGUINAL HERNIA REPAIR Right 11-18-2009   dr Redmond Pulling  @[facility]$    INSERTION OF MESH Left 06/14/2017   Procedure: INSERTION OF MESH;  Surgeon: Erroll Luna, MD;  Location: [place];  Service: General;   Laterality: Left;   KNEE ARTHROSCOPY W/ MENISCAL REPAIR Right 09/2015   POLYPECTOMY     TONSILLECTOMY AND ADENOIDECTOMY  child   Patient Active Problem List   Diagnosis Date Noted   Impingement syndrome of left shoulder 02/11/2022   Multiple rib fractures 01/27/2022   Pneumothorax 01/27/2022   DOE (dyspnea on exertion) 12/30/2021   Nontraumatic incomplete tear of left rotator cuff 03/13/2020   Unilateral primary osteoarthritis, right knee 11/01/2017   Chest pain 08/07/2017   Bradycardia 08/07/2017   History of right tennis elbow 05/09/2017   Right tennis elbow 04/07/2017   Prolapsed internal hemorrhoids, grade 3 06/03/2016   Hx of adenomatous colonic polyps 05/27/2016    PCP: Ginger Organ, MD  REFERRING PROVIDER: Garald Balding, MD  REFERRING DIAG:  Diagnosis  M54.2 (ICD-10-CM) - Neck pain    THERAPY DIAG:  Abnormal posture  Cervicalgia  Other low back pain  Rationale for Evaluation and Treatment: Rehabilitation  ONSET DATE: January 06, 2022 fell from 14 feet from a deck.  SUBJECTIVE:  SUBJECTIVE STATEMENT: Pt states that he has made 60-70% improvements in pain. He continues to have where the rib is protruding but notes it mainly with OH and reaching movements at this time. He is requesting to stop PT at this time due to plateau in progress.   PERTINENT HISTORY:  Carotid artery stenosis, Lt Morton's neuroma surgery, Lt elbow surgery, hernia repair, Rt knee scope, Lt RTC repair 2022, multiple rib fractures 12/2021  PAIN:  Are you having pain? Yes: NPRS scale: 0-6/10 Pain location: Left scapula, ribs, right hip and low back Pain description: Ache, sharp/stabbing Aggravating factors: Walking, bending are getting a little better, reaching overhead has made the  least progress Relieving factors: NA  PRECAUTIONS: Other: Multiple broken ribs are healing  WEIGHT BEARING RESTRICTIONS: No  FALLS:  Has patient fallen in last 6 months? Yes. Number of falls 1  LIVING ENVIRONMENT: Lives with: lives with their family and lives with their spouse Lives in: House/apartment Stairs:  No problems Has following equipment at home: None  OCCUPATION: Retired  PLOF: Independent  PATIENT GOALS: Return to house, yard chores, golf and normal function  NEXT MD VISIT: ?  OBJECTIVE:   DIAGNOSTIC FINDINGS:  Films of the cervical spine obtained in 2 projections.  There is very  minimal straightening of the normal cervical lordosis.  There are  degenerative changes beginning at C5 extending to C7 with narrowing of the  disc spaces and some anterior osteophytes.  Some mild facet sclerosis at  the same levels.  No listhesis or scoliosis  PATIENT SURVEYS:  04/15/2022 FOTO 72  03/22/2022 FOTO 72 (risk-adjusted 56) Goal 72  COGNITION: Overall cognitive status: Within functional limits for tasks assessed  SENSATION: No compliants of peripheral pain or paresthesias  POSTURE: rounded shoulders, forward head, and decreased lumbar lordosis  PALPATION: Significant for protracted left scapula   CERVICAL & LUMBAR ROM:        Active ROM A/PROM (deg) eval AROM 04/15/2022 AROM 05/06/2022  Cervical Extension 70 70 75  Lumbar Extension 25 25 25  $ Right lumbar lateral flexion 40 45 45  Left lumbar lateral flexion 30 35 40  Right rotation 55 60 60  Left rotation 40 50 55  Lumbar Flexion 85 100    (Blank rows = not tested)  UPPER & LOWER EXTREMITY ROM:  Passive ROM Right eval Left eval Left/Right 04/15/2022 Left/ Right 05/06/2021 AROM L/R  06/10/2022  Shoulder flexion 145 145 150/150 155/155 161/ 161  Shoulder extension       Shoulder abduction       Shoulder horizontal adduction 40 40 45/40 40/40   Shoulder extension       Shoulder internal rotation 45  60 50/40 70/45 70/70 $  Shoulder external rotation 90 80 80/100 80/100 90/90  Hip flexion 110 110 110/110 115/110 WFL  Hip ER 20 20 23/26 25/28 WFL  Hip IR 10 20 15/13 20/14 WFL  Hamstrings  40 35 40/35 45/50                                (Blank rows = not tested)  UPPER EXTREMITY STRENGTH:  In pounds with hand-held dynamometer Right 04/15/2022 Left 04/15/2022 Left/Right 05/06/2022 L/R  06/10/2022  Shoulder flexion      Shoulder extension      Shoulder abduction      Shoulder adduction      Shoulder extension      Shoulder internal rotation  48.2/41.3 53.9/47.6 56.8/48.7  Shoulder external rotation  28.4/29.2 28.9/32.1 24.4/29.5  Middle trapezius      Lower trapezius      Elbow flexion      Elbow extension      Wrist flexion      Wrist extension      Wrist ulnar deviation      Wrist radial deviation      Wrist pronation      Wrist supination      Grip strength       (Blank rows = not tested)  Strength testing was deferred secondary to discomfort and because the variety of objective range measurements took a lot of time TODAY'S TREATMENT:   DATE:  06/10/2022 Spent approx 10 min educating patient on anatomy of the shoulder. Explained the GHJ open packed position with exercises to help eliminate pain with movements at the gym. Pt acknowledges understanding.  Reviewed HEP and goals for d/c today per pt request.   TODAY'S TREATMENT:   DATE:  05/05/2022 Prone alternating hip extensions 10X 3 seconds (add to HEP) Lumbar extension active range of motion 10 x 3 seconds Supine scapular protraction 20X 3 seconds with 6# Supine shoulder flexion active range of motion with protraction first and palms facing in 10 x 10 seconds Figure 4 stretch/hip external rotation stretch with addition of knee to chest 4 x 20 seconds Measurements and updating of HEP took most of today's visit   04/15/2022 Discussed Shoulder blade pinches and Shoulder blade pinches with shoulder hike  Lumbar  extension active range of motion 10 x 3 seconds Supine scapular protraction 20X 3 seconds with 5# Supine shoulder flexion active range of motion with protraction first and palms facing in 10 x 10 seconds, last 5 with 2# bar Figure 4 stretch/hip external rotation stretch 4 x 20 seconds Rows with Blue theraband 20X 3 seconds Shoulder ER theraband Green 10X 3 seconds slow eccentrics Bil   04/08/2022 Shoulder blade pinches 10 x 5 seconds Shoulder blade pinches with shoulder hike 10 x 5 seconds, last 5 with a 10# weight in each hand Lumbar extension active range of motion 10 x 3 seconds Supine scapular protraction 20X 3 seconds with 5# Supine shoulder flexion active range of motion with protraction first and palms facing in 10 x 10 seconds, last 5 with 3# bar Figure 4 stretch/hip external rotation stretch 4 x 20 seconds Prone T with thumbs up 10X 3 seconds (90 degrees) Rows with Blue theraband 20X 3 seconds Shoulder ER theraband Green 10X 3 seconds slow eccentrics Bil Chest stretch 15 seconds in doorframe Discussed theraband shoulder extension with palms facing forward                                                                    PATIENT EDUCATION:  Education details: Reviewed exam findings and home exercise program Person educated: Patient Education method: Explanation, Demonstration, Tactile cues, Verbal cues, and Handouts Education comprehension: verbalized understanding, returned demonstration, verbal cues required, tactile cues required, and needs further education  HOME EXERCISE PROGRAM: Access Code: [email or web address] URL: [email or web address] Date: 05/06/2022 Prepared by: Vista Mink  Exercises - Standing Lumbar Extension at [place] 1 x daily - 7 x weekly - 1 sets - 5  reps - 3 seconds hold - Standing Scapular Retraction  - 5 x daily - 7 x weekly - 1 sets - 5 reps - 5 second hold - Supine Figure 4 Piriformis Stretch  - 2-3 x daily - 7 x weekly - 1 sets -  5 reps - 20 seconds hold - Supine Shoulder Flexion AAROM with Dowel  - 2-3 x daily - 7 x weekly - 1 sets - 5-10 reps - 5-10 seconds hold - Standing Row with Anchored Resistance  - 1 x daily - 5-7 x weekly - 1 sets - 20 reps - Shoulder extension with resistance - Neutral  - 1 x daily - 1 x weekly - 1 sets - 10 reps - 3 seconds hold - Shoulder External Rotation and Scapular Retraction with Resistance  - 1 x daily - 7 x weekly - 2 sets - 10 reps - 3 seconds hold - Doorway Pec Stretch at 60 Elevation  - 1 x daily - 1 x weekly - 1 sets - 10 reps - 10-15 seconds hold - Prone Shoulder Horizontal Abduction with Thumbs Up  - 1 x daily - 1 x weekly - 1-2 sets - 10 reps - 3 seconds hold - Supine Scapular Protraction in Flexion with Dumbbells  - 1 x daily - 7 x weekly - 1-2 sets - 20 reps - 3 seconds hold - Supine Hamstring Stretch  - 1-2 x daily - 1 x weekly - 1 sets - 5 reps - 20 seconds hold - Prone Hip Extension  - 1 x daily - 7 x weekly - 2-3 sets - 10 reps - 3-5 seconds hold  ASSESSMENT:  CLINICAL IMPRESSION: MALKIEL FLAGG has progressed well with therapy.  Improved impairments include: Overall pain and mobility of his neck and L shoulder.  Functional improvements include: OH reaching, lifting, and carrying. Barriers to progress include: Displacement of rib resulting in residual pain with certain movements; OH reaches, abduction and reaching forward.  Pt reports no pain in his cervical spine anymore, he notes 60-70% improvements overall since starting PT. Pt is now able to perform all functional activities required of him and has returned to the gym with modifications of exercises as needed. After further discussion with his MD, pt is requesting to be discharged to a comprehensive HEP. He has functional ROM and strength and has met all his goals with the exception of his rib pain at this time. Please see GOALS section for progress on short term and long term goals established at evaluation.  I recommend D/C  home with HEP; pt agrees with plan.    OBJECTIVE IMPAIRMENTS: Abnormal gait, decreased activity tolerance, decreased endurance, difficulty walking, decreased ROM, hypomobility, impaired perceived functional ability, impaired flexibility, impaired UE functional use, postural dysfunction, and pain.   ACTIVITY LIMITATIONS: carrying, lifting, bending, sleeping, and reach over head  PARTICIPATION LIMITATIONS: cleaning, community activity, and yard work  PERSONAL FACTORS:  Carotid artery stenosis, Lt Morton's neuroma surgery, Lt elbow surgery, hernia repair, Rt knee scope, Lt RTC repair 2022, multiple rib fractures 12/2021 are also affecting patient's functional outcome.   REHAB POTENTIAL: Good  CLINICAL DECISION MAKING: Evolving/moderate complexity  EVALUATION COMPLEXITY: Moderate   GOALS: Goals reviewed with patient? Yes  SHORT TERM GOALS: Target date: 04/20/2022  Improve bilateral shoulder flexion active range of motion to 160 degrees Baseline: 145 degrees Goal status: MET 06/10/2022  2.  Improved bilateral hip external rotation to 30 degrees Baseline: 20 degrees Goal status: MET 06/10/2022  3.  Improve bilateral hamstrings flexibility to 45 degrees Baseline: 35 to 40 degrees Goal status: Met 05/06/2022   LONG TERM GOALS: Target date: 05/17/2022  Improve FOTO to 72 Baseline: Risk adjusted 56 Goal status: MET 06/10/2022  2.  Rupesh will report neck, upper trapezius, scapular, rib and low back pain consistently 0-3 out of 10 on the visual analog scale Baseline: 3-5 out of 10 Goal status: 4/10 at max. When it catches at a 2/10   3.  Improve bilateral shoulder flexion active range of motion to 170 degrees Baseline: 145 degrees Goal status: On Going 05/06/2022  4.  Improve bilateral hip external rotation active range of motion to 40 degrees Baseline: 20 degrees Goal status: MET 06/10/2022  5.  [name] will be able to tie his shoes, reach overhead and sleep and sleep with no  more than 3 out of 10 pain on visual analog scale Baseline: Unable to tie his shoes or reach overhead and he has difficulty sleeping Goal status: Partially Met 06/10/2022  6.  [name] will be independent with his long-term home exercise program at discharge Baseline: Started 03/22/2022 Goal status: MET 06/10/2022    PHYSICAL THERAPY DISCHARGE SUMMARY  Visits from Start of Care: 6  Current functional level related to goals / functional outcomes: Reduced pain. Improved functional mobility with OH reaching, lifting and carrying.    Remaining deficits: Rib pain with abduction, end range flexion and reaching.    Education / Equipment: HEP, theraband's.    Patient agrees to discharge. Patient goals were partially met. Patient is being discharged due to the patient's request.    Lynden Ang, PT, MPT 06/10/2022, 10:20 AM

## 2022-06-10 ENCOUNTER — Ambulatory Visit: Payer: PPO | Admitting: Physical Therapy

## 2022-06-10 DIAGNOSIS — M542 Cervicalgia: Secondary | ICD-10-CM

## 2022-06-10 DIAGNOSIS — M5459 Other low back pain: Secondary | ICD-10-CM

## 2022-06-10 DIAGNOSIS — R293 Abnormal posture: Secondary | ICD-10-CM | POA: Diagnosis not present

## 2022-07-16 DIAGNOSIS — L820 Inflamed seborrheic keratosis: Secondary | ICD-10-CM | POA: Diagnosis not present

## 2022-07-16 DIAGNOSIS — Z85828 Personal history of other malignant neoplasm of skin: Secondary | ICD-10-CM | POA: Diagnosis not present

## 2022-07-16 DIAGNOSIS — L570 Actinic keratosis: Secondary | ICD-10-CM | POA: Diagnosis not present

## 2022-07-20 ENCOUNTER — Other Ambulatory Visit: Payer: Self-pay

## 2022-07-20 ENCOUNTER — Ambulatory Visit (INDEPENDENT_AMBULATORY_CARE_PROVIDER_SITE_OTHER): Payer: PPO | Admitting: Orthopaedic Surgery

## 2022-07-20 ENCOUNTER — Encounter: Payer: Self-pay | Admitting: Orthopaedic Surgery

## 2022-07-20 DIAGNOSIS — S2243XD Multiple fractures of ribs, bilateral, subsequent encounter for fracture with routine healing: Secondary | ICD-10-CM

## 2022-07-20 DIAGNOSIS — R0781 Pleurodynia: Secondary | ICD-10-CM

## 2022-07-20 NOTE — Progress Notes (Signed)
The patient is now 6 months out from a significant trauma in which she fell off a ladder significant amount of feet and fractured multiple ribs on the left side.  [place] to a trauma center in [place].  He eventually was referred to thoracic surgery who did not recommend plating of the ribs but he has significant multiple rib fractures on the left side.  He does get still some catching in these areas in the feels like there is a distinct difference on the ribs on the left side specially the scapular blade.  Have seen these before as well at the scapular body.  He feels like there is still some motion of the fracture itself and is causing sensation is not getting any better for him.  On exam there is some slight winging of the scapula on the left side when comparing the left and right side.  His shoulder exam is normal but he does have pain palpating the ribs on the left side.  This point a CT scan is warranted of the chest to assess the fractures and to see about the potential for nonunion.  We can then go from there in terms of determining if he needs referral to a tertiary care center to either orthopedic trauma or even CT surgery to evaluate his fractures.  However, it is appropriate that we obtain the CT scan first to further assess his fractures.

## 2022-07-21 ENCOUNTER — Ambulatory Visit: Payer: PPO | Admitting: Orthopaedic Surgery

## 2022-07-22 ENCOUNTER — Telehealth: Payer: Self-pay | Admitting: Orthopaedic Surgery

## 2022-07-22 ENCOUNTER — Other Ambulatory Visit: Payer: Self-pay | Admitting: Orthopaedic Surgery

## 2022-07-22 DIAGNOSIS — R0781 Pleurodynia: Secondary | ICD-10-CM

## 2022-07-22 NOTE — Telephone Encounter (Signed)
He can call [facility] back to schedule- insurance did not require authorization

## 2022-07-22 NOTE — Telephone Encounter (Signed)
Patient aware to call them

## 2022-07-22 NOTE — Telephone Encounter (Signed)
Waiting on CT scan approval. Please advise. He has a follow up app. On 07/28/2022

## 2022-07-26 ENCOUNTER — Ambulatory Visit
Admission: RE | Admit: 2022-07-26 | Discharge: 2022-07-26 | Disposition: A | Discharge status: Home / Self Care | Payer: PPO | Source: Ambulatory Visit | Attending: Orthopaedic Surgery | Admitting: Orthopaedic Surgery

## 2022-07-26 DIAGNOSIS — R0781 Pleurodynia: Secondary | ICD-10-CM

## 2022-07-26 DIAGNOSIS — S2243XA Multiple fractures of ribs, bilateral, initial encounter for closed fracture: Secondary | ICD-10-CM | POA: Diagnosis not present

## 2022-07-26 DIAGNOSIS — I700 Atherosclerosis of aorta: Secondary | ICD-10-CM | POA: Diagnosis not present

## 2022-07-28 ENCOUNTER — Ambulatory Visit (INDEPENDENT_AMBULATORY_CARE_PROVIDER_SITE_OTHER): Payer: PPO | Admitting: Orthopaedic Surgery

## 2022-07-28 ENCOUNTER — Other Ambulatory Visit: Payer: Self-pay

## 2022-07-28 DIAGNOSIS — S2242XK Multiple fractures of ribs, left side, subsequent encounter for fracture with nonunion: Secondary | ICD-10-CM | POA: Diagnosis not present

## 2022-07-28 NOTE — Progress Notes (Signed)
The patient is a 77 year old active gentleman who we are seeing in follow-up after having a CT scan with reconstructions of his chest to assess the healing of rib fractures that occurred over 6 months ago.  He had a very hard mechanical fall from a height and his biggest issue are having left-sided rib fractures that are near the scapular border that are quite symptomatic still in terms of pain and rubbing sensation.  He is having no breathing difficulties.  He is very active and we did recently place hyaluronic acid injections into his knee.  He would like to play golf again at some point.  He does walk 5 miles a day and stays healthy.  He is a thin individual as well.  On exam there is deftly prominence of the ribs at the mid thoracic to lower thoracic area to the left side at the border of the scapula.  The CT scan with recons is reviewed with him.  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  All of the other rib fractures on the left and right side which are multiple of all healed.  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  This is confirmed with a CT scan and clinical exam findings.  He has tried and failed other forms conservative treatment.  We can also refer him to CT surgery at a tertiary care center such as [facility] because they may have more experience with plating of the ribs and they can let him know if this is warranted in this situation at all.  We will work on making that referral and I recommended that he go by the imaging center to pick up a disc that has a 3D reconstructions on the disc.  We will work on these referrals.  He agrees with the treatment plan.

## 2022-07-29 ENCOUNTER — Other Ambulatory Visit: Payer: Self-pay

## 2022-07-29 DIAGNOSIS — R0781 Pleurodynia: Secondary | ICD-10-CM

## 2022-07-29 DIAGNOSIS — S2242XK Multiple fractures of ribs, left side, subsequent encounter for fracture with nonunion: Secondary | ICD-10-CM

## 2022-08-04 DIAGNOSIS — S2242XK Multiple fractures of ribs, left side, subsequent encounter for fracture with nonunion: Secondary | ICD-10-CM | POA: Diagnosis not present

## 2022-08-08 ENCOUNTER — Encounter: Payer: Self-pay | Admitting: Orthopaedic Surgery

## 2022-08-09 ENCOUNTER — Telehealth: Payer: Self-pay | Admitting: Orthopaedic Surgery

## 2022-08-09 NOTE — Telephone Encounter (Signed)
Patient wants to be seen Wednesday and wants a call

## 2022-09-03 NOTE — Progress Notes (Unsigned)
  Electrophysiology Office Note:   Date:  09/06/2022  ID:  Andrew Payne, DOB [birth year], MRN [id number]  Primary Cardiologist: Orbie Pyo, MD Electrophysiologist: Regan Lemming, MD   History of Present Illness:   Andrew Payne is a 77 y.o. male with h/o SSS, mild carotid stenosis, and PVCs seen today for routine electrophysiology followup. Since last being seen in our clinic the patient reports doing very well. He exercises most days and has felt much better as he has continued to heal from his traumatic fall in autumn.  He has noted less fatigue when exercising. Walks up to 5 miles and gets his HRs up into the 100-110s. he denies chest pain, palpitations, dyspnea, PND, orthopnea, nausea, vomiting, dizziness, syncope, edema, weight gain, or early satiety.   Review of systems complete and found to be negative unless listed in HPI.   Studies Reviewed:    EKG is ordered today. Personal review shows Sinus bradycardia at 48 bpm with a PVC  with stable intervals on flecainide.   Physical Exam:   VS:  BP 104/70   Pulse (!) 48   Ht 6\' 2"  (1.88 m)   Wt 184 lb 3.2 oz (83.6 kg)   SpO2 98%   BMI 23.65 kg/m    Wt Readings from Last 3 Encounters:  09/06/22 184 lb 3.2 oz (83.6 kg)  03/03/22 182 lb 9.6 oz (82.8 kg)  02/04/22 181 lb 2.2 oz (82.2 kg)     GEN: Well nourished, well developed in no acute distress NECK: No JVD; No carotid bruits CARDIAC: Regular rate and rhythm with occasional ectopy, no murmurs, rubs, gallops RESPIRATORY:  Clear to auscultation without rales, wheezing or rhonchi  ABDOMEN: Soft, non-tender, non-distended EXTREMITIES:  No edema; No deformity   ASSESSMENT AND PLAN:    SSS EKG today shows sinus bradycardia at 48 bpm with a PVC Resting bradycardia but able to increase HR on treadmill No urgent indication for pacing.  Continue to monitor HRs and symptoms closely.   PVCs Monitoring very closely on flecainide 50 mg BID given SSS  Follow up with Dr.  Elberta Fortis in 6 months  Signed, Graciella Freer, [name]

## 2022-09-06 ENCOUNTER — Encounter: Payer: Self-pay | Admitting: Student

## 2022-09-06 ENCOUNTER — Ambulatory Visit: Payer: PPO | Attending: Student | Admitting: Student

## 2022-09-06 VITALS — BP 104/70 | HR 48 | Ht 74.0 in | Wt 184.2 lb

## 2022-09-06 DIAGNOSIS — I495 Sick sinus syndrome: Secondary | ICD-10-CM | POA: Diagnosis not present

## 2022-09-06 DIAGNOSIS — I493 Ventricular premature depolarization: Secondary | ICD-10-CM

## 2022-09-06 NOTE — Patient Instructions (Signed)
Medication Instructions:  Your physician recommends that you continue on your current medications as directed. Please refer to the Current Medication list given to you today.  *If you need a refill on your cardiac medications before your next appointment, please call your pharmacy*  Lab Work: None ordered If you have labs (blood work) drawn today and your tests are completely normal, you will receive your results only by: MyChart Message (if you have MyChart) OR A paper copy in the mail If you have any lab test that is abnormal or we need to change your treatment, we will call you to review the results.  Follow-Up: At [facility], you and your health needs are our priority.  As part of our continuing mission to provide you with exceptional heart care, we have created designated Provider Care Teams.  These Care Teams include your primary Cardiologist (physician) and Advanced Practice Providers (APPs -  Physician Assistants and Nurse Practitioners) who all work together to provide you with the care you need, when you need it.  Your next appointment:   6 month(s)  Provider:   Will Camnitz, MD  

## 2022-09-13 ENCOUNTER — Encounter: Payer: Self-pay | Admitting: Orthopaedic Surgery

## 2022-09-13 DIAGNOSIS — S2239XG Fracture of one rib, unspecified side, subsequent encounter for fracture with delayed healing: Secondary | ICD-10-CM | POA: Diagnosis not present

## 2022-09-15 DIAGNOSIS — H401131 Primary open-angle glaucoma, bilateral, mild stage: Secondary | ICD-10-CM | POA: Diagnosis not present

## 2022-09-15 DIAGNOSIS — H2513 Age-related nuclear cataract, bilateral: Secondary | ICD-10-CM | POA: Diagnosis not present

## 2022-09-16 ENCOUNTER — Telehealth: Payer: Self-pay

## 2022-09-16 NOTE — Telephone Encounter (Signed)
   Pre-operative Risk Assessment    Patient Name: Andrew Payne  DOB: [birth year] MRN: [id number]      Request for Surgical Clearance    Procedure:   Thoracic Surgery,  VATs Rib Plating non union left possible Thoracotomy 7th & 8th   Date of Surgery:  Clearance 10/18/22                                 Surgeon:  Grayland Ormond, MD  Surgeon's Group or Practice Name:  [facility]   Phone number:  [phone] Fax number:  [phone]   Type of Clearance Requested:   - Medical  - Pharmacy:  Hold Aspirin     Type of Anesthesia:  Not Indicated "General?"   Additional requests/questions:   N/A  Berneda Rose   09/16/2022, 2:59 PM

## 2022-09-16 NOTE — Telephone Encounter (Signed)
Andrew Payne  We have received a surgical clearance request for Andrew Payne who is scheduled to undergo a VATS on 10/18/2022.. They were seen recently in clinic by you on 09/06/2022. Can you please comment on surgical clearance. Please forward you guidance and recommendations to P CV DIV PREOP

## 2022-09-17 NOTE — Telephone Encounter (Signed)
   Patient Name: Andrew Payne  DOB: Mar 03, 1946 MRN: [id number]  Primary Cardiologist: Orbie Pyo, MD  Chart reviewed as part of pre-operative protocol coverage. Given past medical history and time since last visit, based on ACC/AHA guidelines, Andrew Payne is at acceptable risk for the planned procedure without further cardiovascular testing.  Patient has a RCRI score of 0.4% and was able to complete 4 METS of activity at previous follow-up visit.  The patient was advised that if he develops new symptoms prior to surgery to contact our office to arrange for a follow-up visit, and he verbalized understanding.  Regarding ASA therapy, we recommend continuation of ASA throughout the perioperative period.  However, if the surgeon feels that cessation of ASA is required in the perioperative period, it may be stopped 5-7 days prior to surgery with a plan to resume it as soon as felt to be feasible from a surgical standpoint in the post-operative period.   I will route this recommendation to the requesting party via Epic fax function and remove from pre-op pool.  Please call with questions.  Napoleon Form, Leodis Rains, NP 09/17/2022, 8:26 AM

## 2022-09-21 DIAGNOSIS — K4090 Unilateral inguinal hernia, without obstruction or gangrene, not specified as recurrent: Secondary | ICD-10-CM | POA: Diagnosis not present

## 2022-09-21 DIAGNOSIS — R1031 Right lower quadrant pain: Secondary | ICD-10-CM | POA: Diagnosis not present

## 2022-10-12 DIAGNOSIS — K4090 Unilateral inguinal hernia, without obstruction or gangrene, not specified as recurrent: Secondary | ICD-10-CM | POA: Diagnosis not present

## 2022-10-13 ENCOUNTER — Telehealth: Payer: Self-pay | Admitting: *Deleted

## 2022-10-13 ENCOUNTER — Other Ambulatory Visit: Payer: Self-pay | Admitting: Internal Medicine

## 2022-10-13 NOTE — Telephone Encounter (Signed)
Pt contacted regarding surgical clearance.  Pt already scheduled to see Robin Searing, NP, 11/02/22, as pt will be out of town until 11/01/22.  Clearance will be addressed at that time.  Will route to the requesting surgeon's office to make them aware.

## 2022-10-13 NOTE — Telephone Encounter (Signed)
   Name: Andrew Payne  DOB: [birth year]  MRN: [id number]  Primary Cardiologist: Orbie Pyo, MD   Preoperative team, please contact this patient and set up a phone call appointment for further preoperative risk assessment. Please obtain consent and complete medication review. Thank you for your help.  I confirm that guidance regarding antiplatelet and oral anticoagulation therapy has been completed and, if necessary, noted below.  Per office protocol, if patient is without any new symptoms or concerns at the time of their virtual visit, he may hold Aspirin for 5-7 days prior to procedure. Please resume Aspirin as soon as possible postprocedure, at the discretion of the surgeon.     Joylene Grapes, NP 10/13/2022, 7:59 AM [facility]

## 2022-10-13 NOTE — Telephone Encounter (Signed)
   Pre-operative Risk Assessment    Patient Name: Andrew Payne  DOB: Nov 24, 1945 MRN: [id number]      Request for Surgical Clearance    Procedure:   HERNIA SURGERY  Date of Surgery:  Clearance TBD                                 Surgeon:  Harriette Bouillon, MD Surgeon's Group or Practice Name:  [facility] Phone number:  [phone] Fax number:  [phone]   Type of Clearance Requested:   - Medical  - Pharmacy:  Hold Aspirin NOT INDICATED HOW LONG   Type of Anesthesia:  General    Additional requests/questions:    Wilhemina Cash   10/13/2022, 7:14 AM

## 2022-11-02 NOTE — Progress Notes (Addendum)
Office Visit    Patient Name: Andrew Payne Date of Encounter: 11/02/2022  Primary Care Provider:  Cleatis Polka., MD Primary Cardiologist:  Orbie Pyo, MD Primary Electrophysiologist: Regan Lemming, MD   Past Medical History    Past Medical History:  Diagnosis Date   Bradycardia    cardiology-- dr Elberta Fortis--  work-up done included event monitor and echo (all in epic)   Carotid artery stenosis    per pt H&P dated 10-20-2018 --  <40%   Coronary atherosclerosis    Glaucoma, right eye    History of nuclear stress test 04/15/2005   (in epic)  for near syncope--- Low risk normal no ischemia, ef 57%   Hx of adenomatous colonic polyps 05/27/2016   Internal hemorrhoids    Morton neuroma, left    OA (osteoarthritis)    wrist, elbow, thumb, knees   PVC's (premature ventricular contractions)    RBBB (right bundle branch block)    States heart rate has been as low as 28 and asyptomatic   Past Surgical History:  Procedure Laterality Date   COLONOSCOPY  05/2016   TA   ELBOW SURGERY Left 04/2017   EXCISION MORTON'S NEUROMA Left 10/23/2018   Procedure: EXCISION MORTON'S NEUROMA LEFT FOOT;  Surgeon: Larey Dresser, DPM;  Location: [facility];  Service: Podiatry;  Laterality: Left;   HEMORRHOID BANDING  2018   INGUINAL HERNIA REPAIR Left 06/14/2017   Procedure: LEFT INGUINAL HERNIA REPAIR ERAS PATHWAY;  Surgeon: Harriette Bouillon, MD;  Location: [facility];  Service: General;  Laterality: Left;   INGUINAL HERNIA REPAIR Right 11-18-2009   dr Andrey Campanile  @[facility]    INSERTION OF MESH Left 06/14/2017   Procedure: INSERTION OF MESH;  Surgeon: Harriette Bouillon, MD;  Location:  SURGERY CENTER;  Service: General;  Laterality: Left;   KNEE ARTHROSCOPY W/ MENISCAL REPAIR Right 09/2015   POLYPECTOMY     TONSILLECTOMY AND ADENOIDECTOMY  child    Allergies  Allergies  Allergen Reactions   Codeine    Oxycodone Nausea And Vomiting     History of  Present Illness    Andrew Payne  is a 77 year old male with a PMH coronary atherosclerosis, RBBB, HLD, elevated BP, of sick sinus syndrome, mild carotid stenosis who presents today for preoperative clearance.  Andrew Payne was seen initially by Dr. Antoine Poche in 2019 for evaluation of chest pain.  He completed a cardiac CTA that showed calcium score of 50 with mild CAD in the proximal and mid LAD.  He was referred to Dr. Elberta Fortis for complaint of bradycardia.  He was found to have sick sinus syndrome with PVCs.  He wore an event monitor to evaluate PVC burden that revealed 4.3% PVCs.  He was started on flecainide for management and was seen in follow-up 09/14/2018 for surgical clearance which was granted.  Patient continued to have resting bradycardia with inability to increase heart rate on treadmill.  He was last seen by Dr. Elberta Fortis on 02/2022 and reported falling 14 feet off of a deck and suffering displaced rib fractures and pneumothorax with concussion.  He was noted to have PVCs in the hospital and flecainide was discontinued.  He underwent a 2D echo with bubble study to rule out AV shunt that showed no evidence of shunt.  He was seen most recently on 09/06/2022 by [name], PA.  During visit patient reported doing well and was exercising most days and feeling much better.  He reported walking up to 5 miles he gets his heart rate up into the 100s to 110's.  Andrew Payne presents today for preoperative clearance.  Since last being seen in the office patient reports that he has been feeling well but still experiencing episodes of shortness of breath that occur with increased physical activity.  He denies any chest pain or palpitations with shortness of breath.  His blood pressure today is controlled at 104/64 and heart rate was 51 bpm.  He is compliant with his current medications and denies any adverse reactions.  He does report occasional PVCs that are asymptomatic.  He is scheduled to undergo hernia repair  and presents today for clearance.  Patient denies chest pain, palpitations, dyspnea, PND, orthopnea, nausea, vomiting, dizziness, syncope, edema, weight gain, or early satiety.  Home Medications    Current Outpatient Medications  Medication Sig Dispense Refill   acetaminophen (TYLENOL) 500 MG tablet Take 1,000 mg by mouth 3 (three) times daily as needed for moderate pain.     aspirin EC 81 MG tablet Take 1 tablet (81 mg total) by mouth daily. Swallow whole.     atorvastatin (LIPITOR) 40 MG tablet TAKE 1 TABLET EVERY DAY 90 tablet 0   Cholecalciferol (VITAMIN D3) 1000 units CAPS Take 1 capsule by mouth daily.     dorzolamide (TRUSOPT) 2 % ophthalmic solution Place 1 drop into the right eye daily.      flecainide (TAMBOCOR) 50 MG tablet TAKE 1 TABLET(50 MG) BY MOUTH TWICE DAILY 180 tablet 3   latanoprost (XALATAN) 0.005 % ophthalmic solution Place 1 drop into the right eye at bedtime.      No current facility-administered medications for this visit.     Review of Systems  Please see the history of present illness.    (+) Fatigue (+) Shortness of breath  All other systems reviewed and are otherwise negative except as noted above.  Physical Exam    Wt Readings from Last 3 Encounters:  09/06/22 184 lb 3.2 oz (83.6 kg)  03/03/22 182 lb 9.6 oz (82.8 kg)  02/04/22 181 lb 2.2 oz (82.2 kg)   ZO:XWRUE were no vitals filed for this visit.,There is no height or weight on file to calculate BMI.  Constitutional:      Appearance: Healthy appearance. Not in distress.  Neck:     Vascular: JVD normal.  Pulmonary:     Effort: Pulmonary effort is normal.     Breath sounds: No wheezing. No rales. Diminished in the bases Cardiovascular:     Normal rate. Regular rhythm. Normal S1. Normal S2.      Murmurs: There is no murmur.  Edema:    Peripheral edema absent.  Abdominal:     Palpations: Abdomen is soft non tender. There is no hepatomegaly.  Skin:    General: Skin is warm and dry.   Neurological:     General: No focal deficit present.     Mental Status: Alert and oriented to person, place and time.     Cranial Nerves: Cranial nerves are intact.  EKG/LABS/ Recent Cardiac Studies    ECG personally reviewed by me today -sinus bradycardia with PVCs and RBBB with rate of 51 bpm and TWI noted in leads V4 and V5 with no further acute changes noted. Lab Results  Component Value Date   WBC 3.8 (L) 08/08/2017   HGB 13.7 08/08/2017   HCT 40.0 08/08/2017   MCV 98.5 08/08/2017   PLT 169 08/08/2017   Lab  Results  Component Value Date   CREATININE 1.14 01/04/2022   BUN 18 01/04/2022   NA 143 01/04/2022   K 4.6 01/04/2022   CL 106 01/04/2022   CO2 26 01/04/2022   Lab Results  Component Value Date   ALT 17 01/04/2022   AST 24 01/04/2022   ALKPHOS 98 01/04/2022   BILITOT 0.8 01/04/2022   Lab Results  Component Value Date   CHOL 119 01/04/2022   HDL 63 01/04/2022   LDLCALC 42 01/04/2022   TRIG 64 01/04/2022   CHOLHDL 1.9 01/04/2022    No results found for: "HGBA1C"   Assessment & Plan    1.  Preoperative clearance:  -Patient's RCRI score is 0.9%  Patient was able to complete greater than 4 METS of activity and underwent Lexiscan Myoview due to fatigue and shortness of breath that was low risk with no evidence of ischemia.  There was no significant change compared to his previous study in 2006 therefore he is fine to proceed with scheduled procedure at this time.  -Patient can hold ASA 81 mg 7 days prior to procedure.   2.  Nonobstructive CAD: -cardiac CTA that showed calcium score of 50 with mild CAD in the proximal and mid LAD -Patient reports episodes of shortness of breath with exertion and will undergo stress test as noted above. -Continue GDMT with ASA 81 mg, Lipitor 40 mg  3.  Hyperlipidemia: -Patient's LDL cholesterol was 42 -Continue Lipitor 40 mg  4.  Elevated blood pressure: -Patient's blood pressure today was controlled at 104/64  5.   PVCs: -Patient reports that symptoms are controlled with current medication regimen. -Continue flecainide 50 mg twice daily   Disposition: Follow-up with Orbie Pyo, MD or APP in 12 months Informed Consent   Shared Decision Making/Informed Consent The risks [chest pain, shortness of breath, cardiac arrhythmias, dizziness, blood pressure fluctuations, myocardial infarction, stroke/transient ischemic attack, nausea, vomiting, allergic reaction, radiation exposure, metallic taste sensation and life-threatening complications (estimated to be 1 in 10,000)], benefits (risk stratification, diagnosing coronary artery disease, treatment guidance) and alternatives of a nuclear stress test were discussed in detail with Mr. Filippini and he agrees to proceed.      Medication Adjustments/Labs and Tests Ordered: Current medicines are reviewed at length with the patient today.  Concerns regarding medicines are outlined above.   Signed, Napoleon Form, Leodis Rains, NP 11/02/2022, 1:04 PM [facility]

## 2022-11-03 ENCOUNTER — Ambulatory Visit: Payer: PPO | Attending: Nurse Practitioner | Admitting: Nurse Practitioner

## 2022-11-03 ENCOUNTER — Encounter: Payer: Self-pay | Admitting: Nurse Practitioner

## 2022-11-03 ENCOUNTER — Telehealth (HOSPITAL_COMMUNITY): Payer: Self-pay | Admitting: *Deleted

## 2022-11-03 VITALS — BP 104/64 | HR 51 | Ht 74.0 in | Wt 186.0 lb

## 2022-11-03 DIAGNOSIS — E785 Hyperlipidemia, unspecified: Secondary | ICD-10-CM | POA: Diagnosis not present

## 2022-11-03 DIAGNOSIS — Z01810 Encounter for preprocedural cardiovascular examination: Secondary | ICD-10-CM | POA: Diagnosis not present

## 2022-11-03 DIAGNOSIS — I495 Sick sinus syndrome: Secondary | ICD-10-CM | POA: Diagnosis not present

## 2022-11-03 DIAGNOSIS — R030 Elevated blood-pressure reading, without diagnosis of hypertension: Secondary | ICD-10-CM

## 2022-11-03 DIAGNOSIS — I2510 Atherosclerotic heart disease of native coronary artery without angina pectoris: Secondary | ICD-10-CM

## 2022-11-03 NOTE — Telephone Encounter (Signed)
Patient given detailed instructions per Myocardial Perfusion Study Information Sheet for the test on 11/08/2022 at 10:00. Patient notified to arrive 15 minutes early and that it is imperative to arrive on time for appointment to keep from having the test rescheduled.  If you need to cancel or reschedule your appointment, please call the office within 24 hours of your appointment. . Patient verbalized understanding.Andrew Payne

## 2022-11-03 NOTE — Patient Instructions (Addendum)
Medication Instructions:  Your physician recommends that you continue on your current medications as directed. Please refer to the Current Medication list given to you today. *If you need a refill on your cardiac medications before your next appointment, please call your pharmacy*   Lab Work: None ordered   Testing/Procedures: Your physician has requested that you have a lexiscan myoview. For further information please visit [email or web address] Please follow instruction sheet, as given.   Follow-Up: At [facility], you and your health needs are our priority.  As part of our continuing mission to provide you with exceptional heart care, we have created designated Provider Care Teams.  These Care Teams include your primary Cardiologist (physician) and Advanced Practice Providers (APPs -  Physician Assistants and Nurse Practitioners) who all work together to provide you with the care you need, when you need it.  We recommend signing up for the patient portal called "MyChart".  Sign up information is provided on this After Visit Summary.  MyChart is used to connect with patients for Virtual Visits (Telemedicine).  Patients are able to view lab/test results, encounter notes, upcoming appointments, etc.  Non-urgent messages can be sent to your provider as well.   To learn more about what you can do with MyChart, go to [email or web address].    Your next appointment:   12 month(s)  Provider:   Orbie Pyo, MD     Other Instructions

## 2022-11-08 ENCOUNTER — Ambulatory Visit (HOSPITAL_COMMUNITY): Payer: PPO | Attending: Nurse Practitioner

## 2022-11-08 DIAGNOSIS — I495 Sick sinus syndrome: Secondary | ICD-10-CM | POA: Diagnosis not present

## 2022-11-08 DIAGNOSIS — E785 Hyperlipidemia, unspecified: Secondary | ICD-10-CM | POA: Insufficient documentation

## 2022-11-08 DIAGNOSIS — Z01810 Encounter for preprocedural cardiovascular examination: Secondary | ICD-10-CM | POA: Insufficient documentation

## 2022-11-08 DIAGNOSIS — R030 Elevated blood-pressure reading, without diagnosis of hypertension: Secondary | ICD-10-CM | POA: Insufficient documentation

## 2022-11-08 DIAGNOSIS — I2510 Atherosclerotic heart disease of native coronary artery without angina pectoris: Secondary | ICD-10-CM | POA: Insufficient documentation

## 2022-11-08 LAB — MYOCARDIAL PERFUSION IMAGING
LV dias vol: 104 mL (ref 62–150)
LV sys vol: 52 mL
Nuc Stress EF: 50 %
Peak HR: 60 {beats}/min
Rest HR: 40 {beats}/min
Rest Nuclear Isotope Dose: 10.3 mCi
SDS: 1
SRS: 0
SSS: 1
ST Depression (mm): 0 mm
Stress Nuclear Isotope Dose: 32.1 mCi
TID: 0.96

## 2022-11-08 MED ORDER — REGADENOSON 0.4 MG/5ML IV SOLN
0.4000 mg | Freq: Once | INTRAVENOUS | Status: AC
Start: 2022-11-08 — End: 2022-11-08
  Administered 2022-11-08: 0.4 mg via INTRAVENOUS

## 2022-11-08 MED ORDER — TECHNETIUM TC 99M TETROFOSMIN IV KIT
10.3000 | PACK | Freq: Once | INTRAVENOUS | Status: AC | PRN
  Administered 2022-11-08: 10.3 via INTRAVENOUS

## 2022-11-08 MED ORDER — TECHNETIUM TC 99M TETROFOSMIN IV KIT
32.1000 | PACK | Freq: Once | INTRAVENOUS | Status: AC | PRN
  Administered 2022-11-08: 32.1 via INTRAVENOUS

## 2022-11-09 NOTE — Telephone Encounter (Signed)
I spoke with the pt and explained the result to the pt and let him know that we have sent his clearance... I will forward to Alden Server to review his other part of his message to be sure no further considerations... re: his Dyspnea on exertion.

## 2022-11-24 DIAGNOSIS — E785 Hyperlipidemia, unspecified: Secondary | ICD-10-CM | POA: Diagnosis not present

## 2022-11-24 DIAGNOSIS — I2510 Atherosclerotic heart disease of native coronary artery without angina pectoris: Secondary | ICD-10-CM | POA: Diagnosis not present

## 2022-11-24 DIAGNOSIS — Z125 Encounter for screening for malignant neoplasm of prostate: Secondary | ICD-10-CM | POA: Diagnosis not present

## 2022-11-25 ENCOUNTER — Ambulatory Visit: Payer: PPO | Admitting: Orthopaedic Surgery

## 2022-12-01 DIAGNOSIS — R972 Elevated prostate specific antigen [PSA]: Secondary | ICD-10-CM | POA: Diagnosis not present

## 2022-12-01 DIAGNOSIS — Z23 Encounter for immunization: Secondary | ICD-10-CM | POA: Diagnosis not present

## 2022-12-01 DIAGNOSIS — K4090 Unilateral inguinal hernia, without obstruction or gangrene, not specified as recurrent: Secondary | ICD-10-CM | POA: Diagnosis not present

## 2022-12-01 DIAGNOSIS — I493 Ventricular premature depolarization: Secondary | ICD-10-CM | POA: Diagnosis not present

## 2022-12-01 DIAGNOSIS — I6529 Occlusion and stenosis of unspecified carotid artery: Secondary | ICD-10-CM | POA: Diagnosis not present

## 2022-12-01 DIAGNOSIS — S2243XS Multiple fractures of ribs, bilateral, sequela: Secondary | ICD-10-CM | POA: Diagnosis not present

## 2022-12-01 DIAGNOSIS — I495 Sick sinus syndrome: Secondary | ICD-10-CM | POA: Diagnosis not present

## 2022-12-01 DIAGNOSIS — Z1331 Encounter for screening for depression: Secondary | ICD-10-CM | POA: Diagnosis not present

## 2022-12-01 DIAGNOSIS — R001 Bradycardia, unspecified: Secondary | ICD-10-CM | POA: Diagnosis not present

## 2022-12-01 DIAGNOSIS — R82998 Other abnormal findings in urine: Secondary | ICD-10-CM | POA: Diagnosis not present

## 2022-12-01 DIAGNOSIS — Z1339 Encounter for screening examination for other mental health and behavioral disorders: Secondary | ICD-10-CM | POA: Diagnosis not present

## 2022-12-01 DIAGNOSIS — I2510 Atherosclerotic heart disease of native coronary artery without angina pectoris: Secondary | ICD-10-CM | POA: Diagnosis not present

## 2022-12-01 DIAGNOSIS — I209 Angina pectoris, unspecified: Secondary | ICD-10-CM | POA: Diagnosis not present

## 2022-12-01 DIAGNOSIS — N1831 Chronic kidney disease, stage 3a: Secondary | ICD-10-CM | POA: Diagnosis not present

## 2022-12-01 DIAGNOSIS — E785 Hyperlipidemia, unspecified: Secondary | ICD-10-CM | POA: Diagnosis not present

## 2022-12-01 DIAGNOSIS — Z0000 Encounter for general adult medical examination without abnormal findings: Secondary | ICD-10-CM | POA: Diagnosis not present

## 2022-12-09 ENCOUNTER — Other Ambulatory Visit: Payer: Self-pay

## 2022-12-09 ENCOUNTER — Encounter (HOSPITAL_BASED_OUTPATIENT_CLINIC_OR_DEPARTMENT_OTHER): Payer: Self-pay | Admitting: Surgery

## 2022-12-09 NOTE — Progress Notes (Signed)
   12/09/22 1312  PAT Phone Screen  Is the patient taking a GLP-1 receptor agonist? No  Do You Have Diabetes? No  Do You Have Hypertension? No  Have You Ever Been to the ER for Asthma? No  Have You Taken Oral Steroids in the Past 3 Months? No  Do you Take Phenteramine or any Other Diet Drugs? No  Recent  Lab Work, EKG, CXR? (S)  Yes  Where was this test performed? [facility]  Do you have a history of heart problems? Kathie Rhodes)  Yes  Cardiologist Name Neila Gear., NP  Have you ever had tests on your heart? Yes  What cardiac tests were performed? (S)  Echo;EKG;Stress Test;Chest X-ray  What date/year were cardiac tests completed? (S)  CT 11/24/21, Echo 11/12/21, EKG 11/08/22, Stress 11/08/22  Results viewable: [facility] Media Tab  Any Recent Hospitalizations? No  Height 6\' 2"  (1.88 m)  Weight 83.9 kg  Pat Appointment Scheduled No  Reason for No Appointment Not Needed   Pt cleared by cardiology. Cardiac history has been evaluated and pt has been cleared for [facility] by Dr. Mal Amabile. Pt maintain's bradycardia between 40-50's w/no associated symptoms. Is compliant with meds and maintains healthy lifestyle with exercise.

## 2022-12-15 NOTE — H&P (Signed)
History of Present Illness: Andrew Payne is a 77 y.o. male who is seen today as an office consultation for evaluation of New Consultation (increasing hernia size,)  Patient presents for evaluation of right groin bulge. This is a developing of the last year. He fell last fall and broke some ribs and is still recovering from all that. He thinks the hernia may be caused from that. Of note he had a left inguinal hernia repaired 5 years ago. He does cause discomfort especially when he plays golf. The bulge is soft and pops and pops out on the right side.  Review of Systems: A complete review of systems was obtained from the patient. I have reviewed this information and discussed as appropriate with the patient. See HPI as well for other ROS.    Medical History: Past Medical History:  Diagnosis Date  Arthritis  Glaucoma (increased eye pressure)   There is no problem list on file for this patient.  Past Surgical History:  Procedure Laterality Date  ARTHROSCOPIC ROTATOR CUFF REPAIR Right  HERNIA REPAIR    Allergies  Allergen Reactions  Oxycodone Other (See Comments) and Nausea And Vomiting   Current Outpatient Medications on File Prior to Visit  Medication Sig Dispense Refill  atorvastatin (LIPITOR) 40 MG tablet Take 40 mg by mouth once daily  flecainide (TAMBOCOR) 50 MG tablet Take 50 mg by mouth 2 (two) times daily  dorzolamide (TRUSOPT) 2 % ophthalmic solution INSTILL 1 DROP INTO RIGHT EYE EVERY AM  latanoprost (XALATAN) 0.005 % ophthalmic solution INSTILL 1 DROP IN RIGHT EYE EVERY NIGHT AT BEDTIME   No current facility-administered medications on file prior to visit.   Family History  Problem Relation Age of Onset  Breast cancer Mother  Hyperlipidemia (Elevated cholesterol) Father    Social History   Tobacco Use  Smoking Status Never  Smokeless Tobacco Never    Social History   Socioeconomic History  Marital status: Married  Tobacco Use  Smoking status: Never   Smokeless tobacco: Never  Substance and Sexual Activity  Alcohol use: Not Currently  Drug use: Never   Social Determinants of Health   Food Insecurity: No Food Insecurity (01/08/2022)  Received from [facility] - Epic  Hunger Vital Sign  Worried About Running Out of Food in the Last Year: Never true  Ran Out of Food in the Last Year: Never true   Objective:   Vitals:  10/12/22 1601  BP: 126/72  Pulse: 57  Temp: 36.7 C (98 F)  SpO2: 98%  Weight: 84.4 kg (186 lb)  Height: 188 cm (6\' 2" )   Body mass index is 23.88 kg/m.  Physical Exam HENT:  Head: Normocephalic.  Eyes:  Pupils: Pupils are equal, round, and reactive to light.  Cardiovascular:  Rate and Rhythm: Normal rate.  Pulmonary:  Effort: Pulmonary effort is normal.  Breath sounds: No stridor.  Abdominal:  Palpations: Abdomen is soft.  Hernia: A hernia is present. Hernia is present in the right inguinal area. There is no hernia in the left inguinal area, right femoral area or left femoral area.   Comments: Reducible right inguinal hernia Left groin scar noted  Musculoskeletal:  Cervical back: Normal range of motion.  Skin: General: Skin is warm.  Neurological:  General: No focal deficit present.  Mental Status: He is alert.  Psychiatric:  Mood and Affect: Mood normal.  Behavior: Behavior normal.     Assessment and Plan:   Diagnoses and all orders for this visit:  Right  inguinal hernia   Discussed repair of his right inguinal hernia with mesh. Risks and benefits were reviewed as well as techniques, the use of mesh and complications.  Risk of bleeding, infection, recurrence, nerve injury, blood vessel injury, injury to neighboring structures chronic pain and need for recurrent or repeat surgery.  Plan repair of right inguinal hernia with mesh   Hayden Rasmussen, MD   I spent a total of 44 minutes in both face-to-face and non-face-to-face activities, excluding procedures performed, for  this visit on the date of this encounter.

## 2022-12-16 ENCOUNTER — Encounter (HOSPITAL_BASED_OUTPATIENT_CLINIC_OR_DEPARTMENT_OTHER): Payer: Self-pay | Admitting: Surgery

## 2022-12-16 ENCOUNTER — Other Ambulatory Visit: Payer: Self-pay

## 2022-12-16 ENCOUNTER — Ambulatory Visit (HOSPITAL_BASED_OUTPATIENT_CLINIC_OR_DEPARTMENT_OTHER): Payer: PPO | Admitting: Anesthesiology

## 2022-12-16 ENCOUNTER — Ambulatory Visit (HOSPITAL_BASED_OUTPATIENT_CLINIC_OR_DEPARTMENT_OTHER)
Admission: RE | Admit: 2022-12-16 | Discharge: 2022-12-16 | Disposition: A | Discharge status: Home / Self Care | Payer: PPO | Attending: Surgery | Admitting: Surgery

## 2022-12-16 ENCOUNTER — Encounter (HOSPITAL_BASED_OUTPATIENT_CLINIC_OR_DEPARTMENT_OTHER)
Admission: RE | Disposition: A | Discharge status: Home / Self Care | Payer: Self-pay | Source: Home / Self Care | Attending: Surgery

## 2022-12-16 DIAGNOSIS — K4090 Unilateral inguinal hernia, without obstruction or gangrene, not specified as recurrent: Secondary | ICD-10-CM

## 2022-12-16 HISTORY — PX: INGUINAL HERNIA REPAIR: SHX194

## 2022-12-16 HISTORY — DX: Nausea with vomiting, unspecified: Z98.890

## 2022-12-16 SURGERY — REPAIR, HERNIA, INGUINAL, ADULT
Anesthesia: General | Site: Groin | Laterality: Right

## 2022-12-16 MED ORDER — BUPIVACAINE-EPINEPHRINE 0.25% -1:200000 IJ SOLN
INTRAMUSCULAR | Status: DC | PRN
  Administered 2022-12-16: 10 mL

## 2022-12-16 MED ORDER — FENTANYL CITRATE (PF) 100 MCG/2ML IJ SOLN
INTRAMUSCULAR | Status: AC
  Filled 2022-12-16: qty 2

## 2022-12-16 MED ORDER — MIDAZOLAM HCL 2 MG/2ML IJ SOLN
INTRAMUSCULAR | Status: AC
  Filled 2022-12-16: qty 2

## 2022-12-16 MED ORDER — PROPOFOL 10 MG/ML IV BOLUS
INTRAVENOUS | Status: AC
  Filled 2022-12-16: qty 20

## 2022-12-16 MED ORDER — ACETAMINOPHEN 500 MG PO TABS
1000.0000 mg | ORAL_TABLET | Freq: Once | ORAL | Status: AC
  Administered 2022-12-16: 1000 mg via ORAL

## 2022-12-16 MED ORDER — 0.9 % SODIUM CHLORIDE (POUR BTL) OPTIME
TOPICAL | Status: DC | PRN
  Administered 2022-12-16: 1000 mL

## 2022-12-16 MED ORDER — ONDANSETRON HCL 4 MG/2ML IJ SOLN
INTRAMUSCULAR | Status: AC
  Filled 2022-12-16: qty 2

## 2022-12-16 MED ORDER — FENTANYL CITRATE (PF) 100 MCG/2ML IJ SOLN: 25.0000 ug | INTRAMUSCULAR | Status: DC | PRN

## 2022-12-16 MED ORDER — LIDOCAINE HCL (CARDIAC) PF 100 MG/5ML IV SOSY
PREFILLED_SYRINGE | INTRAVENOUS | Status: DC | PRN
  Administered 2022-12-16: 80 mg via INTRAVENOUS

## 2022-12-16 MED ORDER — BUPIVACAINE HCL (PF) 0.25 % IJ SOLN
INTRAMUSCULAR | Status: DC | PRN
  Administered 2022-12-16: 30 mL via PERINEURAL

## 2022-12-16 MED ORDER — ROCURONIUM BROMIDE 100 MG/10ML IV SOLN
INTRAVENOUS | Status: DC | PRN
  Administered 2022-12-16: 60 mg via INTRAVENOUS

## 2022-12-16 MED ORDER — ACETAMINOPHEN 500 MG PO TABS
ORAL_TABLET | ORAL | Status: AC
  Filled 2022-12-16: qty 2

## 2022-12-16 MED ORDER — LACTATED RINGERS IV SOLN: INTRAVENOUS | Status: DC

## 2022-12-16 MED ORDER — CEFAZOLIN SODIUM-DEXTROSE 2-4 GM/100ML-% IV SOLN
2.0000 g | INTRAVENOUS | Status: AC
  Administered 2022-12-16: 2 g via INTRAVENOUS

## 2022-12-16 MED ORDER — CHLORHEXIDINE GLUCONATE CLOTH 2 % EX PADS: 6.0000 | MEDICATED_PAD | Freq: Once | CUTANEOUS | Status: DC

## 2022-12-16 MED ORDER — MIDAZOLAM HCL 5 MG/5ML IJ SOLN
INTRAMUSCULAR | Status: DC | PRN
  Administered 2022-12-16: 2 mg via INTRAVENOUS

## 2022-12-16 MED ORDER — PROPOFOL 10 MG/ML IV BOLUS
INTRAVENOUS | Status: DC | PRN
  Administered 2022-12-16: 150 mg via INTRAVENOUS

## 2022-12-16 MED ORDER — TRAMADOL HCL 50 MG PO TABS
50.0000 mg | ORAL_TABLET | Freq: Four times a day (QID) | ORAL | 0 refills | Status: DC | PRN
Start: 2022-12-16 — End: 2023-09-14

## 2022-12-16 MED ORDER — LIDOCAINE 2% (20 MG/ML) 5 ML SYRINGE
INTRAMUSCULAR | Status: AC
  Filled 2022-12-16: qty 5

## 2022-12-16 MED ORDER — CEFAZOLIN SODIUM-DEXTROSE 2-4 GM/100ML-% IV SOLN
INTRAVENOUS | Status: AC
  Filled 2022-12-16: qty 100

## 2022-12-16 MED ORDER — ONDANSETRON HCL 4 MG PO TABS: 4.0000 mg | ORAL_TABLET | Freq: Three times a day (TID) | ORAL | 0 refills | Status: DC | PRN

## 2022-12-16 MED ORDER — IBUPROFEN 800 MG PO TABS: 800.0000 mg | ORAL_TABLET | Freq: Three times a day (TID) | ORAL | 0 refills | Status: DC | PRN

## 2022-12-16 MED ORDER — EPHEDRINE SULFATE (PRESSORS) 50 MG/ML IJ SOLN
INTRAMUSCULAR | Status: DC | PRN
  Administered 2022-12-16: 10 mg via INTRAVENOUS

## 2022-12-16 MED ORDER — DEXAMETHASONE SODIUM PHOSPHATE 4 MG/ML IJ SOLN
INTRAMUSCULAR | Status: DC | PRN
  Administered 2022-12-16: 5 mg via INTRAVENOUS

## 2022-12-16 MED ORDER — AMISULPRIDE (ANTIEMETIC) 5 MG/2ML IV SOLN: 10.0000 mg | Freq: Once | INTRAVENOUS | Status: DC | PRN

## 2022-12-16 MED ORDER — SUGAMMADEX SODIUM 500 MG/5ML IV SOLN
INTRAVENOUS | Status: DC | PRN
  Administered 2022-12-16: 350 mg via INTRAVENOUS

## 2022-12-16 MED ORDER — FENTANYL CITRATE (PF) 100 MCG/2ML IJ SOLN
INTRAMUSCULAR | Status: DC | PRN
  Administered 2022-12-16 (×2): 25 ug via INTRAVENOUS
  Administered 2022-12-16: 50 ug via INTRAVENOUS

## 2022-12-16 MED ORDER — ONDANSETRON HCL 4 MG/2ML IJ SOLN
INTRAMUSCULAR | Status: DC | PRN
Start: 2022-12-16 — End: 2022-12-16
  Administered 2022-12-16: 4 mg via INTRAVENOUS

## 2022-12-16 MED ORDER — DEXAMETHASONE SODIUM PHOSPHATE 10 MG/ML IJ SOLN
INTRAMUSCULAR | Status: AC
  Filled 2022-12-16: qty 1

## 2022-12-16 SURGICAL SUPPLY — 48 items
ADH SKN CLS APL DERMABOND .7 (GAUZE/BANDAGES/DRESSINGS) ×1
APL PRP STRL LF DISP 70% ISPRP (MISCELLANEOUS) ×1
BLADE CLIPPER SURG (BLADE) IMPLANT
BLADE SURG 15 STRL LF DISP TIS (BLADE) ×1 IMPLANT
BLADE SURG 15 STRL SS (BLADE) ×1
CANISTER SUCT 1200ML W/VALVE (MISCELLANEOUS) IMPLANT
CHLORAPREP W/TINT 26 (MISCELLANEOUS) ×1 IMPLANT
COVER BACK TABLE 60X90IN (DRAPES) ×1 IMPLANT
COVER MAYO STAND STRL (DRAPES) ×1 IMPLANT
DERMABOND ADVANCED .7 DNX12 (GAUZE/BANDAGES/DRESSINGS) ×1 IMPLANT
DRAIN PENROSE .5X12 LATEX STL (DRAIN) ×1 IMPLANT
DRAPE LAPAROTOMY TRNSV 102X78 (DRAPES) ×1 IMPLANT
DRAPE UTILITY XL STRL (DRAPES) ×1 IMPLANT
ELECT COATED BLADE 2.86 ST (ELECTRODE) ×1 IMPLANT
ELECT REM PT RETURN 9FT ADLT (ELECTROSURGICAL) ×1
ELECTRODE REM PT RTRN 9FT ADLT (ELECTROSURGICAL) ×1 IMPLANT
GAUZE 4X4 16PLY ~~LOC~~+RFID DBL (SPONGE) IMPLANT
GAUZE SPONGE 4X4 12PLY STRL LF (GAUZE/BANDAGES/DRESSINGS) IMPLANT
GLOVE BIOGEL PI IND STRL 8 (GLOVE) ×1 IMPLANT
GLOVE ECLIPSE 8.0 STRL XLNG CF (GLOVE) ×1 IMPLANT
GOWN STRL REUS W/ TWL LRG LVL3 (GOWN DISPOSABLE) ×2 IMPLANT
GOWN STRL REUS W/ TWL XL LVL3 (GOWN DISPOSABLE) ×1 IMPLANT
GOWN STRL REUS W/TWL LRG LVL3 (GOWN DISPOSABLE) ×2
GOWN STRL REUS W/TWL XL LVL3 (GOWN DISPOSABLE) ×1
MESH HERNIA SYS ULTRAPRO LRG (Mesh General) IMPLANT
NDL HYPO 25X1 1.5 SAFETY (NEEDLE) ×1 IMPLANT
NEEDLE HYPO 25X1 1.5 SAFETY (NEEDLE) ×1
NS IRRIG 1000ML POUR BTL (IV SOLUTION) IMPLANT
PACK BASIN DAY SURGERY FS (CUSTOM PROCEDURE TRAY) ×1 IMPLANT
PENCIL SMOKE EVACUATOR (MISCELLANEOUS) ×1 IMPLANT
SLEEVE SCD COMPRESS KNEE MED (STOCKING) ×1 IMPLANT
SPIKE FLUID TRANSFER (MISCELLANEOUS) IMPLANT
SPONGE T-LAP 4X18 ~~LOC~~+RFID (SPONGE) ×1 IMPLANT
STRIP CLOSURE SKIN 1/2X4 (GAUZE/BANDAGES/DRESSINGS) IMPLANT
SUT MON AB 4-0 PC3 18 (SUTURE) ×1 IMPLANT
SUT NOVA 0 T19/GS 22DT (SUTURE) IMPLANT
SUT NOVA NAB DX-16 0-1 5-0 T12 (SUTURE) ×2 IMPLANT
SUT VIC AB 2-0 SH 27 (SUTURE) ×1
SUT VIC AB 2-0 SH 27XBRD (SUTURE) ×1 IMPLANT
SUT VIC AB 3-0 54X BRD REEL (SUTURE) IMPLANT
SUT VIC AB 3-0 BRD 54 (SUTURE)
SUT VICRYL 3-0 CR8 SH (SUTURE) ×1 IMPLANT
SUT VICRYL AB 2 0 TIE (SUTURE) IMPLANT
SUT VICRYL AB 2 0 TIES (SUTURE)
SYR CONTROL 10ML LL (SYRINGE) ×1 IMPLANT
TOWEL GREEN STERILE FF (TOWEL DISPOSABLE) ×1 IMPLANT
TUBE CONNECTING 20X1/4 (TUBING) IMPLANT
YANKAUER SUCT BULB TIP NO VENT (SUCTIONS) IMPLANT

## 2022-12-16 NOTE — Transfer of Care (Signed)
Immediate Anesthesia Transfer of Care Note  Patient: Andrew Payne  Procedure(s) Performed: RIGHT HERNIA REPAIR INGUINAL WITH MESH (Right: Groin)  Patient Location: PACU  Anesthesia Type:General and Regional  Level of Consciousness: awake, alert , and oriented  Airway & Oxygen Therapy: Patient Spontanous Breathing and Patient connected to face mask oxygen  Post-op Assessment: Report given to RN and Post -op Vital signs reviewed and stable  Post vital signs: Reviewed and stable  Last Vitals:  Vitals Value Taken Time  BP 148/83 12/16/22 1219  Temp    Pulse 56 12/16/22 1222  Resp 16 12/16/22 1222  SpO2 98 % 12/16/22 1222  Vitals shown include unfiled device data.  Last Pain:  Vitals:   12/16/22 1015  TempSrc: Oral  PainSc: 0-No pain      Patients Stated Pain Goal: 7 (12/16/22 1015)  Complications: No notable events documented.

## 2022-12-16 NOTE — Anesthesia Procedure Notes (Signed)
Procedure Name: Intubation Date/Time: 12/16/2022 11:10 AM  Performed by: Karen Kitchens, CRNAPre-anesthesia Checklist: Patient identified, Emergency Drugs available, Suction available and Patient being monitored Patient Re-evaluated:Patient Re-evaluated prior to induction Oxygen Delivery Method: Circle system utilized Preoxygenation: Pre-oxygenation with 100% oxygen Induction Type: IV induction Ventilation: Mask ventilation without difficulty Laryngoscope Size: Mac and 4 Grade View: Grade I Tube type: Oral Tube size: 7.5 mm Number of attempts: 1 Airway Equipment and Method: Stylet and Oral airway Placement Confirmation: ETT inserted through vocal cords under direct vision, positive ETCO2, breath sounds checked- equal and bilateral and CO2 detector Secured at: 23 cm Tube secured with: Tape Dental Injury: Teeth and Oropharynx as per pre-operative assessment

## 2022-12-16 NOTE — Discharge Instructions (Addendum)
[facility] _____[facility]  UMBILICAL OR INGUINAL HERNIA REPAIR: POST OP INSTRUCTIONS  Always review your discharge instruction sheet given to you by the facility where your surgery was performed. IF YOU HAVE DISABILITY OR FAMILY LEAVE FORMS, YOU MUST BRING THEM TO THE OFFICE FOR PROCESSING.   DO NOT GIVE THEM TO YOUR DOCTOR.  1. A  prescription for pain medication may be given to you upon discharge.  Take your pain medication as prescribed, if needed.  If narcotic pain medicine is not needed, then you may take acetaminophen (Tylenol) or ibuprofen (Advil) as needed. 2. Take your usually prescribed medications unless otherwise directed. If you need a refill on your pain medication, please contact your pharmacy.  They will contact our office to request authorization. Prescriptions will not be filled after 5 pm or on week-ends. 3. You should follow a light diet the first 24 hours after arrival home, such as soup and crackers, etc.  Be sure to include lots of fluids daily.  Resume your normal diet the day after surgery. 4.Most patients will experience some swelling and bruising around the umbilicus or in the groin and scrotum.  Ice packs and reclining will help.  Swelling and bruising can take several days to resolve.  6. It is common to experience some constipation if taking pain medication after surgery.  Increasing fluid intake and taking a stool softener (such as Colace) will usually help or prevent this problem from occurring.  A mild laxative (Milk of Magnesia or Miralax) should be taken according to package directions if there are no bowel movements after 48 hours. 7. Unless discharge instructions indicate otherwise, you may remove your bandages 24-48 hours after surgery, and you may shower at that time.  You may have steri-strips (small skin tapes) in place directly over the incision.  These strips should be left on the skin for 7-10 days.  If your surgeon used skin glue on the  incision, you may shower in 24 hours.  The glue will flake off over the next 2-3 weeks.  Any sutures or staples will be removed at the office during your follow-up visit. 8. ACTIVITIES:  You may resume regular (light) daily activities beginning the next day--such as daily self-care, walking, climbing stairs--gradually increasing activities as tolerated.  You may have sexual intercourse when it is comfortable.  Refrain from any heavy lifting or straining until approved by your doctor.  a.You may drive when you are no longer taking prescription pain medication, you can comfortably wear a seatbelt, and you can safely maneuver your car and apply brakes. b.RETURN TO WORK:   _____________________________________________  9.You should see your doctor in the office for a follow-up appointment approximately 2-3 weeks after your surgery.  Make sure that you call for this appointment within a day or two after you arrive home to insure a convenient appointment time. 10.OTHER INSTRUCTIONS: _________________________    _____________________________________  WHEN TO CALL YOUR DOCTOR: Fever over 101.0 Inability to urinate Nausea and/or vomiting Extreme swelling or bruising Continued bleeding from incision. Increased pain, redness, or drainage from the incision  The clinic staff is available to answer your questions during regular business hours.  Please don't hesitate to call and ask to speak to one of the nurses for clinical concerns.  If you have a medical emergency, go to the nearest emergency room or call 911.  A surgeon from [facility] is always on call at the hospital   [address],  [place], [place]  [address] ?  [address] [address], [place], [place]   [address] [phone] ? [phone] ? FAX [phone] Web site: [email or web address]   May have Tylenol after 6:20pm if needed.    Post Anesthesia Home Care Instructions  Activity: Get plenty of rest for  the remainder of the day. A responsible individual must stay with you for 24 hours following the procedure.  For the next 24 hours, DO NOT: -Drive a car -Advertising copywriter -Drink alcoholic beverages -Take any medication unless instructed by your physician -Make any legal decisions or sign important papers.  Meals: Start with liquid foods such as gelatin or soup. Progress to regular foods as tolerated. Avoid greasy, spicy, heavy foods. If nausea and/or vomiting occur, drink only clear liquids until the nausea and/or vomiting subsides. Call your physician if vomiting continues.  Special Instructions/Symptoms: Your throat may feel dry or sore from the anesthesia or the breathing tube placed in your throat during surgery. If this causes discomfort, gargle with warm salt water. The discomfort should disappear within 24 hours.     Regional Anesthesia Blocks  1. You may not be able to move or feel the "blocked" extremity after a regional anesthetic block. This may last may last from 3-48 hours after placement, but it will go away. The length of time depends on the medication injected and your individual response to the medication. As the nerves start to wake up, you may experience tingling as the movement and feeling returns to your extremity. If the numbness and inability to move your extremity has not gone away after 48 hours, please call your surgeon.   2. The extremity that is blocked will need to be protected until the numbness is gone and the strength has returned. Because you cannot feel it, you will need to take extra care to avoid injury. Because it may be weak, you may have difficulty moving it or using it. You may not know what position it is in without looking at it while the block is in effect.  3. For blocks in the legs and feet, returning to weight bearing and walking needs to be done carefully. You will need to wait until the numbness is entirely gone and the strength has returned. You  should be able to move your leg and foot normally before you try and bear weight or walk. You will need someone to be with you when you first try to ensure you do not fall and possibly risk injury.  4. Bruising and tenderness at the needle site are common side effects and will resolve in a few days.  5. Persistent numbness or new problems with movement should be communicated to the surgeon or the [facility] [phone] [facility] [phone]).

## 2022-12-16 NOTE — Interval H&P Note (Signed)
History and Physical Interval Note:  12/16/2022 9:55 AM  Laurence Slate  has presented today for surgery, with the diagnosis of RIGHT INGUINAL HERNIA.  The various methods of treatment have been discussed with the patient and family. After consideration of risks, benefits and other options for treatment, the patient has consented to  Procedure(s) with comments: RIGHT HERNIA REPAIR INGUINAL WITH MESH (Right) - TAP BLOCK/GEN as a surgical intervention.  The patient's history has been reviewed, patient examined, no change in status, stable for surgery.  I have reviewed the patient's chart and labs.  Questions were answered to the patient's satisfaction.   The risk of hernia repair include bleeding,  Infection,   Recurrence of the hernia,  Mesh use, chronic pain,  Organ injury,  Bowel injury,  Bladder injury,   nerve injury with numbness around the incision,  Death,  and worsening of preexisting  medical problems.  The alternatives to surgery have been discussed as well..  Long term expectations of both operative and non operative treatments have been discussed.   The patient agrees to proceed.   Cullin Dishman A Keerat Denicola

## 2022-12-16 NOTE — Anesthesia Procedure Notes (Signed)
Anesthesia Regional Block: TAP block   Pre-Anesthetic Checklist: , timeout performed,  Correct Patient, Correct Site, Correct Laterality,  Correct Procedure, Correct Position, site marked,  Risks and benefits discussed,  Surgical consent,  Pre-op evaluation,  At surgeon's request and post-op pain management  Laterality: Right  Prep: chloraprep       Needles:  Injection technique: Single-shot  Needle Type: Echogenic Stimulator Needle     Needle Length: 9cm  Needle Gauge: 21     Additional Needles:   Procedures:,,,, ultrasound used (permanent image in chart),,    Narrative:  Start time: 12/16/2022 10:42 AM End time: 12/16/2022 10:46 AM Injection made incrementally with aspirations every 5 mL.  Performed by: Personally  Anesthesiologist: Linton Rump, MD  Additional Notes: Discussed risks and benefits of nerve block including, but not limited to, prolonged and/or permanent nerve injury involving sensory and/or motor function. Monitors were applied and a time-out was performed. The nerve and associated structures were visualized under ultrasound guidance. After negative aspiration, local anesthetic was slowly injected around the nerve. There was no evidence of high pressure during the procedure. There were no paresthesias. VSS remained stable and the patient tolerated the procedure well.

## 2022-12-16 NOTE — Anesthesia Postprocedure Evaluation (Signed)
Anesthesia Post Note  Patient: Andrew Payne  Procedure(s) Performed: RIGHT HERNIA REPAIR INGUINAL WITH MESH (Right: Groin)     Patient location during evaluation: PACU Anesthesia Type: General Level of consciousness: awake Pain management: pain level controlled Vital Signs Assessment: post-procedure vital signs reviewed and stable Respiratory status: spontaneous breathing, nonlabored ventilation and respiratory function stable Cardiovascular status: blood pressure returned to baseline and stable Postop Assessment: no apparent nausea or vomiting Anesthetic complications: no   No notable events documented.  Last Vitals:  Vitals:   12/16/22 1230 12/16/22 1245  BP: (!) 142/80 (!) 151/81  Pulse: (!) 46 (!) 47  Resp: 16 17  Temp:    SpO2: 99% 96%    Last Pain:  Vitals:   12/16/22 1245  TempSrc:   PainSc: 0-No pain                 Linton Rump

## 2022-12-16 NOTE — Anesthesia Preprocedure Evaluation (Addendum)
Anesthesia Evaluation  Patient identified by MRN, date of birth, ID band Patient awake    Reviewed: Allergy & Precautions, NPO status , Patient's Chart, lab work & pertinent test results  History of Anesthesia Complications (+) PONV and history of anesthetic complications  Airway Mallampati: II  TM Distance: >3 FB Neck ROM: Full    Dental  (+) Dental Advisory Given   Pulmonary neg shortness of breath, neg sleep apnea, neg COPD, neg recent URI H/o pneumothorax   Pulmonary exam normal breath sounds clear to auscultation       Cardiovascular Exercise Tolerance: Good (-) hypertension(-) angina + CAD (non-obstructive) and + DOE  (-) Past MI, (-) Cardiac Stents and (-) CABG + dysrhythmias (PVCs, RBBB, on flecainide)  Rhythm:Regular Rate:Bradycardia  Carotid artery stenosis, HLD  Low-risk stress test 11/08/2022, walks 5 miles a day  TTE 11/12/2021: IMPRESSIONS     1. Left ventricular ejection fraction, by estimation, is 60 to 65%. The  left ventricle has normal function. The left ventricle has no regional  wall motion abnormalities. Left ventricular diastolic parameters were  normal.   2. Right ventricular systolic function is normal. The right ventricular  size is normal. There is normal pulmonary artery systolic pressure. The  estimated right ventricular systolic pressure is 31.5 mmHg.   3. Left atrial size was mildly dilated.   4. The mitral valve is abnormal. Mild mitral valve regurgitation.   5. The aortic valve is tricuspid. Aortic valve regurgitation is not  visualized. Aortic valve sclerosis is present, with no evidence of aortic  valve stenosis.   6. Aortic dilatation noted. There is borderline dilatation of the  ascending aorta, measuring 38 mm.   7. The inferior vena cava is normal in size with greater than 50%  respiratory variability, suggesting right atrial pressure of 3 mmHg.     Neuro/Psych negative  neurological ROS     GI/Hepatic negative GI ROS, Neg liver ROS,,,  Endo/Other  negative endocrine ROS    Renal/GU negative Renal ROS     Musculoskeletal  (+) Arthritis , Osteoarthritis,    Abdominal   Peds  Hematology negative hematology ROS (+)   Anesthesia Other Findings   Reproductive/Obstetrics                             Anesthesia Physical Anesthesia Plan  ASA: 2  Anesthesia Plan: General   Post-op Pain Management: Tylenol PO (pre-op)* and Regional block*   Induction: Intravenous  PONV Risk Score and Plan: 2  Airway Management Planned:   Additional Equipment:   Intra-op Plan:   Post-operative Plan: Extubation in OR  Informed Consent: I have reviewed the patients History and Physical, chart, labs and discussed the procedure including the risks, benefits and alternatives for the proposed anesthesia with the patient or authorized representative who has indicated his/her understanding and acceptance.     Dental advisory given  Plan Discussed with: CRNA and Anesthesiologist  Anesthesia Plan Comments: (Discussed potential risks of nerve blocks including, but not limited to, infection, bleeding, nerve damage, seizures, pneumothorax, respiratory depression, and potential failure of the block. Alternatives to nerve blocks discussed. All questions answered.  Risks of general anesthesia discussed including, but not limited to, sore throat, hoarse voice, chipped/damaged teeth, injury to vocal cords, nausea and vomiting, allergic reactions, lung infection, heart attack, stroke, and death. All questions answered. )        Anesthesia Quick Evaluation

## 2022-12-16 NOTE — Op Note (Signed)
Preoperative diagnosis: Reducible right inguinal hernia initial  Postoperative diagnosis: Direct right inguinal hernia reducible initial  Procedure: Repair of right inguinal hernia with ultra pro hernia system  Surgeon: Harriette Bouillon, MD  Anesthesia: General With transversus abdominis plane block, 0.25% Marcaine local and general  Drains: None  Specimen: None  EBL: Minimal  Indications for procedure: The patient is a 77 year old male with a reducible symptomatic right inguinal hernia.  He presents today for repair.The risk of hernia repair include bleeding,  Infection,   Recurrence of the hernia,  Mesh use, chronic pain,  Organ injury,  Bowel injury,  Bladder injury,   nerve injury with numbness around the incision,  Death,  and worsening of preexisting  medical problems.  The alternatives to surgery have been discussed as well..  Long term expectations of both operative and non operative treatments have been discussed.   The patient agrees to proceed.     Description of procedure: The patient was met in the holding area and questions were answered.  The right groin was marked as the correct site.  A block was placed by anesthesia.  He was taken back to the operating.  He is placed supine upon the operating table.  After induction of general esthesia, the right groin was prepped and draped in sterile fashion timeout performed.  Proper patient, site and procedure verified and he received appropriate preoperative antibiotics.  Local anesthetic of 0.25% Marcaine was infiltrated in the right inguinal crease.  A 5 cm incision was made.  Dissection was carried down through Scarpa's fascia until the aponeurosis of the external bleak was identified.  A small incision was made with a scalpel and the fibers were opened through the external ring in their direction.  The ilioinguinal nerve and cord structures were encircled with quarter inch Penrose drain.  He did have a direct defect.  This was dissected  away and reviewed and the preperitoneal fat was reduced back through the defect into the preperitoneal space.  A large ultra pro hernia system was used with the inner leaflet placed through the defect into the preperitoneal space and the onlay placed onto the floor of the inguinal canal.  The mesh was cut to allow the cord to pass through.  It was sutured with #1 Novafil to the shelving edge of the inguinal ligament, pubic tubercle and the conjoined tendon.  The ilioinguinal nerve was divided as it exited the muscle to prevent entrapment.  #1 Novafil was used to secure the mesh around the cord leaving ample space for the cord exit without any sort of constriction around it.  The repair laid nicely.  There is no undue tension.  There is no signs of bleeding.  There is no undue tension in the cord.  The aponeurosis of the external bleak was closed with 2-0 Vicryl.  3-0 Vicryl was used to approximate Scarpa's fascia and 4-0 Monocryl was used to close the skin in a subcuticular fashion.  Dermabond was applied.  All counts found to be correct.  The patient was awoke extubated taken to recovery in satisfactory condition.

## 2022-12-17 ENCOUNTER — Encounter (HOSPITAL_BASED_OUTPATIENT_CLINIC_OR_DEPARTMENT_OTHER): Payer: Self-pay | Admitting: Surgery

## 2023-01-08 ENCOUNTER — Other Ambulatory Visit: Payer: Self-pay | Admitting: Internal Medicine

## 2023-01-22 ENCOUNTER — Other Ambulatory Visit: Payer: Self-pay | Admitting: Student

## 2023-02-24 DIAGNOSIS — Z23 Encounter for immunization: Secondary | ICD-10-CM | POA: Diagnosis not present

## 2023-03-10 ENCOUNTER — Encounter: Payer: Self-pay | Admitting: Cardiology

## 2023-03-10 ENCOUNTER — Ambulatory Visit: Payer: PPO | Attending: Cardiology | Admitting: Cardiology

## 2023-03-10 VITALS — BP 110/68 | HR 46 | Ht 74.0 in | Wt 189.8 lb

## 2023-03-10 DIAGNOSIS — I495 Sick sinus syndrome: Secondary | ICD-10-CM | POA: Diagnosis not present

## 2023-03-10 DIAGNOSIS — I493 Ventricular premature depolarization: Secondary | ICD-10-CM | POA: Diagnosis not present

## 2023-03-10 NOTE — Progress Notes (Signed)
  Electrophysiology Office Note:   Date:  03/10/2023  ID:  Andrew Payne, DOB 10/05/1945, MRN [id number]  Primary Cardiologist: Andrew Pyo, MD Electrophysiologist: Andrew Lemming, MD      History of Present Illness:   Andrew Payne is a 77 y.o. male with h/o sick sinus syndrome, carotid stenosis, PVCs seen today for routine electrophysiology followup.   Since last being seen in our clinic the patient reports shortness of breath when he exerts himself such as going up hills.  He can climb a flight of stairs without issue.  He feels that his heart rate does not go up when he exerts himself out of the 90s.  Aside from that, he is able to do his daily activities.  He is unaware of PVCs.  he denies chest pain, palpitations, PND, orthopnea, nausea, vomiting, dizziness, syncope, edema, weight gain, or early satiety.   Review of systems complete and found to be negative unless listed in HPI.   EP Information / Studies Reviewed:    EKG is ordered today. Personal review as below.  EKG Interpretation Date/Time:  Thursday March 10 2023 10:07:18 EST Ventricular Rate:  46 PR Interval:  190 QRS Duration:  154 QT Interval:  472 QTC Calculation: 413 R Axis:   -76  Text Interpretation: Sinus bradycardia Left axis deviation Right bundle branch block When compared with ECG of 03-Nov-2022 13:33, Premature ventricular complexes are no longer Present Confirmed by Andrew Payne ([id number]) on 03/10/2023 10:09:16 AM     Risk Assessment/Calculations:              Physical Exam:   VS:  BP 110/68 (BP Location: Left Arm, Patient Position: Sitting, Cuff Size: Large)   Pulse (!) 46   Ht 6\' 2"  (1.88 m)   Wt 189 lb 12.8 oz (86.1 kg)   SpO2 97%   BMI 24.37 kg/m    Wt Readings from Last 3 Encounters:  03/10/23 189 lb 12.8 oz (86.1 kg)  12/16/22 185 lb 6.5 oz (84.1 kg)  11/08/22 186 lb (84.4 kg)     GEN: Well nourished, well developed in no acute distress NECK: No JVD; No carotid  bruits CARDIAC: Regular rate and rhythm, no murmurs, rubs, gallops RESPIRATORY:  Clear to auscultation without rales, wheezing or rhonchi  ABDOMEN: Soft, non-tender, non-distended EXTREMITIES:  No edema; No deformity   ASSESSMENT AND PLAN:    1.  Sick sinus syndrome: Continues to have resting bradycardia.  Able to increase heart rate on the treadmill.  No pacemaker plan.  2.  PVCs: Burden of 4.3%.  Currently on flecainide.  He has been short of breath.  He states that he is unable to get his heart rate to go up when he exerts himself.  Andrew Payne have him hold his flecainide for 2 weeks and see if this improves his symptoms.  Follow up with EP APP in 6 months  Signed, Andrew Mey Jorja Loa, MD

## 2023-03-10 NOTE — Patient Instructions (Addendum)
Medication Instructions:  Your physician recommends that you continue on your current medications as directed. Please refer to the Current Medication list given to you today.  ** HOLD YOUR FLECAINIDE FOR 2 WEEKS -- PLEASE LET THE OFFICE KNOW IF IMPROVEMENT IN SHORTNESS OF BREATH AFTER HOLDING THIS  *If you need a refill on your cardiac medications before your next appointment, please call your pharmacy*   Lab Work: None ordered   Testing/Procedures: None ordered   Follow-Up: At [facility], you and your health needs are our priority.  As part of our continuing mission to provide you with exceptional heart care, we have created designated Provider Care Teams.  These Care Teams include your primary Cardiologist (physician) and Advanced Practice Providers (APPs -  Physician Assistants and Nurse Practitioners) who all work together to provide you with the care you need, when you need it.  Your next appointment:   6 month(s)  The format for your next appointment:   In Person  Provider:   You will see one of the following Advanced Practice Providers on your designated Care Team:   Francis Dowse, [place] Baldwin Crown" Ragland, [place] Canary Brim, NP  {  Thank you for choosing [facility]!!   Dory Horn, RN [phone]

## 2023-03-18 DIAGNOSIS — H2513 Age-related nuclear cataract, bilateral: Secondary | ICD-10-CM | POA: Diagnosis not present

## 2023-03-18 DIAGNOSIS — H401131 Primary open-angle glaucoma, bilateral, mild stage: Secondary | ICD-10-CM | POA: Diagnosis not present

## 2023-03-25 ENCOUNTER — Encounter: Payer: Self-pay | Admitting: Cardiology

## 2023-04-01 NOTE — Telephone Encounter (Signed)
Left message to call back  

## 2023-04-04 ENCOUNTER — Ambulatory Visit: Payer: PPO | Admitting: Orthopaedic Surgery

## 2023-04-04 ENCOUNTER — Other Ambulatory Visit (INDEPENDENT_AMBULATORY_CARE_PROVIDER_SITE_OTHER): Payer: PPO

## 2023-04-04 ENCOUNTER — Encounter: Payer: Self-pay | Admitting: Orthopaedic Surgery

## 2023-04-04 ENCOUNTER — Other Ambulatory Visit (INDEPENDENT_AMBULATORY_CARE_PROVIDER_SITE_OTHER): Payer: Self-pay

## 2023-04-04 DIAGNOSIS — M25561 Pain in right knee: Secondary | ICD-10-CM | POA: Diagnosis not present

## 2023-04-04 DIAGNOSIS — M1711 Unilateral primary osteoarthritis, right knee: Secondary | ICD-10-CM | POA: Diagnosis not present

## 2023-04-04 DIAGNOSIS — R0781 Pleurodynia: Secondary | ICD-10-CM

## 2023-04-04 DIAGNOSIS — G8929 Other chronic pain: Secondary | ICD-10-CM

## 2023-04-04 MED ORDER — METHYLPREDNISOLONE ACETATE 40 MG/ML IJ SUSP
40.0000 mg | INTRAMUSCULAR | Status: AC | PRN
Start: 2023-04-04 — End: 2023-04-04
  Administered 2023-04-04: 40 mg via INTRA_ARTICULAR

## 2023-04-04 MED ORDER — LIDOCAINE HCL 1 % IJ SOLN
3.0000 mL | INTRAMUSCULAR | Status: AC | PRN
Start: 2023-04-04 — End: 2023-04-04
  Administered 2023-04-04: 3 mL

## 2023-04-04 NOTE — Telephone Encounter (Signed)
Pt is returning a call to the nurse 

## 2023-04-04 NOTE — Progress Notes (Signed)
The patient is well-known to [place].  He comes in today requesting a steroid injection in his right knee.  He has known arthritis of the right knee but about a month ago he lost balance on a stepstool and fell backwards landing on his back and he twisted his right knee behind him.  He has been having some right sided rib pain.  He does have a remote history of fractures of several ribs on the left side.  Those are chronic but the right side is more recent.  He is having pain at the mid thoracic ribs.  This is on the right side.  He denies any shortness of breath.  He also points the medial aspect of his right knee as a source of his pain.  Examination of the right knee shows no effusion.  There is significant varus malalignment that is barely correctable.  There is patellofemoral crepitation as well.  There is no instability of ligaments on exam.  2 views of the right knee show bone-on-bone wear the medial compartment with varus malalignment and patellofemoral compartment arthritis.  There is a loose body in the front of the knee.  This is not significantly changed from previous imaging studies.  X-rays of his ribs on the right side may show just a slight fracture of the mid thoracic rib but this is minimal.  He has his old fractures on several ribs on the left side.  I did place a steroid injection in his right knee today which he tolerated well.  He said if he gets to the point of considering knee replacement surgery he would once it start detrimentally affecting his quality of life and his mobility.  For now follow-up is as needed.    Procedure Note  Patient: Andrew Payne             Date of Birth: 1946-04-12           MRN: [id number]             Visit Date: 04/04/2023  Procedures: Visit Diagnoses:  1. Chronic pain of right knee   2. Rib pain on right side   3. Unilateral primary osteoarthritis, right knee     Large Joint Inj: R knee on 04/04/2023 11:08 AM Indications: diagnostic evaluation and  pain Details: 22 G 1.5 in needle, superolateral approach  Arthrogram: No  Medications: 3 mL lidocaine 1 %; 40 mg methylPREDNISolone acetate 40 MG/ML Outcome: tolerated well, no immediate complications Procedure, treatment alternatives, risks and benefits explained, specific risks discussed. Consent was given by the patient. Immediately prior to procedure a time out was called to verify the correct patient, procedure, equipment, support staff and site/side marked as required. Patient was prepped and draped in the usual sterile fashion.

## 2023-04-05 ENCOUNTER — Telehealth: Payer: Self-pay | Admitting: Cardiology

## 2023-04-05 NOTE — Telephone Encounter (Signed)
Spoke to pt today, see telephone note. Scheduled for EKG Monday 12/16

## 2023-04-05 NOTE — Telephone Encounter (Signed)
Scheduled for EKG Monday 12/16 @ 2?00 pm. Aware will follow up after Camnitz reviews the EKG. Patient agreeable to plan.

## 2023-04-05 NOTE — Telephone Encounter (Signed)
Pt states he would like to speak with Sherri. Please advise

## 2023-04-11 ENCOUNTER — Ambulatory Visit: Payer: PPO | Attending: Cardiology

## 2023-04-11 VITALS — BP 110/68 | HR 49 | Ht 74.0 in | Wt 188.8 lb

## 2023-04-11 DIAGNOSIS — I495 Sick sinus syndrome: Secondary | ICD-10-CM

## 2023-04-11 DIAGNOSIS — I493 Ventricular premature depolarization: Secondary | ICD-10-CM | POA: Diagnosis not present

## 2023-04-11 NOTE — Progress Notes (Unsigned)
   Nurse Visit   Date of Encounter: 04/11/2023 ID: Andrew Payne, DOB 1945/11/29, MRN [id number]  PCP:  Cleatis Polka., MD   [facility] Providers Cardiologist:  Orbie Pyo, MD Electrophysiologist:  Regan Lemming, MD { Click to update primary MD,subspecialty MD or APP then REFRESH:1}     Visit Details   VS:  BP 110/68 (BP Location: Left Arm, Patient Position: Sitting, Cuff Size: Normal)   Pulse (!) 49   Ht 6\' 2"  (1.88 m)   Wt 188 lb 12.8 oz (85.6 kg)   BMI 24.24 kg/m  , BMI Body mass index is 24.24 kg/m.  Wt Readings from Last 3 Encounters:  04/11/23 188 lb 12.8 oz (85.6 kg)  03/10/23 189 lb 12.8 oz (86.1 kg)  12/16/22 185 lb 6.5 oz (84.1 kg)     Reason for visit: EKG after stopping flecainide Performed today: Vitals, EKG, Provider consulted:Dr. Odis Hollingshead (DOD), and Education Changes (medications, testing, etc.) : None Length of Visit: 30 minutes  Patient seen today for nurse visit to perform follow-up EKG after holding flecainide. Per Dr. Elberta Fortis, flecainide held for 2 weeks at last office visit on 03/10/23 due to Pt c/o SOB. Pt reports he would experience SOB and dizziness when walking uphill, since holding the flecainide these episodes have become less frequent and less severe. Pt denies any CP, palpitations, weakness or fatigue. EKG reviewed by DOD Dr. Odis Hollingshead today--Qtc interval normal, no changes. Patient to continue holding flecainide, Dr. Elberta Fortis to review EKG and make final determination on this medication.   Medications Adjustments/Labs and Tests Ordered: Orders Placed This Encounter  Procedures   EKG 12-Lead   No orders of the defined types were placed in this encounter.    Signed, Franchot Gallo, RN  04/11/2023 2:30 PM

## 2023-04-11 NOTE — Patient Instructions (Signed)
Medication Instructions:  Your physician recommends that you continue on your current medications as directed. Please refer to the Current Medication list given to you today.  *If you need a refill on your cardiac medications before your next appointment, please call your pharmacy*   Lab Work: None ordered today.  Testing/Procedures: None ordered today.  Follow-Up: As scheduled.

## 2023-04-13 ENCOUNTER — Encounter: Payer: Self-pay | Admitting: Cardiology

## 2023-04-15 NOTE — Telephone Encounter (Signed)
Informed pt that waiting on MD review.   He states he is doing much better.  He still has some issues when starting walking, but it is no where near where it was before stopping the medication.  Aware that I do no believe MD will restart it at this time, but will await his advisement.  Patient understands I will follow up next week.

## 2023-05-09 DIAGNOSIS — L218 Other seborrheic dermatitis: Secondary | ICD-10-CM | POA: Diagnosis not present

## 2023-05-09 DIAGNOSIS — L905 Scar conditions and fibrosis of skin: Secondary | ICD-10-CM | POA: Diagnosis not present

## 2023-05-09 DIAGNOSIS — D225 Melanocytic nevi of trunk: Secondary | ICD-10-CM | POA: Diagnosis not present

## 2023-05-09 DIAGNOSIS — Z85828 Personal history of other malignant neoplasm of skin: Secondary | ICD-10-CM | POA: Diagnosis not present

## 2023-05-09 DIAGNOSIS — L821 Other seborrheic keratosis: Secondary | ICD-10-CM | POA: Diagnosis not present

## 2023-05-09 DIAGNOSIS — L570 Actinic keratosis: Secondary | ICD-10-CM | POA: Diagnosis not present

## 2023-05-09 DIAGNOSIS — L84 Corns and callosities: Secondary | ICD-10-CM | POA: Diagnosis not present

## 2023-05-09 DIAGNOSIS — D2261 Melanocytic nevi of right upper limb, including shoulder: Secondary | ICD-10-CM | POA: Diagnosis not present

## 2023-06-02 DIAGNOSIS — Z125 Encounter for screening for malignant neoplasm of prostate: Secondary | ICD-10-CM | POA: Diagnosis not present

## 2023-07-14 ENCOUNTER — Encounter: Payer: Self-pay | Admitting: Cardiology

## 2023-07-25 DIAGNOSIS — M25476 Effusion, unspecified foot: Secondary | ICD-10-CM | POA: Diagnosis not present

## 2023-07-25 DIAGNOSIS — M19071 Primary osteoarthritis, right ankle and foot: Secondary | ICD-10-CM | POA: Diagnosis not present

## 2023-07-25 DIAGNOSIS — M79674 Pain in right toe(s): Secondary | ICD-10-CM | POA: Diagnosis not present

## 2023-07-26 DIAGNOSIS — M79674 Pain in right toe(s): Secondary | ICD-10-CM | POA: Diagnosis not present

## 2023-08-22 DIAGNOSIS — L218 Other seborrheic dermatitis: Secondary | ICD-10-CM | POA: Diagnosis not present

## 2023-08-22 DIAGNOSIS — Z85828 Personal history of other malignant neoplasm of skin: Secondary | ICD-10-CM | POA: Diagnosis not present

## 2023-08-22 DIAGNOSIS — L821 Other seborrheic keratosis: Secondary | ICD-10-CM | POA: Diagnosis not present

## 2023-08-22 DIAGNOSIS — B078 Other viral warts: Secondary | ICD-10-CM | POA: Diagnosis not present

## 2023-08-22 DIAGNOSIS — L570 Actinic keratosis: Secondary | ICD-10-CM | POA: Diagnosis not present

## 2023-08-22 DIAGNOSIS — D692 Other nonthrombocytopenic purpura: Secondary | ICD-10-CM | POA: Diagnosis not present

## 2023-08-24 DIAGNOSIS — W19XXXA Unspecified fall, initial encounter: Secondary | ICD-10-CM | POA: Diagnosis not present

## 2023-08-24 DIAGNOSIS — S20212A Contusion of left front wall of thorax, initial encounter: Secondary | ICD-10-CM | POA: Diagnosis not present

## 2023-08-24 DIAGNOSIS — R0781 Pleurodynia: Secondary | ICD-10-CM | POA: Diagnosis not present

## 2023-09-12 NOTE — Progress Notes (Signed)
 Cardiology Office Note:  .   Date:  09/12/2023  ID:  Andrew Payne, DOB 11-Jan-1946, MRN [id number] PCP: Jeannine Milroy., MD  [facility] Providers Cardiologist:  Kyra Phy, MD Electrophysiologist:  Lei Pump, MD {  History of Present Illness: .   Andrew Payne is a 78 y.o. male w/PMHx of  PVD w/carotid stenosis (described as mild) 2019 cardiac CTA that showed calcium  score of 50 with mild CAD in the proximal and mid LAD  PVCs RBBB SSSx is discussed with resting bradycardia  Most recently he saw Dr. Lawana Pray Nov 2024, DOE with hills but not stairs, reported exertional HRs >90, no awareness of PVCs, no difficulties with ADLs Not felt to need a Pacer given good HR excursion Advised to hold flecainide  for 2 weeks and monitor symptoms  Ultimately he stayed off drug, did not feel any different/better/worse and decided to stay off it   Today's visit is scheduled as a 6 mo visit ROS:   More recently in April developed a case of gout > as part of the management of this he made a concentrated effort to drink plenty of water, with better hydration he feels really well, he is walking as usual including hills with excellent exertional capacity and thinks his symptoms in Nov perhaps were because he was generally dehydrated all of the time, drinkg previously a glass of OJ in the AM and perhaps one water a day or so only  He has resting rates 40's by his fitbit > 90's with walks No CP, SOB, DOE No dizzy spells, near syncope or syncope  Reports life long slow HRs 40's Longstanding runner, bike rider   Arrhythmia/AAD hx PVCs Flecainide  started 2022 >> stopped Nov/Dec 2024 with some DOE, reduction in exertional capacity  RBBB pre-dates flecainide    Studies Reviewed: Aaron Aas    EKG done today and reviewed by myself:  SB 38bpm, RBBB, LAD, PR , QRS , QTc    11/08/22: stress myoview    Findings are consistent with no ischemia. The study is low risk.    No ST deviation was noted.   LV perfusion is abnormal. Defect 1: There is a small defect with mild reduction in uptake present in the basal inferior location(s) that is fixed. There is normal wall motion in the defect area. Consistent with artifact caused by subdiaphragmatic activity.   Left ventricular function is normal. Nuclear stress EF: 50%. The left ventricular ejection fraction is mildly decreased (45-54%). End diastolic cavity size is mildly enlarged. End systolic cavity size is normal.   Prior study available for comparison from 04/15/2005.   Small size, mild intensity fixed basal inferior perfusion defect, likely artifact. No reversible ischemia. LVEF 50% with normal wall motion. This is a low risk study. Compared to a prior study in 2006, there are no significant changes.  HR this exam rest 40 > peak 60   11/12/21: TTE 1. Left ventricular ejection fraction, by estimation, is 60 to 65%. The  left ventricle has normal function. The left ventricle has no regional  wall motion abnormalities. Left ventricular diastolic parameters were  normal.   2. Right ventricular systolic function is normal. The right ventricular  size is normal. There is normal pulmonary artery systolic pressure. The  estimated right ventricular systolic pressure is 31.5 mmHg.   3. Left atrial size was mildly dilated.   4. The mitral valve is abnormal. Mild mitral valve regurgitation.   5. The aortic valve is tricuspid.  Aortic valve regurgitation is not  visualized. Aortic valve sclerosis is present, with no evidence of aortic  valve stenosis.   6. Aortic dilatation noted. There is borderline dilatation of the  ascending aorta, measuring 38 mm.   7. The inferior vena cava is normal in size with greater than 50%  respiratory variability, suggesting right atrial pressure of 3 mmHg.    Risk Assessment/Calculations:    Physical Exam:   VS:  There were no vitals taken for this visit.   Wt Readings from Last 3  Encounters:  04/11/23 188 lb 12.8 oz (85.6 kg)  03/10/23 189 lb 12.8 oz (86.1 kg)  12/16/22 185 lb 6.5 oz (84.1 kg)    GEN: Well nourished, well developed in no acute distress NECK: No JVD; No carotid bruits CARDIAC: RRR, bradycardic, no extrasystoles, no murmurs, rubs, gallops RESPIRATORY:  CTA b/l without rales, wheezing or rhonchi  ABDOMEN: Soft, non-tender, non-distended EXTREMITIES: No edema; No deformity   ASSESSMENT AND PLAN: .    PVCs RBBB Resting bradycardia Off flecainide  now No PVCs today on EKG or exam No symptoms of bradycardia  Via pulse Ox as we are chatting HR 40's  Repeat BP by myself is 116/60   Dispo: we can see him in 31mo to follow up again, sooner if needed  Signed, Debbie Fails, [name]

## 2023-09-13 DIAGNOSIS — H2513 Age-related nuclear cataract, bilateral: Secondary | ICD-10-CM | POA: Diagnosis not present

## 2023-09-13 DIAGNOSIS — H401131 Primary open-angle glaucoma, bilateral, mild stage: Secondary | ICD-10-CM | POA: Diagnosis not present

## 2023-09-14 ENCOUNTER — Ambulatory Visit: Attending: Physician Assistant | Admitting: Physician Assistant

## 2023-09-14 VITALS — BP 153/86 | HR 38 | Ht 74.0 in | Wt 183.0 lb

## 2023-09-14 DIAGNOSIS — I493 Ventricular premature depolarization: Secondary | ICD-10-CM | POA: Diagnosis not present

## 2023-09-14 DIAGNOSIS — I4510 Unspecified right bundle-branch block: Secondary | ICD-10-CM

## 2023-09-14 DIAGNOSIS — R001 Bradycardia, unspecified: Secondary | ICD-10-CM

## 2023-09-14 NOTE — Patient Instructions (Signed)
 Medication Instructions:   Your physician recommends that you continue on your current medications as directed. Please refer to the Current Medication list given to you today.   *If you need a refill on your cardiac medications before your next appointment, please call your pharmacy*   Lab Work: NONE ORDERED  TODAY    If you have labs (blood work) drawn today and your tests are completely normal, you will receive your results only by: MyChart Message (if you have MyChart) OR A paper copy in the mail If you have any lab test that is abnormal or we need to change your treatment, we will call you to review the results.   Testing/Procedures:  NONE ORDERED  TODAY     Follow-Up: At [facility], you and your health needs are our priority.  As part of our continuing mission to provide you with exceptional heart care, our providers are all part of one team.  This team includes your primary Cardiologist (physician) and Advanced Practice Providers or APPs (Physician Assistants and Nurse Practitioners) who all work together to provide you with the care you need, when you need it.   Your next appointment:    6 month(s)   Provider:    Agatha Horsfall, MD or Mertha Abrahams, [name]   We recommend signing up for the patient portal called "MyChart".  Sign up information is provided on this After Visit Summary.  MyChart is used to connect with patients for Virtual Visits (Telemedicine).  Patients are able to view lab/test results, encounter notes, upcoming appointments, etc.  Non-urgent messages can be sent to your provider as well.   To learn more about what you can do with MyChart, go to [email or web address].   Other Instructions

## 2023-11-01 DIAGNOSIS — R3589 Other polyuria: Secondary | ICD-10-CM | POA: Diagnosis not present

## 2023-11-01 DIAGNOSIS — R001 Bradycardia, unspecified: Secondary | ICD-10-CM | POA: Diagnosis not present

## 2023-11-01 DIAGNOSIS — G4762 Sleep related leg cramps: Secondary | ICD-10-CM | POA: Diagnosis not present

## 2023-11-16 NOTE — Progress Notes (Signed)
 Cardiology Office Note:   Date:  11/22/2023  ID:  Andrew Payne, DOB December 11, 1945, MRN [id number] PCP:  Andrew Payne., MD  [facility] Providers Cardiologist:  Wendel Haws, MD Referring MD: Andrew Payne., MD  Chief Complaint/Reason for Referral: Follow-up coronary artery disease ASSESSMENT:    1. Coronary artery disease involving native coronary artery of native heart without angina pectoris   2. Sick sinus syndrome (HCC)   3. Hyperlipidemia LDL goal <70   4. Aortic atherosclerosis (HCC)   5. CKD (chronic kidney disease) stage 2, GFR 60-89 ml/min   6. Sinus bradycardia   7. Bifascicular block     PLAN:   In order of problems listed above: CAD: Mild on coronary CTA.  Continue aspirin  81 mg and atorvastatin  40 mg.  Chest discomfort symptoms are transient with exercise.  Monitor for now. Sick sinus syndrome: Followed by EP.  On no rate controlling agents. Hyperlipidemia: Check lipid panel and LFTs today.  Continue atorvastatin  40 mg. Aortic atherosclerosis: Continue aspirin  81 mg and atorvastatin  40 mg. CKD stage II: Discussed starting Jardiance for renal protection.  Patient would like to defer unless renal function gets worse.  I do not think an ARB is necessary at this time as he is normotensive. Sinus bradycardia: Followed by EP; no indication currently for pacemaker. Bifascicular block: Followed by EP.            Dispo:  Return in about 9 months (around 08/22/2024).       Labs/tests ordered: Orders Placed This Encounter  Procedures   Lipid panel   Hepatic function panel    Current medicines are reviewed at length with the patient today.  The patient does not have concerns regarding medicines.  I spent 33 minutes reviewing all clinical data during and prior to this visit including all relevant imaging studies, laboratories, clinical information from other health systems and prior notes from both Cardiology and other specialties, interviewing the patient,  conducting a complete physical examination, and coordinating care in order to formulate a comprehensive and personalized evaluation and treatment plan.   History of Present Illness:    FOCUSED PROBLEM LIST:   Sick sinus syndrome No PPM yet indicated  Followed by EP CAD Mild LAD, left circumflex, and RCA disease coronary CTA 2023 Low risk Lexiscan  stress test 2024 Hyperlipidemia LP(a) 9.4 Aortic atherosclerosis Chest CT 2023 CKD stage II LAFB+ RBBB BMI 15 November 2021: The patient is a 78 y.o. male with the indicated medical history here for chest pain.  The patient has developed chest pain with exertion.  When he walks up the hill he will get chest tightness and dyspnea.  He tells me this does not really have been going downhill or walking on flat ground however he after telling me this he then said at the [facility] he was walking around the track and he developed the same symptoms.  It improved after about 1 or 2 laps however.  He does not develop any symptoms at rest.  He denies any presyncope or syncope.  He denies any paroxysmal nocturnal dyspnea or orthopnea.  He has had no severe bleeding or bruising.  He has not required emergency room visits or hospitalizations recently.  He does not smoke or drink.  He has had no other issues with his medications.  Plan: Obtain coronary CTA, echocardiogram, start aspirin  81 mg and atorvastatin  40 mg, check lipid panel, CMP, and LP(a) in 2 months.  Start Imdur  30 mg.  July 2025:  Patient consents to use of AI scribe. In the interim the patient had a coronary CTA which demonstrated only mild nonobstructive disease.  He also had a Lexiscan  stress test which demonstrated low risk findings.  He was seen last July for preoperative clearance.  At that time he was doing well.  He was seen by EP in May and no changes were made to his medical regimen.  He was off of flecainide  at that time due to bradycardia.  He experiences chest pressure when walking uphill,  accompanied by lightheadedness and body fatigue. These symptoms have been intermittent over a long period. Increasing water intake in April initially alleviated these symptoms, but he has returned in the past two to three weeks. Currently, the pressure occurs during the first thirty minutes of walking, regardless of incline, and resolves thereafter. No significant lightheadedness or difficulty breathing during these episodes.  He has a history of a significant fall two years ago, resulting in fifteen fractured ribs. About two months ago, he sustained a bruise to the same area after tripping and falling against a concrete stoop. While the initial pain subsided, there has been a resurgence of pain in the last week and a half, described as severe at times, particularly with certain movements.  He has a history of mild kidney dysfunction. No new symptoms related to this condition.  During a recent trip to [place], [place], he experienced some leg swelling, attributed to the high temperatures, but this resolved after the trip. No current swelling in his legs.     Current Medications: Current Meds  Medication Sig   aspirin  EC 81 MG tablet Take 1 tablet (81 mg total) by mouth daily. Swallow whole.   atorvastatin  (LIPITOR ) 40 MG tablet TAKE 1 TABLET DAILY   Cholecalciferol (VITAMIN D3) 1000 units CAPS Take 1 capsule by mouth daily.   dorzolamide  (TRUSOPT ) 2 % ophthalmic solution Place 1 drop into the right eye daily.    ipratropium (ATROVENT) 0.03 % nasal spray as needed for rhinitis.   latanoprost  (XALATAN ) 0.005 % ophthalmic solution Place 1 drop into the right eye at bedtime.    tamsulosin (FLOMAX) 0.4 MG CAPS capsule 1 capsule.     Review of Systems:   Please see the history of present illness.    All other systems reviewed and are negative.     EKGs/Labs/Other Test Reviewed:   EKG: 2025 sinus bradycardia, left anterior fascicular block, right bundle branch block  EKG  Interpretation Date/Time:    Ventricular Rate:    PR Interval:    QRS Duration:    QT Interval:    QTC Calculation:   R Axis:      Text Interpretation:           Risk Assessment/Calculations:          Physical Exam:   VS:  BP 126/64   Pulse (!) 45   Ht 6' 2 (1.88 m)   Wt 184 lb (83.5 kg)   SpO2 98%   BMI 23.62 kg/m        Wt Readings from Last 3 Encounters:  11/22/23 184 lb (83.5 kg)  09/14/23 183 lb (83 kg)  04/11/23 188 lb 12.8 oz (85.6 kg)      GENERAL:  No apparent distress, AOx3 HEENT:  No carotid bruits, +2 carotid impulses, no scleral icterus CAR: RRR no murmurs, gallops, rubs, or thrills RES:  Clear to auscultation bilaterally ABD:  Soft, nontender, nondistended, positive bowel sounds x 4  VASC:  +2 radial pulses, +2 carotid pulses NEURO:  CN 2-12 grossly intact; motor and sensory grossly intact PSYCH:  No active depression or anxiety EXT:  No edema, ecchymosis, or cyanosis  Signed, Andrew Manganiello K Amaryllis Malmquist, MD  11/22/2023 10:50 AM    [facility] [address] [place], [place], [place]  [address] Phone: [phone]; Fax: [phone]   Note:  This document was prepared using Dragon voice recognition software and may include unintentional dictation errors.

## 2023-11-22 ENCOUNTER — Ambulatory Visit: Attending: Internal Medicine | Admitting: Internal Medicine

## 2023-11-22 ENCOUNTER — Encounter: Payer: Self-pay | Admitting: Internal Medicine

## 2023-11-22 VITALS — BP 126/64 | HR 45 | Ht 74.0 in | Wt 184.0 lb

## 2023-11-22 DIAGNOSIS — E785 Hyperlipidemia, unspecified: Secondary | ICD-10-CM

## 2023-11-22 DIAGNOSIS — I452 Bifascicular block: Secondary | ICD-10-CM

## 2023-11-22 DIAGNOSIS — R001 Bradycardia, unspecified: Secondary | ICD-10-CM

## 2023-11-22 DIAGNOSIS — N182 Chronic kidney disease, stage 2 (mild): Secondary | ICD-10-CM

## 2023-11-22 DIAGNOSIS — I495 Sick sinus syndrome: Secondary | ICD-10-CM

## 2023-11-22 DIAGNOSIS — I2510 Atherosclerotic heart disease of native coronary artery without angina pectoris: Secondary | ICD-10-CM | POA: Diagnosis not present

## 2023-11-22 DIAGNOSIS — M5134 Other intervertebral disc degeneration, thoracic region: Secondary | ICD-10-CM | POA: Diagnosis not present

## 2023-11-22 DIAGNOSIS — S2241XS Multiple fractures of ribs, right side, sequela: Secondary | ICD-10-CM | POA: Diagnosis not present

## 2023-11-22 DIAGNOSIS — I700 Atherosclerosis of aorta: Secondary | ICD-10-CM | POA: Diagnosis not present

## 2023-11-22 DIAGNOSIS — R0781 Pleurodynia: Secondary | ICD-10-CM | POA: Diagnosis not present

## 2023-11-22 LAB — LIPID PANEL
Chol/HDL Ratio: 1.9 ratio (ref 0.0–5.0)
Cholesterol, Total: 150 mg/dL (ref 100–199)
HDL: 80 mg/dL (ref >39)
LDL Chol Calc (NIH): 58 mg/dL (ref 0–99)
Triglycerides: 57 mg/dL (ref 0–149)
VLDL Cholesterol Cal: 12 mg/dL (ref 5–40)

## 2023-11-22 LAB — HEPATIC FUNCTION PANEL
ALT: 29 IU/L (ref 0–44)
AST: 33 IU/L (ref 0–40)
Albumin: 4.1 g/dL (ref 3.8–4.8)
Alkaline Phosphatase: 80 IU/L (ref 44–121)
Bilirubin Total: 0.8 mg/dL (ref 0.0–1.2)
Bilirubin, Direct: 0.3 mg/dL (ref 0.00–0.40)
Total Protein: 6.7 g/dL (ref 6.0–8.5)

## 2023-11-22 NOTE — Patient Instructions (Signed)
 Medication Instructions:  No changes *If you need a refill on your cardiac medications before your next appointment, please call your pharmacy*  Lab Work: Today: lipids, liver function - first floor [facility]  If you have labs (blood work) drawn today and your tests are completely normal, you will receive your results only by: MyChart Message (if you have MyChart) OR A paper copy in the mail If you have any lab test that is abnormal or we need to change your treatment, we will call you to review the results.  Testing/Procedures: none  Follow-Up: At [facility], you and your health needs are our priority.  As part of our continuing mission to provide you with exceptional heart care, our providers are all part of one team.  This team includes your primary Cardiologist (physician) and Advanced Practice Providers or APPs (Physician Assistants and Nurse Practitioners) who all work together to provide you with the care you need, when you need it.  Your next appointment:   9 month(s)  Provider:   Glendia Ferrier, [name]

## 2023-11-23 ENCOUNTER — Ambulatory Visit: Payer: Self-pay | Admitting: Internal Medicine

## 2023-11-28 DIAGNOSIS — Z85828 Personal history of other malignant neoplasm of skin: Secondary | ICD-10-CM | POA: Diagnosis not present

## 2023-11-28 DIAGNOSIS — L570 Actinic keratosis: Secondary | ICD-10-CM | POA: Diagnosis not present

## 2023-11-28 DIAGNOSIS — B078 Other viral warts: Secondary | ICD-10-CM | POA: Diagnosis not present

## 2023-12-01 ENCOUNTER — Ambulatory Visit: Admitting: Orthopaedic Surgery

## 2023-12-01 ENCOUNTER — Other Ambulatory Visit (INDEPENDENT_AMBULATORY_CARE_PROVIDER_SITE_OTHER): Payer: Self-pay

## 2023-12-01 DIAGNOSIS — M25512 Pain in left shoulder: Secondary | ICD-10-CM

## 2023-12-01 DIAGNOSIS — M1711 Unilateral primary osteoarthritis, right knee: Secondary | ICD-10-CM

## 2023-12-01 DIAGNOSIS — G8929 Other chronic pain: Secondary | ICD-10-CM

## 2023-12-01 DIAGNOSIS — M25561 Pain in right knee: Secondary | ICD-10-CM

## 2023-12-01 MED ORDER — LIDOCAINE HCL 1 % IJ SOLN
1.0000 mL | INTRAMUSCULAR | Status: AC | PRN
Start: 2023-12-01 — End: 2023-12-01
  Administered 2023-12-01: 1 mL

## 2023-12-01 MED ORDER — METHYLPREDNISOLONE ACETATE 40 MG/ML IJ SUSP
40.0000 mg | INTRAMUSCULAR | Status: AC | PRN
Start: 2023-12-01 — End: 2023-12-01
  Administered 2023-12-01: 40 mg via INTRAMUSCULAR

## 2023-12-01 MED ORDER — METHYLPREDNISOLONE ACETATE 40 MG/ML IJ SUSP
40.0000 mg | INTRAMUSCULAR | Status: AC | PRN
Start: 2023-12-01 — End: 2023-12-01
  Administered 2023-12-01: 40 mg via INTRA_ARTICULAR

## 2023-12-01 MED ORDER — LIDOCAINE HCL 1 % IJ SOLN
3.0000 mL | INTRAMUSCULAR | Status: AC | PRN
Start: 2023-12-01 — End: 2023-12-01
  Administered 2023-12-01: 3 mL

## 2023-12-01 NOTE — Progress Notes (Signed)
 The patient is very well-known to me.  He has well-documented osteoarthritis involving mainly the medial compartment of his right knee.  We last placed a steroid injection in the right knee sometime ago.  He does get pain in the knee mainly with ambulating especially when he goes on long walks.  He would like to have a steroid injection again today but definitely be considered for hyaluronic acid because that helped treat his long term osteoarthritis pain of that knee and that has been effective for him up until recently.  He is also been dealing with some left shoulder pain.  He has a history of a rotator cuff repair performed by Dr. Anderson back in 2022.  He does point to the trapezius area though is a source of his pain on that side.  Examination of his left shoulder show he has really good range of motion and pretty good strength.  It does not seem to be a rotator cuff issue.  It feels to me like he has trigger point pain and I can feel a area of just tight knotted muscle in the trapezius area of his shoulder.  His right knee does show a mild effusion with varus malalignment is correctable.  There is significant medial joint line tenderness but good range of motion and that knee is ligamentously stable.  3 views of the left shoulder are unremarkable.  Previous x-rays of the right knee show medial compartment arthritis with varus malalignment.  I did place a steroid injection in his left trapezius area for a trigger point injection and then also place a steroid injection in his right knee.  We will order hyaluronic acid for the right knee and see him back for that injection when it is ordered and approved.  I have recommended that he try Voltaren gel on the trapezius area and his knee as well as the area on his low back to the right side where he is injured a rib recently.  This patient is diagnosed with osteoarthritis of the knee(s).    Radiographs show evidence of joint space narrowing, osteophytes,  subchondral sclerosis and/or subchondral cysts.  This patient has knee pain which interferes with functional and activities of daily living.    This patient has experienced inadequate response, adverse effects and/or intolerance with conservative treatments such as acetaminophen , NSAIDS, topical creams, physical therapy or regular exercise, knee bracing and/or weight loss.   This patient has experienced inadequate response or has a contraindication to intra articular steroid injections for at least 3 months.   This patient is not scheduled to have a total knee replacement within 6 months of starting treatment with viscosupplementation.    Procedure Note  Patient: Andrew Payne             Date of Birth: 1945-05-13           MRN: [id number]             Visit Date: 12/01/2023  Procedures: Visit Diagnoses:  1. Chronic left shoulder pain   2. Unilateral primary osteoarthritis, right knee   3. Chronic pain of right knee   4. Trigger point of left shoulder region     Large Joint Inj: R knee on 12/01/2023 5:18 PM Indications: diagnostic evaluation and pain Details: 22 G 1.5 in needle, superolateral approach  Arthrogram: No  Medications: 3 mL lidocaine  1 %; 40 mg methylPREDNISolone  acetate 40 MG/ML Outcome: tolerated well, no immediate complications Procedure, treatment alternatives, risks and benefits explained, specific  risks discussed. Consent was given by the patient. Immediately prior to procedure a time out was called to verify the correct patient, procedure, equipment, support staff and site/side marked as required. Patient was prepped and draped in the usual sterile fashion.    Trigger Point Inj  Date/Time: 12/01/2023 5:19 PM  Performed by: Vernetta Lonni GRADE, MD Authorized by: Vernetta Lonni GRADE, MD   Total # of Trigger Points:  1 Location: shoulder   Medications #1:  1 mL lidocaine  1 %; 40 mg methylPREDNISolone  acetate 40 MG/ML

## 2023-12-02 ENCOUNTER — Telehealth: Payer: Self-pay

## 2023-12-02 NOTE — Telephone Encounter (Signed)
 Right knee gel injection

## 2023-12-09 NOTE — Telephone Encounter (Signed)
 VOB submitted for Orthovisc, right knee

## 2023-12-14 ENCOUNTER — Telehealth: Payer: Self-pay

## 2023-12-14 ENCOUNTER — Other Ambulatory Visit: Payer: Self-pay

## 2023-12-14 ENCOUNTER — Ambulatory Visit: Admitting: Orthopaedic Surgery

## 2023-12-14 DIAGNOSIS — M1711 Unilateral primary osteoarthritis, right knee: Secondary | ICD-10-CM

## 2023-12-14 NOTE — Telephone Encounter (Signed)
 Patient checking the status of gel injection

## 2023-12-14 NOTE — Telephone Encounter (Signed)
 Called and left a VM for patient to CB to schedule 3 appts.with Dr. Vernetta or Tory for gel injections.  See referrals tab

## 2023-12-21 DIAGNOSIS — I25119 Atherosclerotic heart disease of native coronary artery with unspecified angina pectoris: Secondary | ICD-10-CM | POA: Diagnosis not present

## 2023-12-21 DIAGNOSIS — Z1389 Encounter for screening for other disorder: Secondary | ICD-10-CM | POA: Diagnosis not present

## 2023-12-21 DIAGNOSIS — E785 Hyperlipidemia, unspecified: Secondary | ICD-10-CM | POA: Diagnosis not present

## 2023-12-21 DIAGNOSIS — Z125 Encounter for screening for malignant neoplasm of prostate: Secondary | ICD-10-CM | POA: Diagnosis not present

## 2023-12-28 ENCOUNTER — Encounter: Payer: Self-pay | Admitting: Internal Medicine

## 2023-12-28 ENCOUNTER — Other Ambulatory Visit (HOSPITAL_COMMUNITY): Payer: Self-pay | Admitting: Internal Medicine

## 2023-12-28 DIAGNOSIS — Z860101 Personal history of adenomatous and serrated colon polyps: Secondary | ICD-10-CM | POA: Diagnosis not present

## 2023-12-28 DIAGNOSIS — R82998 Other abnormal findings in urine: Secondary | ICD-10-CM | POA: Diagnosis not present

## 2023-12-28 DIAGNOSIS — Z1339 Encounter for screening examination for other mental health and behavioral disorders: Secondary | ICD-10-CM | POA: Diagnosis not present

## 2023-12-28 DIAGNOSIS — I209 Angina pectoris, unspecified: Secondary | ICD-10-CM | POA: Diagnosis not present

## 2023-12-28 DIAGNOSIS — I2510 Atherosclerotic heart disease of native coronary artery without angina pectoris: Secondary | ICD-10-CM | POA: Diagnosis not present

## 2023-12-28 DIAGNOSIS — R350 Frequency of micturition: Secondary | ICD-10-CM | POA: Diagnosis not present

## 2023-12-28 DIAGNOSIS — H409 Unspecified glaucoma: Secondary | ICD-10-CM | POA: Diagnosis not present

## 2023-12-28 DIAGNOSIS — M109 Gout, unspecified: Secondary | ICD-10-CM | POA: Diagnosis not present

## 2023-12-28 DIAGNOSIS — I6529 Occlusion and stenosis of unspecified carotid artery: Secondary | ICD-10-CM

## 2023-12-28 DIAGNOSIS — I493 Ventricular premature depolarization: Secondary | ICD-10-CM | POA: Diagnosis not present

## 2023-12-28 DIAGNOSIS — Z1331 Encounter for screening for depression: Secondary | ICD-10-CM | POA: Diagnosis not present

## 2023-12-28 DIAGNOSIS — I495 Sick sinus syndrome: Secondary | ICD-10-CM | POA: Diagnosis not present

## 2023-12-28 DIAGNOSIS — N1831 Chronic kidney disease, stage 3a: Secondary | ICD-10-CM | POA: Diagnosis not present

## 2023-12-28 DIAGNOSIS — Z0000 Encounter for general adult medical examination without abnormal findings: Secondary | ICD-10-CM | POA: Diagnosis not present

## 2023-12-28 DIAGNOSIS — M179 Osteoarthritis of knee, unspecified: Secondary | ICD-10-CM | POA: Diagnosis not present

## 2023-12-28 DIAGNOSIS — M898X1 Other specified disorders of bone, shoulder: Secondary | ICD-10-CM | POA: Diagnosis not present

## 2023-12-28 DIAGNOSIS — E785 Hyperlipidemia, unspecified: Secondary | ICD-10-CM | POA: Diagnosis not present

## 2024-01-02 ENCOUNTER — Ambulatory Visit (HOSPITAL_COMMUNITY)
Admission: RE | Admit: 2024-01-02 | Discharge: 2024-01-02 | Disposition: A | Discharge status: Home / Self Care | Source: Ambulatory Visit | Attending: Cardiovascular Disease | Admitting: Cardiovascular Disease

## 2024-01-02 ENCOUNTER — Ambulatory Visit: Admitting: Physician Assistant

## 2024-01-02 ENCOUNTER — Encounter: Payer: Self-pay | Admitting: Physician Assistant

## 2024-01-02 DIAGNOSIS — M1711 Unilateral primary osteoarthritis, right knee: Secondary | ICD-10-CM | POA: Diagnosis not present

## 2024-01-02 DIAGNOSIS — I6529 Occlusion and stenosis of unspecified carotid artery: Secondary | ICD-10-CM | POA: Insufficient documentation

## 2024-01-02 DIAGNOSIS — I6523 Occlusion and stenosis of bilateral carotid arteries: Secondary | ICD-10-CM

## 2024-01-02 DIAGNOSIS — M25512 Pain in left shoulder: Secondary | ICD-10-CM

## 2024-01-02 MED ORDER — HYALURONAN 30 MG/2ML IX SOSY
30.0000 mg | PREFILLED_SYRINGE | INTRA_ARTICULAR | Status: AC | PRN
Start: 2024-01-02 — End: 2024-01-02
  Administered 2024-01-02: 30 mg via INTRA_ARTICULAR

## 2024-01-02 MED ORDER — LIDOCAINE HCL 1 % IJ SOLN
3.0000 mL | INTRAMUSCULAR | Status: AC | PRN
Start: 2024-01-02 — End: 2024-01-02
  Administered 2024-01-02: 3 mL

## 2024-01-02 NOTE — Progress Notes (Signed)
 Carotid artery duplex has been completed. Preliminary results can be found in CV Proc through chart review.   01/02/24 11:02 AM Cathlyn Collet RVT

## 2024-01-02 NOTE — Progress Notes (Signed)
   Procedure Note  Patient: Andrew Payne             Date of Birth: 15-Apr-1946           MRN: [id number]             Visit Date: 01/02/2024 HPI: [name] 78 year old male comes in today for right knee Orthovisc injection schedule.  He states he has had no new injury to the knee.  Ranks pain to be 7 out of 10 pain.  Has known osteoarthritis of the right knee.  Has no scheduled surgery on the right knee in the next 6 months.  Has failed conservative treatment including injections exercise and medications. Also notes that he is having left shoulder pain he was given a trigger point injection by Dr. Vernetta which gave him good relief for less than 2 weeks.  Denies any radicular symptoms denies any chest pain.  He is asking about dry needling.  Review of systems: See HPI otherwise  Physical exam: General Well-developed well-nourished male no acute distress.  Affect appropriate. Right knee: Good range of motion.  Slight edema no abnormal warmth.  Tenderness medial joint line. Left shoulder: Nontender over the medial border of scapula.  Pin point tenderness medial lateral aspect of the left trapezius.  Procedures: Visit Diagnoses:  1. Unilateral primary osteoarthritis, right knee     Large Joint Inj: R knee on 01/02/2024 9:52 AM Indications: pain Details: 22 G 1.5 in needle, superolateral approach  Arthrogram: No  Medications: 30 mg Hyaluronan 30 MG/2ML; 3 mL lidocaine  1 % Outcome: tolerated well, no immediate complications Procedure, treatment alternatives, risks and benefits explained, specific risks discussed. Consent was given by the patient. Immediately prior to procedure a time out was called to verify the correct patient, procedure, equipment, support staff and site/side marked as required. Patient was prepped and draped in the usual sterile fashion.     Plan: Will see him back in 1 week for his second Orthovisc injection.  Will set him up for dry needling to the left shoulder trigger  point region.  Questions were encouraged and answered at length.

## 2024-01-03 ENCOUNTER — Other Ambulatory Visit: Payer: Self-pay | Admitting: Radiology

## 2024-01-03 DIAGNOSIS — M25512 Pain in left shoulder: Secondary | ICD-10-CM

## 2024-01-12 ENCOUNTER — Ambulatory Visit: Admitting: Orthopaedic Surgery

## 2024-01-12 DIAGNOSIS — M1711 Unilateral primary osteoarthritis, right knee: Secondary | ICD-10-CM | POA: Diagnosis not present

## 2024-01-12 MED ORDER — HYALURONAN 30 MG/2ML IX SOSY
30.0000 mg | PREFILLED_SYRINGE | INTRA_ARTICULAR | Status: AC | PRN
Start: 2024-01-12 — End: 2024-01-12
  Administered 2024-01-12: 30 mg via INTRA_ARTICULAR

## 2024-01-12 NOTE — Progress Notes (Signed)
   Procedure Note  Patient: Andrew Payne             Date of Birth: 04-18-1946           MRN: [id number]             Visit Date: 01/12/2024  Procedures: Visit Diagnoses:  1. Unilateral primary osteoarthritis, right knee     Large Joint Inj: R knee on 01/12/2024 3:34 PM Indications: diagnostic evaluation and pain Details: 22 G 1.5 in needle, superolateral approach  Arthrogram: No  Medications: 30 mg Hyaluronan 30 MG/2ML Outcome: tolerated well, no immediate complications Procedure, treatment alternatives, risks and benefits explained, specific risks discussed. Consent was given by the patient. Immediately prior to procedure a time out was called to verify the correct patient, procedure, equipment, support staff and site/side marked as required. Patient was prepped and draped in the usual sterile fashion.    The patient is here today for injection #2 of a series of 3 hyaluronic acid injections in his right knee to treat the pain from osteoarthritis.  He has no effusion of that knee today.  He did not get any results from the first injection but he understands a series 3 injections and it it may take 4 to 6 weeks to know if it is going to help.    We did place Orthovisc No. 2 of a series of 3 injections in his right knee today.  He tolerated this well.  Will see him back next week for injection #3.  Lot #[id number]

## 2024-01-18 NOTE — Therapy (Signed)
 OUTPATIENT PHYSICAL THERAPY UPPER EXTREMITY EVALUATION   Patient Name: Andrew Payne MRN: [id number] DOB:07-Nov-1945, 78 y.o., male Today's Date: 01/20/2024  END OF SESSION:  PT End of Session - 01/20/24 0847     Visit Number 1    Number of Visits 6    Date for Recertification  03/02/24    Authorization Type [employer] $[id number]    Progress Note Due on Visit 10    PT Start Time 0845    PT Stop Time 0928    PT Time Calculation (min) 43 min    Activity Tolerance Patient tolerated treatment well    Behavior During Therapy [facility] for tasks assessed/performed          Past Medical History:  Diagnosis Date   Bradycardia    cardiology-- dr inocencio--  work-up done included event monitor and echo (all in epic)   Carotid artery stenosis    per pt H&P dated 10-20-2018 --  <40%   Coronary atherosclerosis    Glaucoma, right eye    History of nuclear stress test 04/15/2005   (in epic)  for near syncope--- Low risk normal no ischemia, ef 57%   Hx of adenomatous colonic polyps 05/27/2016   Internal hemorrhoids    Morton neuroma, left    OA (osteoarthritis)    wrist, elbow, thumb, knees   PONV (postoperative nausea and vomiting)    PVC's (premature ventricular contractions)    RBBB (right bundle branch block)    States heart rate has been as low as 28 and asyptomatic   Past Surgical History:  Procedure Laterality Date   COLONOSCOPY  05/2016   TA   ELBOW SURGERY Left 04/2017   EXCISION MORTON'S NEUROMA Left 10/23/2018   Procedure: EXCISION MORTON'S NEUROMA LEFT FOOT;  Surgeon: Zan Factor, DPM;  Location: [facility];  Service: Podiatry;  Laterality: Left;   HEMORRHOID BANDING  2018   INGUINAL HERNIA REPAIR Left 06/14/2017   Procedure: LEFT INGUINAL HERNIA REPAIR ERAS PATHWAY;  Surgeon: Vanderbilt Ned, MD;  Location: [facility];  Service: General;  Laterality: Left;   INGUINAL HERNIA REPAIR Right 11-18-2009   dr tanda  @[facility]    INGUINAL HERNIA REPAIR  Right 12/16/2022   Procedure: RIGHT HERNIA REPAIR INGUINAL WITH MESH;  Surgeon: Vanderbilt Ned, MD;  Location: [facility];  Service: General;  Laterality: Right;  TAP BLOCK/GEN   INSERTION OF MESH Left 06/14/2017   Procedure: INSERTION OF MESH;  Surgeon: Vanderbilt Ned, MD;  Location: [facility];  Service: General;  Laterality: Left;   KNEE ARTHROSCOPY W/ MENISCAL REPAIR Right 09/2015   POLYPECTOMY     TONSILLECTOMY AND ADENOIDECTOMY  child   Patient Active Problem List   Diagnosis Date Noted   Impingement syndrome of left shoulder 02/11/2022   Multiple rib fractures 01/27/2022   Pneumothorax 01/27/2022   DOE (dyspnea on exertion) 12/30/2021   Nontraumatic incomplete tear of left rotator cuff 03/13/2020   Unilateral primary osteoarthritis, right knee 11/01/2017   Chest pain 08/07/2017   Bradycardia 08/07/2017   History of right tennis elbow 05/09/2017   Right tennis elbow 04/07/2017   Prolapsed internal hemorrhoids, grade 3 06/03/2016   Hx of adenomatous colonic polyps 05/27/2016    PCP: Elsie JONETTA Loreli Mickey, MD  REFERRING PROVIDER: Bertrum LELON Gaskins, [name]  REFERRING DIAG: [phone] (ICD-10-CM) - Trigger point of left shoulder region  THERAPY DIAG:  Chronic left shoulder pain - Plan: PT plan of care cert/re-cert  Other abnormalities  of gait and mobility - Plan: PT plan of care cert/re-cert  Abnormal posture - Plan: PT plan of care cert/re-cert  Muscle weakness (generalized) - Plan: PT plan of care cert/re-cert  Rationale for Evaluation and Treatment: Rehabilitation  ONSET DATE: several years of soreness  SUBJECTIVE:                                                                                                                                                                                      SUBJECTIVE STATEMENT: When I go to the gym, it feels a lot weaker than the other one. Hand dominance: Right  PERTINENT HISTORY: Patient endorses  referral for Lt UT/shoulder trigger point. Patient has received a steroid injection in the past from Dr. Vernetta which did not assist with pain. Patient has undergone Lt rotator cuff repair (2022-2023) and has also endured a fall off a deck where he landed on Lt side which fractured several ribs. No other surgeries or trauma noted to shoulder/neck or surrounding joints. Patient notes biggest trigger for achy pain is walking for prolonged periods of time and exercise.  See PMH for in depth comorbidities   PAIN:  Are you having pain? Yes: NPRS scale: 0/10 at rest, 5-7/10 at worst  Pain location: Lt UT  Pain description: severe ache  Aggravating factors: walking, exercise  Relieving factors: stretch (temporary)  PRECAUTIONS: None   RED FLAGS: None   WEIGHT BEARING RESTRICTIONS: No  FALLS:  Has patient fallen in last 6 months? No  LIVING ENVIRONMENT: Lives with: lives with their spouse Lives in: House/apartment Stairs: Yes: Internal: 16 steps; on right going up and External: 8 steps; on right going up Has following equipment at home: None  OCCUPATION: Retired; used to work for [employer]  PLOF: Independent  PATIENT GOALS: to relieve the pain  NEXT MD VISIT: not currently scheduled   OBJECTIVE:  Note: Objective measures were completed at Evaluation unless otherwise noted.  DIAGNOSTIC FINDINGS:  3 views of the left shoulder show well located shoulder with no acute  findings.   PATIENT SURVEYS :  PSFS: THE PATIENT SPECIFIC FUNCTIONAL SCALE  Place score of 0-10 (0 = unable to perform activity and 10 = able to perform activity at the same level as before injury or problem)  Activity Date: 01/20/2024    Walking for prolonged period of time  6    2. Exercise  4    3.     4.      Total Score 5      Total Score = Sum of activity scores/number of activities  Minimally Detectable Change: 3 points (for single activity); 2 points (for  average score)  [facility] (nd). The Patient Specific Functional Scale . Retrieved from [email or web address]   COGNITION: Overall cognitive status: Within functional limits for tasks assessed     SENSATION: Light touch: WFL  POSTURE: Rounded shoulders, increased thoracic kyphosis, forward head   UPPER EXTREMITY ROM:    ROM Right Eval 01/20/2024 Left Eval 01/20/2024  Shoulder flexion [facility]  Shoulder extension    Shoulder abduction WFL  WFL, but painful   Shoulder adduction    Shoulder internal rotation    Shoulder external rotation    Elbow flexion    Elbow extension    Wrist flexion    Wrist extension    Wrist ulnar deviation    Wrist radial deviation    Wrist pronation    Wrist supination    (Blank rows = not tested)  UPPER EXTREMITY MMT:  MMT Right Eval 01/20/2024 Left Eval 01/20/2024  Shoulder flexion 5/5 (seated) 4+/5 (seated)  Shoulder extension    Shoulder abduction 5/5 (seated) 4/5, painful (seated)  Shoulder adduction    Shoulder internal rotation 5/5 (seated) 5/5 (seated)  Shoulder external rotation 5/5 (seated) 5/5 (seated)  Middle trapezius    Lower trapezius    Elbow flexion    Elbow extension    Wrist flexion    Wrist extension    Wrist ulnar deviation    Wrist radial deviation    Wrist pronation    Wrist supination    Grip strength (lbs)    (Blank rows = not tested)  Cervical ROM: 01/20/2024  Flexion: 100% Extension: 75% Rt lateral flexion: 25% Lt lateral flexion: 25% Rt rotation: 85% LT rotation: 85%    SHOULDER SPECIAL TESTS: Impingement tests: Painful arc test: positive  Rotator cuff assessment: Empty can test: negative and Full can test: positive    PALPATION:  Bilat scapulae protracted and TTP with deep pressure to UT/Levator scap/supraspinatus earlier                                                                                                                              TREATMENT DATE:   01/20/2024 TherEx:  HEP handout provided with patient performing one set of each activity for appropriate form. Verbal and tactile cues required.  PT discussed dry needling with patient and importance of performing HEP/physical activity with dry needling in order to receive max results. PT discussed interaction between trauma/injury/surgery, guarding of the shoulder, overuse of contralateral shoulder, and overuse of musculature due to guarding.   Self-Care:  POC and what to expect with recovery  PATIENT EDUCATION: Education details: HEP, POC, dry needling, musculoskeletal interactions Person educated: Patient Education method: Explanation, Demonstration, Tactile cues, Verbal cues, and Handouts Education comprehension: verbalized understanding, returned demonstration, verbal cues required, and tactile cues required  HOME EXERCISE PROGRAM: Access Code: [email or web address] URL: [email or web address] Date: 01/20/2024 Prepared by: Susannah Daring  Exercises - Seated Upper Trapezius Stretch  - 1 x daily - 7 x weekly -  2 sets - 30s hold - Seated Levator Scapulae Stretch  - 1 x daily - 7 x weekly - 2 sets - 30s hold - Cervical Extension AROM with Strap  - 1 x daily - 7 x weekly - 2 sets - 10 reps - 5s hold - Seated Assisted Cervical Rotation with Towel  - 1 x daily - 7 x weekly - 2 sets - 10 reps - 5s hold - Seated Cervical Retraction  - 1 x daily - 7 x weekly - 2 sets - 10 reps - 2s hold  ASSESSMENT:  CLINICAL IMPRESSION: Patient is a 78 y.o. M who was seen today for physical therapy evaluation and treatment for Lt shoulder pain with pain with palpation, functional mobility deficits, coordination deficits, slight general Lt shoulder weakness, and decreased muscular endurance. Patient is most limited by decreased muscular endurance and pain. Patient will continue to benefit from skilled PT to improve upon deficits noted above.    OBJECTIVE IMPAIRMENTS: decreased activity tolerance,  decreased coordination, decreased endurance, decreased mobility, difficulty walking, decreased ROM, decreased strength, increased muscle spasms, impaired UE functional use, improper body mechanics, postural dysfunction, and pain.   ACTIVITY LIMITATIONS: carrying, lifting, and reach over head  PARTICIPATION LIMITATIONS: community activity and exercise  PERSONAL FACTORS: Past/current experiences, Time since onset of injury/illness/exacerbation, and 3+ comorbidities: arthritis, glaucoma, morton neuroma, Rt bundle branch block, carotic artery stenosis, coronary atherosclerosis, premature ventricular contractions are also affecting patient's functional outcome.   REHAB POTENTIAL: Good  CLINICAL DECISION MAKING: Stable/uncomplicated  EVALUATION COMPLEXITY: Low  GOALS: Goals reviewed with patient? Yes  SHORT TERM GOALS: Target date: 02/10/2024  Patient will show compliance with initial HEP. Baseline: Goal status: INITIAL  2.  Patient will report pain levels no greater than 3/10 in order to show improvement in overall quality of life. Baseline:  Goal status: INITIAL   LONG TERM GOALS: Target date: 03/02/2024  Patient will be independent with final HEP in order to maintain and progress upon functional gains made within PT.  Baseline:  Goal status: INITIAL  2.  Patient will report pain levels no greater than 1/10 in order to show improvement in overall quality of life. Baseline:  Goal status: INITIAL  3.  Patient will increase PSFS to at least 7 in order to show a significant improvement in subjective disability rating. Baseline:  Goal status: INITIAL  4.  Patient will increase cervical lateral flexion ROM to at least 50% in order to improve functional mobility. Baseline:  Goal status: INITIAL  5.  Patient will increase cervical extension ROM to at least 100% in order to improve functional mobility. Baseline:  Goal status: INITIAL  6.  Patient will increase Lt shoulder  abduction MMT to at least 4+/5 in order to improve biomechanics with functional mobility. Baseline:  Goal status: INITIAL  PLAN: PT FREQUENCY: 1x/week  PT DURATION: 6 weeks  PLANNED INTERVENTIONS: 97164- PT Re-evaluation, 97750- Physical Performance Testing, 97110-Therapeutic exercises, 97530- Therapeutic activity, [id number]- Neuromuscular re-education, 97535- Self Care, [id number]- Manual therapy, [id number]- Gait training, [phone]- Orthotic Initial, [phone]- Orthotic/Prosthetic subsequent, [phone]- Canalith repositioning, [id number]- Aquatic Therapy, [phone]- Electrical stimulation (unattended), [phone]- Electrical stimulation (manual), [id number]- Vasopneumatic device, [id number]- Ultrasound, [id number]- Traction (mechanical), [id number]- Ionotophoresis 4mg /ml Dexamethasone , [id number] (1-2 muscles), [id number] (3+ muscles)- Dry Needling, Patient/Family education, Balance training, Stair training, Taping, Joint mobilization, Joint manipulation, Spinal manipulation, Spinal mobilization, Scar mobilization, Compression bandaging, Vestibular training, DME instructions, Cryotherapy, and Moist heat  PLAN FOR NEXT SESSION: review HEP, dry needling, postural strengthening, shoulder strengthening, cervical  mobility   Susannah Daring, PT, DPT 01/20/24 3:45 PM

## 2024-01-19 ENCOUNTER — Ambulatory Visit: Admitting: Physician Assistant

## 2024-01-19 ENCOUNTER — Encounter: Payer: Self-pay | Admitting: Physician Assistant

## 2024-01-19 DIAGNOSIS — M1711 Unilateral primary osteoarthritis, right knee: Secondary | ICD-10-CM | POA: Diagnosis not present

## 2024-01-19 MED ORDER — HYALURONAN 30 MG/2ML IX SOSY
30.0000 mg | PREFILLED_SYRINGE | INTRA_ARTICULAR | Status: AC | PRN
Start: 2024-01-19 — End: 2024-01-19
  Administered 2024-01-19: 30 mg via INTRA_ARTICULAR

## 2024-01-19 NOTE — Progress Notes (Signed)
   Procedure Note  Patient: ODILON CASS             Date of Birth: 03-06-1946           MRN: [id number]             Visit Date: 01/19/2024 HPI: Mr. Cecchi comes in today for his third Orthovisc disc injection right knee.  States he has had no real relief but no adverse effects to the injections.  Does feel the knee feels fuller.  Otherwise no complaint.  Physical exam: Right knee no abnormal warmth erythema.  He has full extension and flexion to 115 degrees.  Procedures: Visit Diagnoses:  1. Unilateral primary osteoarthritis, right knee     Large Joint Inj: R knee on 01/19/2024 1:45 PM Indications: pain Details: 22 G 1.5 in needle, anterolateral approach  Arthrogram: No  Medications: 30 mg Hyaluronan 30 MG/2ML Outcome: tolerated well, no immediate complications Procedure, treatment alternatives, risks and benefits explained, specific risks discussed. Consent was given by the patient. Immediately prior to procedure a time out was called to verify the correct patient, procedure, equipment, support staff and site/side marked as required. Patient was prepped and draped in the usual sterile fashion.    Plan: He will follow-up with us  as needed knows to wait at least 6 months between viscosupplementation injections.  Questions were encouraged and answered.

## 2024-01-20 ENCOUNTER — Ambulatory Visit

## 2024-01-20 ENCOUNTER — Encounter: Payer: Self-pay | Admitting: Internal Medicine

## 2024-01-20 DIAGNOSIS — M6281 Muscle weakness (generalized): Secondary | ICD-10-CM

## 2024-01-20 DIAGNOSIS — G8929 Other chronic pain: Secondary | ICD-10-CM | POA: Diagnosis not present

## 2024-01-20 DIAGNOSIS — M25512 Pain in left shoulder: Secondary | ICD-10-CM | POA: Diagnosis not present

## 2024-01-20 DIAGNOSIS — R2689 Other abnormalities of gait and mobility: Secondary | ICD-10-CM | POA: Diagnosis not present

## 2024-01-20 DIAGNOSIS — R293 Abnormal posture: Secondary | ICD-10-CM

## 2024-01-23 ENCOUNTER — Other Ambulatory Visit: Payer: Self-pay

## 2024-01-23 ENCOUNTER — Other Ambulatory Visit: Payer: Self-pay | Admitting: Medical Genetics

## 2024-01-23 MED ORDER — ATORVASTATIN CALCIUM 40 MG PO TABS: 40.0000 mg | ORAL_TABLET | Freq: Every day | ORAL | 3 refills | Status: AC

## 2024-01-24 DIAGNOSIS — Z23 Encounter for immunization: Secondary | ICD-10-CM | POA: Diagnosis not present

## 2024-01-25 ENCOUNTER — Encounter: Payer: Self-pay | Admitting: Internal Medicine

## 2024-01-25 ENCOUNTER — Ambulatory Visit (AMBULATORY_SURGERY_CENTER)

## 2024-01-25 VITALS — Ht 74.0 in | Wt 185.0 lb

## 2024-01-25 DIAGNOSIS — Z860100 Personal history of colon polyps, unspecified: Secondary | ICD-10-CM

## 2024-01-25 NOTE — Progress Notes (Signed)
 No egg or soy allergy known to patient  No issues known to pt with past sedation with any surgeries or procedures Patient denies ever being told they had issues or difficulty with intubation  No FH of Malignant Hyperthermia Pt is not on diet pills Pt is not on  home 02  Pt is not on blood thinners  Pt denies issues with constipation  No A fib or A flutter. Hx bradycarida and PVCS Have any cardiac testing pending-- no  LOA: independent  Prep: suprep   Patient's chart reviewed by Norleen Schillings CNRA prior to previsit and patient appropriate for the [facility].  Previsit completed and red dot placed by patient's name on their procedure day (on provider's schedule).     PV completed with patient. Prep instructions sent via mychart and home address.

## 2024-01-27 ENCOUNTER — Telehealth: Payer: Self-pay | Admitting: Internal Medicine

## 2024-01-27 NOTE — Telephone Encounter (Signed)
 Returned patient's call.  Patient reports that he has chronic intermittent chest pain. He explains that his cardiac workup has been historically negative with only mild stenosis on CTA but his chest pain on exertion has been worsening over the last 1-2 months and seems to take longer to recover from. He does check his HR and BP at home but is away from home right now and cannot access his readings. Offered appt with EMERSON Pavy [name] on 10/6, patient accepts.

## 2024-01-27 NOTE — Telephone Encounter (Signed)
 Returned patient's call, no answer. Left voice mail asking patient to call our office back to discuss an appointment.

## 2024-01-27 NOTE — Telephone Encounter (Signed)
 Pt returning call. Please advise.

## 2024-01-27 NOTE — Telephone Encounter (Signed)
 Pt c/o of Chest Pain: STAT if active (IN THIS MOMENT) CP, including tightness, pressure, jaw pain, shoulder/upper arm/back pain, SOB, nausea, and vomiting.  1. Are you having CP right now (tightness, pressure, or discomfort)? No, primarily when exercise walking there's chest pressure.  2. Are you experiencing any other symptoms (ex. SOB, nausea, vomiting, sweating)? No, but there's lightheadedness when exercise walking.   3. How long have you been experiencing CP? For several years but he's been talking to Dr. Inocencio about it for a while, it's changed presentation now so the chest pressure is lasting longer for about a month or two.   4. Is your CP continuous or coming and going? Coming and going   5. Have you taken Nitroglycerin ? No   6. If CP returns before callback, please consider calling 911. ?

## 2024-01-29 NOTE — Progress Notes (Unsigned)
 Cardiology Office Note:  .   Date:  01/30/2024  ID:  Andrew Payne, DOB May 18, 1945, MRN [id number] PCP: Loreli Elsie [name], MD  [facility] Providers Cardiologist:  Lurena MARLA Red, MD Electrophysiologist:  Soyla Gladis Norton, MD    History of Present Illness: .   Andrew Payne is a 78 y.o. male  with history of CAD mild on Coronary CTA 2019 , SSS, HLD, CKD stage 2, PVC's previously on Flecainide  but stopped 2024 with some DOE and reduction in exertional capacity, RBBB, resting bradycardia. .  Patient saw Dr. Golden 10/2023 with chest pain walking up hill with lightheadedness and body fatigue. It was recommended we monitor his symptoms.  He called in with recurrent exertional chest pain that has worsened over the past 2 months. He walks 4-5 miles and is feeling chest pressure sometimes the whole time, sometimes for the first 2 miles. He sometimes gets lightheaded and it eventually eases. It went away for a month or two when he was drinking water. Described chest pressure, some shortness of breath. Occasionally stops if he gets lightheaded. Has a fit bit and HR at rest is always in the 30's.     ROS:    Studies Reviewed: SABRA         Prior CV Studies:    11/08/22: stress myoview    Findings are consistent with no ischemia. The study is low risk.   No ST deviation was noted.   LV perfusion is abnormal. Defect 1: There is a small defect with mild reduction in uptake present in the basal inferior location(s) that is fixed. There is normal wall motion in the defect area. Consistent with artifact caused by subdiaphragmatic activity.   Left ventricular function is normal. Nuclear stress EF: 50%. The left ventricular ejection fraction is mildly decreased (45-54%). End diastolic cavity size is mildly enlarged. End systolic cavity size is normal.   Prior study available for comparison from 04/15/2005.   Small size, mild intensity fixed basal inferior perfusion defect, likely artifact. No  reversible ischemia. LVEF 50% with normal wall motion. This is a low risk study. Compared to a prior study in 2006, there are no significant changes.   HR this exam rest 40 > peak 60   Coronary CTA 11/2021 FINDINGS: Image quality: Average.   Noise artifact is: Moderate. (signal-to-noise).   Coronary Arteries:  Normal coronary origin.  Right dominance.   Left main: The left main is a large caliber vessel with a normal take off from the left coronary cusp that bifurcates to form a left anterior descending artery and a left circumflex artery. There is no plaque or stenosis.   Left anterior descending artery: The LAD has mild (25-49) calcfied stenosis followed by mild (25-49) mixed plaque stenosis. The LAD gives off 1 diagonal branch with mild (25-49) calcified stenosis.   Left circumflex artery: The LCX is non-dominant and patent with minimal (0-24) plaque. The LCX gives off large OM1 and small OM2. There is mild (25-49) plaque in the proximal and distal OM1.   Right coronary artery: The RCA is dominant with normal take off from the right coronary cusp. There is minimal (0-24) calcified plaque. The RCA terminates as a PDA and right posterolateral branch without evidence of plaque or stenosis.   Right Atrium: Right atrial size is within normal limits.   Right Ventricle: The right ventricular cavity is within normal limits.   Left Atrium: Left atrial size is normal in size with no left  atrial appendage filling defect.   Left Ventricle: The ventricular cavity size is within normal limits. There are no stigmata of prior infarction. There is no abnormal filling defect.   Pulmonary arteries: Normal in size without proximal filling defect.   Pulmonary veins: Normal pulmonary venous drainage.   Pericardium: Normal thickness with no significant effusion or calcium  present.   Cardiac valves: The aortic valve is trileaflet without significant calcification. The mitral valve is  normal structure without significant calcification.   Aorta: Normal caliber with no aortic atherosclerosis.   Extra-cardiac findings: See attached radiology report for non-cardiac structures.   IMPRESSION: 1. Coronary calcium  score of 154. This was 67 percentile for age-, sex, and race-matched controls.   2. Normal coronary origin with right dominance.   3. Mild stenoses in the LAD, D12, and OM1.   4. Aortic atherosclerosis.  RECOMMENDATIONS: CAD-RADS 2: Mild non-obstructive CAD (25-49%). Consider non-atherosclerotic causes of chest pain. Consider preventive therapy and risk factor modification.     11/12/21: TTE 1. Left ventricular ejection fraction, by estimation, is 60 to 65%. The  left ventricle has normal function. The left ventricle has no regional  wall motion abnormalities. Left ventricular diastolic parameters were  normal.   2. Right ventricular systolic function is normal. The right ventricular  size is normal. There is normal pulmonary artery systolic pressure. The  estimated right ventricular systolic pressure is 31.5 mmHg.   3. Left atrial size was mildly dilated.   4. The mitral valve is abnormal. Mild mitral valve regurgitation.   5. The aortic valve is tricuspid. Aortic valve regurgitation is not  visualized. Aortic valve sclerosis is present, with no evidence of aortic  valve stenosis.   6. Aortic dilatation noted. There is borderline dilatation of the  ascending aorta, measuring 38 mm.   7. The inferior vena cava is normal in size with greater than 50%  respiratory variability, suggesting right atrial pressure of 3 mmHg.     Risk Assessment/Calculations:             Physical Exam:   VS:  BP 108/60   Pulse (!) 40   Ht 6' 2 (1.88 m)   Wt 182 lb 12.8 oz (82.9 kg)   SpO2 98%   BMI 23.47 kg/m    Orhtostatics: No data found. Wt Readings from Last 3 Encounters:  01/30/24 182 lb 12.8 oz (82.9 kg)  01/25/24 185 lb (83.9 kg)  11/22/23 184 lb (83.5  kg)    GEN: Well nourished, well developed in no acute distress NECK: No JVD; bilateral carotid bruits CARDIAC:  RRR, freq skipping no murmurs, rubs, gallops RESPIRATORY:  Clear to auscultation without rales, wheezing or rhonchi  ABDOMEN: Soft, non-tender, non-distended EXTREMITIES:  No edema; No deformity   ASSESSMENT AND PLAN: .    Chest pain/tightness with walking flat or uphill. Low risk myoview  2024. ? Microvascular angina. BP running on low side but suspect it's from PVC's he's having. Will try to add low dose amlodipine 2.5 mg daily to see if it helps. Recheck 2 week Zio for PVC load.  CAD Coronary calcium  score 154, mild stenosis LAD, D12, OM1 11/2021, low risk myoview   SSS/bradycardia/bifasicular block/PVC's previously on flecainide , stopped 2024, followed by EPS. Recheck Zio. Fit bit shows HR's 30's at rest and 129 with exercise.  HLD LDL 58 10/2023  CKD stage 2 followed by PCP        Dispo: f/u as scheduled  Signed, [name], [name]

## 2024-01-30 ENCOUNTER — Encounter: Payer: Self-pay | Admitting: Physician Assistant

## 2024-01-30 ENCOUNTER — Ambulatory Visit: Attending: Physician Assistant | Admitting: Physician Assistant

## 2024-01-30 ENCOUNTER — Ambulatory Visit (INDEPENDENT_AMBULATORY_CARE_PROVIDER_SITE_OTHER)

## 2024-01-30 VITALS — BP 108/60 | HR 40 | Ht 74.0 in | Wt 182.8 lb

## 2024-01-30 DIAGNOSIS — I452 Bifascicular block: Secondary | ICD-10-CM

## 2024-01-30 DIAGNOSIS — I2510 Atherosclerotic heart disease of native coronary artery without angina pectoris: Secondary | ICD-10-CM

## 2024-01-30 DIAGNOSIS — E785 Hyperlipidemia, unspecified: Secondary | ICD-10-CM

## 2024-01-30 DIAGNOSIS — I495 Sick sinus syndrome: Secondary | ICD-10-CM | POA: Diagnosis not present

## 2024-01-30 DIAGNOSIS — R079 Chest pain, unspecified: Secondary | ICD-10-CM | POA: Diagnosis not present

## 2024-01-30 DIAGNOSIS — N182 Chronic kidney disease, stage 2 (mild): Secondary | ICD-10-CM | POA: Diagnosis not present

## 2024-01-30 DIAGNOSIS — R001 Bradycardia, unspecified: Secondary | ICD-10-CM

## 2024-01-30 DIAGNOSIS — I493 Ventricular premature depolarization: Secondary | ICD-10-CM | POA: Diagnosis not present

## 2024-01-30 MED ORDER — AMLODIPINE BESYLATE 5 MG PO TABS: 2.5000 mg | ORAL_TABLET | Freq: Every day | ORAL | 3 refills | Status: DC

## 2024-01-30 NOTE — Progress Notes (Unsigned)
 ZIO serial # [id number] from office inventory applied to patient.  Dr. Inocencio to read.

## 2024-01-30 NOTE — Patient Instructions (Signed)
 Medication Instructions:  Start Amlodipine 5 mg- take 1/2 tablet daily  *If you need a refill on your cardiac medications before your next appointment, please call your pharmacy*  Lab Work: NONE ordered at this time of appointment   Testing/Procedures: Andrew Payne- Long Term Monitor Instructions  Your physician has requested you wear a ZIO patch monitor for 14 days.  This is a single patch monitor. [employer] supplies one patch monitor per enrollment. Additional stickers are not available. Please do not apply patch if you will be having a Nuclear Stress Test,  Echocardiogram, Cardiac CT, MRI, or Chest Xray during the period you would be wearing the  monitor. The patch cannot be worn during these tests. You cannot remove and re-apply the  ZIO XT patch monitor.  Your ZIO patch monitor will be mailed 3 day USPS to your address on file. It may take 3-5 days  to receive your monitor after you have been enrolled.  Once you have received your monitor, please review the enclosed instructions. Your monitor  has already been registered assigning a specific monitor serial # to you.  Billing and Patient Assistance Program Information  We have supplied [employer] with any of your insurance information on file for billing purposes. [employer] offers a sliding scale Patient Assistance Program for patients that do not have  insurance, or whose insurance does not completely cover the cost of the ZIO monitor.  You must apply for the Patient Assistance Program to qualify for this discounted rate.  To apply, please call [employer] at [phone], select option 4, select option 2, ask to apply for  Patient Assistance Program. Meredeth will ask your household income, and how many people  are in your household. They will quote your out-of-pocket cost based on that information.  [employer] will also be able to set up a 71-month, interest-free payment plan if needed.  Applying the monitor   Shave hair from upper left chest.   Hold abrader disc by orange tab. Rub abrader in 40 strokes over the upper left chest as  indicated in your monitor instructions.  Clean area with 4 enclosed alcohol  pads. Let dry.  Apply patch as indicated in monitor instructions. Patch will be placed under collarbone on left  side of chest with arrow pointing upward.  Rub patch adhesive wings for 2 minutes. Remove white label marked 1. Remove the white  label marked 2. Rub patch adhesive wings for 2 additional minutes.  While looking in a mirror, press and release button in center of patch. A small green light will  flash 3-4 times. This will be your only indicator that the monitor has been turned on.  Do not shower for the first 24 hours. You may shower after the first 24 hours.  Press the button if you feel a symptom. You will hear a small click. Record Date, Time and  Symptom in the Patient Logbook.  When you are ready to remove the patch, follow instructions on the last 2 pages of Patient  Logbook. Stick patch monitor onto the last page of Patient Logbook.  Place Patient Logbook in the blue and white box. Use locking tab on box and tape box closed  securely. The blue and white box has prepaid postage on it. Please place it in the mailbox as  soon as possible. Your physician should have your test results approximately 7 days after the  monitor has been mailed back to [facility].  Call [facility] Customer Care at [phone] if you have  questions regarding  your ZIO XT patch monitor. Call them immediately if you see an orange light blinking on your  monitor.  If your monitor falls off in less than 4 days, contact our Monitor department at [phone].  If your monitor becomes loose or falls off after 4 days call [employer] at [phone] for  suggestions on securing your monitor   Follow-Up: At [facility], you and your health needs are our priority.  As part of our continuing mission to provide you with  exceptional heart care, our providers are all part of one team.  This team includes your primary Cardiologist (physician) and Advanced Practice Providers or APPs (Physician Assistants and Nurse Practitioners) who all work together to provide you with the care you need, when you need it.  Your next appointment:   6 month(s)  Provider:   Arun K Thukkani, MD    We recommend signing up for the patient portal called MyChart.  Sign up information is provided on this After Visit Summary.  MyChart is used to connect with patients for Virtual Visits (Telemedicine).  Patients are able to view lab/test results, encounter notes, upcoming appointments, etc.  Non-urgent messages can be sent to your provider as well.   To learn more about what you can do with MyChart, go to [email or web address].

## 2024-02-02 ENCOUNTER — Ambulatory Visit: Admitting: Physical Therapy

## 2024-02-02 ENCOUNTER — Encounter: Payer: Self-pay | Admitting: Physical Therapy

## 2024-02-02 DIAGNOSIS — M542 Cervicalgia: Secondary | ICD-10-CM

## 2024-02-02 DIAGNOSIS — M6281 Muscle weakness (generalized): Secondary | ICD-10-CM

## 2024-02-02 DIAGNOSIS — R293 Abnormal posture: Secondary | ICD-10-CM | POA: Diagnosis not present

## 2024-02-02 DIAGNOSIS — G8929 Other chronic pain: Secondary | ICD-10-CM

## 2024-02-02 DIAGNOSIS — M25512 Pain in left shoulder: Secondary | ICD-10-CM | POA: Diagnosis not present

## 2024-02-02 NOTE — Patient Instructions (Signed)

## 2024-02-02 NOTE — Therapy (Signed)
 OUTPATIENT PHYSICAL THERAPY TREATMENT    Patient Name: Andrew Payne MRN: [id number] DOB:11/04/1945, 78 y.o., male Today's Date: 02/02/2024  END OF SESSION:  PT End of Session - 02/02/24 0801     Visit Number 2    Number of Visits 6    Date for Recertification  03/02/24    Authorization Type [employer] $10    Progress Note Due on Visit 10    PT Start Time 0800    PT Stop Time 0831    PT Time Calculation (min) 31 min    Activity Tolerance Patient tolerated treatment well    Behavior During Therapy [facility] for tasks assessed/performed           Past Medical History:  Diagnosis Date   Bradycardia    cardiology-- dr inocencio--  work-up done included event monitor and echo (all in epic)   Carotid artery stenosis    per pt H&P dated 10-20-2018 --  <40%   Coronary atherosclerosis    Glaucoma, right eye    History of nuclear stress test 04/15/2005   (in epic)  for near syncope--- Low risk normal no ischemia, ef 57%   Hx of adenomatous colonic polyps 05/27/2016   Internal hemorrhoids    Morton neuroma, left    OA (osteoarthritis)    wrist, elbow, thumb, knees   PONV (postoperative nausea and vomiting)    PVC's (premature ventricular contractions)    RBBB (right bundle branch block)    States heart rate has been as low as 28 and asyptomatic   Past Surgical History:  Procedure Laterality Date   COLONOSCOPY  05/2016   TA   ELBOW SURGERY Left 04/2017   EXCISION MORTON'S NEUROMA Left 10/23/2018   Procedure: EXCISION MORTON'S NEUROMA LEFT FOOT;  Surgeon: Zan Factor, DPM;  Location: [facility];  Service: Podiatry;  Laterality: Left;   HEMORRHOID BANDING  2018   INGUINAL HERNIA REPAIR Left 06/14/2017   Procedure: LEFT INGUINAL HERNIA REPAIR ERAS PATHWAY;  Surgeon: Vanderbilt Ned, MD;  Location: [facility];  Service: General;  Laterality: Left;   INGUINAL HERNIA REPAIR Right 11-18-2009   dr tanda  @[facility]    INGUINAL HERNIA REPAIR Right  12/16/2022   Procedure: RIGHT HERNIA REPAIR INGUINAL WITH MESH;  Surgeon: Vanderbilt Ned, MD;  Location: [facility];  Service: General;  Laterality: Right;  TAP BLOCK/GEN   INSERTION OF MESH Left 06/14/2017   Procedure: INSERTION OF MESH;  Surgeon: Vanderbilt Ned, MD;  Location: [facility];  Service: General;  Laterality: Left;   KNEE ARTHROSCOPY W/ MENISCAL REPAIR Right 09/2015   POLYPECTOMY     TONSILLECTOMY AND ADENOIDECTOMY  child   Patient Active Problem List   Diagnosis Date Noted   Impingement syndrome of left shoulder 02/11/2022   Multiple rib fractures 01/27/2022   Pneumothorax 01/27/2022   DOE (dyspnea on exertion) 12/30/2021   Nontraumatic incomplete tear of left rotator cuff 03/13/2020   Unilateral primary osteoarthritis, right knee 11/01/2017   Chest pain 08/07/2017   Bradycardia 08/07/2017   History of right tennis elbow 05/09/2017   Right tennis elbow 04/07/2017   Prolapsed internal hemorrhoids, grade 3 06/03/2016   Hx of adenomatous colonic polyps 05/27/2016    PCP: Elsie JONETTA Loreli Mickey, MD  REFERRING PROVIDER: Bertrum LELON Gaskins, [name]  REFERRING DIAG: [phone] (ICD-10-CM) - Trigger point of left shoulder region  THERAPY DIAG:  Chronic left shoulder pain  Abnormal posture  Muscle weakness (generalized)  Cervicalgia  Rationale for Evaluation and Treatment: Rehabilitation  ONSET DATE: several years of soreness  SUBJECTIVE:                                                                                                                                                                                      SUBJECTIVE STATEMENT: Symptoms typically start after about a mile of walking, no pain currently.  Hand dominance: Right  PERTINENT HISTORY: Patient endorses referral for Lt UT/shoulder trigger point. Patient has received a steroid injection in the past from Dr. Vernetta which did not assist with pain. Patient has undergone Lt  rotator cuff repair (2022-2023) and has also endured a fall off a deck where he landed on Lt side which fractured several ribs. No other surgeries or trauma noted to shoulder/neck or surrounding joints. Patient notes biggest trigger for achy pain is walking for prolonged periods of time and exercise.  See PMH for in depth comorbidities   PAIN:  Are you having pain? Yes: NPRS scale: 0/10 at rest, 5-7/10 at worst  Pain location: Lt UT  Pain description: severe ache  Aggravating factors: walking, exercise  Relieving factors: stretch (temporary)  PRECAUTIONS: None   RED FLAGS: None   WEIGHT BEARING RESTRICTIONS: No  FALLS:  Has patient fallen in last 6 months? No  LIVING ENVIRONMENT: Lives with: lives with their spouse Lives in: House/apartment Stairs: Yes: Internal: 16 steps; on right going up and External: 8 steps; on right going up Has following equipment at home: None  OCCUPATION: Retired; used to work for [employer]  PLOF: Independent  PATIENT GOALS: to relieve the pain  NEXT MD VISIT: not currently scheduled   OBJECTIVE:  Note: Objective measures were completed at Evaluation unless otherwise noted.  DIAGNOSTIC FINDINGS:  3 views of the left shoulder show well located shoulder with no acute  findings.   PATIENT SURVEYS :  PSFS: THE PATIENT SPECIFIC FUNCTIONAL SCALE  Place score of 0-10 (0 = unable to perform activity and 10 = able to perform activity at the same level as before injury or problem)  Activity Date: 01/20/2024    Walking for prolonged period of time  6    2. Exercise  4    3.     4.      Total Score 5      Total Score = Sum of activity scores/number of activities  Minimally Detectable Change: 3 points (for single activity); 2 points (for average score)  [facility] (nd). The Patient Specific Functional Scale . Retrieved from [email or web address]   COGNITION: Overall cognitive  status: Within functional limits for tasks assessed     SENSATION:  Light touch: WFL  POSTURE: Rounded shoulders, increased thoracic kyphosis, forward head   UPPER EXTREMITY ROM:    ROM Right Eval 01/20/2024 Left Eval 01/20/2024  Shoulder flexion [facility]  Shoulder extension    Shoulder abduction WFL  WFL, but painful   Shoulder adduction    Shoulder internal rotation    Shoulder external rotation    Elbow flexion    Elbow extension    Wrist flexion    Wrist extension    Wrist ulnar deviation    Wrist radial deviation    Wrist pronation    Wrist supination    (Blank rows = not tested)  UPPER EXTREMITY MMT:  MMT Right Eval 01/20/2024 Left Eval 01/20/2024  Shoulder flexion 5/5 (seated) 4+/5 (seated)  Shoulder extension    Shoulder abduction 5/5 (seated) 4/5, painful (seated)  Shoulder adduction    Shoulder internal rotation 5/5 (seated) 5/5 (seated)  Shoulder external rotation 5/5 (seated) 5/5 (seated)  (Blank rows = not tested)  Cervical ROM: 01/20/2024  Flexion: 100% Extension: 75% Rt lateral flexion: 25% Lt lateral flexion: 25% Rt rotation: 85% LT rotation: 85%    SHOULDER SPECIAL TESTS: Impingement tests: Painful arc test: positive  Rotator cuff assessment: Empty can test: negative and Full can test: positive    PALPATION:  Bilat scapulae protracted and TTP with deep pressure to UT/Levator scap/supraspinatus earlier                                                                                                                              TREATMENT DATE:  02/02/24 Seated upper trap stretch 3x20 sec to Rt Seated cervical rotation stretch 5x10 sec bil Seated scapular retraction x 10 reps; 5 sec hold Discussed gym exercises and recommended adding shoulder flexion and abduction with light weights into rotation as well; pt verbalized understanding. Educated on safe weight progressions  STM with compression to Lt upper trap and infraspinatus, skilled  palpation and monitoring of soft tissue during DN  Trigger Point Dry Needling  Initial Treatment: Pt instructed on Dry Needling rational, procedures, and possible side effects. Pt instructed to expect mild to moderate muscle soreness later in the day and/or into the next day.  Pt instructed in methods to reduce muscle soreness. Pt instructed to continue prescribed HEP. Patient was educated on signs and symptoms of infection and other risk factors and advised to seek medical attention should they occur.  Patient verbalized understanding of these instructions and education.   Patient Verbal Consent Given: Yes Education Handout Provided: Yes Muscles Treated: Lt upper trap; Lt infraspinatus Electrical Stimulation Performed: No Treatment Response/Outcome: twitch responses noted    01/20/2024 TherEx:  HEP handout provided with patient performing one set of each activity for appropriate form. Verbal and tactile cues required.  PT discussed dry needling with patient and importance of performing HEP/physical activity with dry needling in order to receive max results. PT discussed interaction between trauma/injury/surgery, guarding of the shoulder, overuse  of contralateral shoulder, and overuse of musculature due to guarding.   Self-Care:  POC and what to expect with recovery  PATIENT EDUCATION: Education details: HEP, POC, dry needling, musculoskeletal interactions Person educated: Patient Education method: Explanation, Demonstration, Tactile cues, Verbal cues, and Handouts Education comprehension: verbalized understanding, returned demonstration, verbal cues required, and tactile cues required  HOME EXERCISE PROGRAM: Access Code: [email or web address] URL: [email or web address] Date: 01/20/2024 Prepared by: Susannah Daring  Exercises - Seated Upper Trapezius Stretch  - 1 x daily - 7 x weekly - 2 sets - 30s hold - Seated Levator Scapulae Stretch  - 1 x daily - 7 x weekly - 2 sets - 30s  hold - Cervical Extension AROM with Strap  - 1 x daily - 7 x weekly - 2 sets - 10 reps - 5s hold - Seated Assisted Cervical Rotation with Towel  - 1 x daily - 7 x weekly - 2 sets - 10 reps - 5s hold - Seated Cervical Retraction  - 1 x daily - 7 x weekly - 2 sets - 10 reps - 2s hold  ASSESSMENT:  CLINICAL IMPRESSION: Pt tolerated session well today with initial trial of dry needling today to see if this helps with symptoms.  Will continue to benefit from PT to maximize function.    OBJECTIVE IMPAIRMENTS: decreased activity tolerance, decreased coordination, decreased endurance, decreased mobility, difficulty walking, decreased ROM, decreased strength, increased muscle spasms, impaired UE functional use, improper body mechanics, postural dysfunction, and pain.   ACTIVITY LIMITATIONS: carrying, lifting, and reach over head  PARTICIPATION LIMITATIONS: community activity and exercise  PERSONAL FACTORS: Past/current experiences, Time since onset of injury/illness/exacerbation, and 3+ comorbidities: arthritis, glaucoma, morton neuroma, Rt bundle branch block, carotic artery stenosis, coronary atherosclerosis, premature ventricular contractions are also affecting patient's functional outcome.   REHAB POTENTIAL: Good  CLINICAL DECISION MAKING: Stable/uncomplicated  EVALUATION COMPLEXITY: Low  GOALS: Goals reviewed with patient? Yes  SHORT TERM GOALS: Target date: 02/10/2024  Patient will show compliance with initial HEP. Baseline: Goal status: MET 02/02/24  2.  Patient will report pain levels no greater than 3/10 in order to show improvement in overall quality of life. Baseline:  Goal status: INITIAL   LONG TERM GOALS: Target date: 03/02/2024  Patient will be independent with final HEP in order to maintain and progress upon functional gains made within PT.  Baseline:  Goal status: INITIAL  2.  Patient will report pain levels no greater than 1/10 in order to show improvement in  overall quality of life. Baseline:  Goal status: INITIAL  3.  Patient will increase PSFS to at least 7 in order to show a significant improvement in subjective disability rating. Baseline:  Goal status: INITIAL  4.  Patient will increase cervical lateral flexion ROM to at least 50% in order to improve functional mobility. Baseline:  Goal status: INITIAL  5.  Patient will increase cervical extension ROM to at least 100% in order to improve functional mobility. Baseline:  Goal status: INITIAL  6.  Patient will increase Lt shoulder abduction MMT to at least 4+/5 in order to improve biomechanics with functional mobility. Baseline:  Goal status: INITIAL  PLAN: PT FREQUENCY: 1x/week  PT DURATION: 6 weeks  PLANNED INTERVENTIONS: [id number]- PT Re-evaluation, 97750- Physical Performance Testing, 97110-Therapeutic exercises, 97530- Therapeutic activity, [id number]- Neuromuscular re-education, 97535- Self Care, 02859- Manual therapy, [id number]- Gait training, [id number]- Orthotic Initial, [id number]- Orthotic/Prosthetic subsequent, [phone]- Canalith repositioning, [id number]- Aquatic Therapy, H9716- Electrical stimulation (unattended), [id number]- Electrical stimulation (manual), [id number]- Vasopneumatic device,  [id number]- Ultrasound, 02987- Traction (mechanical), [id number]- Ionotophoresis 4mg /ml Dexamethasone , 79439 (1-2 muscles), 20561 (3+ muscles)- Dry Needling, Patient/Family education, Balance training, Stair training, Taping, Joint mobilization, Joint manipulation, Spinal manipulation, Spinal mobilization, Scar mobilization, Compression bandaging, Vestibular training, DME instructions, Cryotherapy, and Moist heat  PLAN FOR NEXT SESSION: assess response to dry needling,  review HEP PRN,  postural strengthening, shoulder strengthening, cervical mobility   Corean JULIANNA Ku, PT, DPT 02/02/24 8:40 AM

## 2024-02-06 ENCOUNTER — Ambulatory Visit: Admitting: Physical Therapy

## 2024-02-06 ENCOUNTER — Encounter: Payer: Self-pay | Admitting: Physical Therapy

## 2024-02-06 DIAGNOSIS — R293 Abnormal posture: Secondary | ICD-10-CM

## 2024-02-06 DIAGNOSIS — M6281 Muscle weakness (generalized): Secondary | ICD-10-CM | POA: Diagnosis not present

## 2024-02-06 DIAGNOSIS — G8929 Other chronic pain: Secondary | ICD-10-CM

## 2024-02-06 DIAGNOSIS — M25512 Pain in left shoulder: Secondary | ICD-10-CM

## 2024-02-06 DIAGNOSIS — M542 Cervicalgia: Secondary | ICD-10-CM

## 2024-02-06 NOTE — Therapy (Signed)
 OUTPATIENT PHYSICAL THERAPY TREATMENT    Patient Name: Andrew Payne MRN: [id number] DOB:1945-12-16, 78 y.o., male Today's Date: 02/06/2024  END OF SESSION:  PT End of Session - 02/06/24 0846     Visit Number 3    Number of Visits 6    Date for Recertification  03/02/24    Authorization Type [employer] $10    Progress Note Due on Visit 10    PT Start Time 0846    PT Stop Time 0911    PT Time Calculation (min) 25 min    Activity Tolerance Patient tolerated treatment well    Behavior During Therapy [name] for tasks assessed/performed            Past Medical History:  Diagnosis Date   Bradycardia    cardiology-- dr inocencio--  work-up done included event monitor and echo (all in epic)   Carotid artery stenosis    per pt H&P dated 10-20-2018 --  <40%   Coronary atherosclerosis    Glaucoma, right eye    History of nuclear stress test 04/15/2005   (in epic)  for near syncope--- Low risk normal no ischemia, ef 57%   Hx of adenomatous colonic polyps 05/27/2016   Internal hemorrhoids    Morton neuroma, left    OA (osteoarthritis)    wrist, elbow, thumb, knees   PONV (postoperative nausea and vomiting)    PVC's (premature ventricular contractions)    RBBB (right bundle branch block)    States heart rate has been as low as 28 and asyptomatic   Past Surgical History:  Procedure Laterality Date   COLONOSCOPY  05/2016   TA   ELBOW SURGERY Left 04/2017   EXCISION MORTON'S NEUROMA Left 10/23/2018   Procedure: EXCISION MORTON'S NEUROMA LEFT FOOT;  Surgeon: Zan Factor, DPM;  Location: [facility];  Service: Podiatry;  Laterality: Left;   HEMORRHOID BANDING  2018   INGUINAL HERNIA REPAIR Left 06/14/2017   Procedure: LEFT INGUINAL HERNIA REPAIR ERAS PATHWAY;  Surgeon: Vanderbilt Ned, MD;  Location: [facility];  Service: General;  Laterality: Left;   INGUINAL HERNIA REPAIR Right 11-18-2009   dr tanda  @[facility]    INGUINAL HERNIA REPAIR Right  12/16/2022   Procedure: RIGHT HERNIA REPAIR INGUINAL WITH MESH;  Surgeon: Vanderbilt Ned, MD;  Location: [facility];  Service: General;  Laterality: Right;  TAP BLOCK/GEN   INSERTION OF MESH Left 06/14/2017   Procedure: INSERTION OF MESH;  Surgeon: Vanderbilt Ned, MD;  Location:  SURGERY CENTER;  Service: General;  Laterality: Left;   KNEE ARTHROSCOPY W/ MENISCAL REPAIR Right 09/2015   POLYPECTOMY     TONSILLECTOMY AND ADENOIDECTOMY  child   Patient Active Problem List   Diagnosis Date Noted   Impingement syndrome of left shoulder 02/11/2022   Multiple rib fractures 01/27/2022   Pneumothorax 01/27/2022   DOE (dyspnea on exertion) 12/30/2021   Nontraumatic incomplete tear of left rotator cuff 03/13/2020   Unilateral primary osteoarthritis, right knee 11/01/2017   Chest pain 08/07/2017   Bradycardia 08/07/2017   History of right tennis elbow 05/09/2017   Right tennis elbow 04/07/2017   Prolapsed internal hemorrhoids, grade 3 06/03/2016   Hx of adenomatous colonic polyps 05/27/2016    PCP: Elsie JONETTA Loreli Mickey, MD  REFERRING PROVIDER: Bertrum LELON Gaskins, [name]  REFERRING DIAG: [phone] (ICD-10-CM) - Trigger point of left shoulder region  THERAPY DIAG:  Chronic left shoulder pain  Abnormal posture  Muscle weakness (generalized)  Cervicalgia  Rationale for Evaluation and Treatment: Rehabilitation  ONSET DATE: several years of soreness  SUBJECTIVE:                                                                                                                                                                                      SUBJECTIVE STATEMENT: Wasn't able to walk over the weekend; but did walk Friday but wasn't sure if pain was needle pain or the same feeling.    Hand dominance: Right  PERTINENT HISTORY: Patient endorses referral for Lt UT/shoulder trigger point. Patient has received a steroid injection in the past from Dr. Vernetta which did not  assist with pain. Patient has undergone Lt rotator cuff repair (2022-2023) and has also endured a fall off a deck where he landed on Lt side which fractured several ribs. No other surgeries or trauma noted to shoulder/neck or surrounding joints. Patient notes biggest trigger for achy pain is walking for prolonged periods of time and exercise.  See PMH for in depth comorbidities   PAIN:  Are you having pain? Yes: NPRS scale: 0/10 at rest, 5-7/10 at worst  Pain location: Lt UT  Pain description: severe ache  Aggravating factors: walking, exercise  Relieving factors: stretch (temporary)  PRECAUTIONS: None   RED FLAGS: None   WEIGHT BEARING RESTRICTIONS: No  FALLS:  Has patient fallen in last 6 months? No  LIVING ENVIRONMENT: Lives with: lives with their spouse Lives in: House/apartment Stairs: Yes: Internal: 16 steps; on right going up and External: 8 steps; on right going up Has following equipment at home: None  OCCUPATION: Retired; used to work for [employer]  PLOF: Independent  PATIENT GOALS: to relieve the pain  NEXT MD VISIT: not currently scheduled   OBJECTIVE:  Note: Objective measures were completed at Evaluation unless otherwise noted.  DIAGNOSTIC FINDINGS:  3 views of the left shoulder show well located shoulder with no acute  findings.   PATIENT SURVEYS :  PSFS: THE PATIENT SPECIFIC FUNCTIONAL SCALE  Place score of 0-10 (0 = unable to perform activity and 10 = able to perform activity at the same level as before injury or problem)  Activity Date: 01/20/2024    Walking for prolonged period of time  6    2. Exercise  4    Total Score 5      Total Score = Sum of activity scores/number of activities  Minimally Detectable Change: 3 points (for single activity); 2 points (for average score)  [facility] (nd). The Patient Specific Functional Scale . Retrieved from [email or web address]    COGNITION: Overall cognitive status: Within functional limits for tasks assessed  SENSATION: Light touch: WFL  POSTURE: Rounded shoulders, increased thoracic kyphosis, forward head   UPPER EXTREMITY ROM:    ROM Right Eval 01/20/2024 Left Eval 01/20/2024  Shoulder flexion [facility]  Shoulder extension    Shoulder abduction [facility]  WFL, but painful   Shoulder adduction    (Blank rows = not tested)  UPPER EXTREMITY MMT:  MMT Right Eval 01/20/2024 Left Eval 01/20/2024  Shoulder flexion 5/5 (seated) 4+/5 (seated)  Shoulder extension    Shoulder abduction 5/5 (seated) 4/5, painful (seated)  Shoulder adduction    Shoulder internal rotation 5/5 (seated) 5/5 (seated)  Shoulder external rotation 5/5 (seated) 5/5 (seated)  (Blank rows = not tested)  Cervical ROM: 01/20/2024  Flexion: 100% Extension: 75% Rt lateral flexion: 25% Lt lateral flexion: 25% Rt rotation: 85% LT rotation: 85%    SHOULDER SPECIAL TESTS: Impingement tests: Painful arc test: positive  Rotator cuff assessment: Empty can test: negative and Full can test: positive    PALPATION:  Bilat scapulae protracted and TTP with deep pressure to UT/Levator scap/supraspinatus earlier                                                                                                                              TREATMENT DATE:  02/06/24 STM with compression to Lt upper trap, skilled palpation and monitoring of soft tissue during DN; IASTM with percussive device to Lt posterior shoulder muscles  Trigger Point Dry Needling  Subsequent Treatment: Instructions provided previously at initial dry needling treatment.   Patient Verbal Consent Given: Yes Education Handout Provided: Yes Muscles Treated: Lt upper trap Electrical Stimulation Performed: No Treatment Response/Outcome: twitch responses noted   Encouraged daily gym program with stretches as well as continued walking to see how symptoms respond - educated on  post needling exercise and expectations   02/02/24 Seated upper trap stretch 3x20 sec to Rt Seated cervical rotation stretch 5x10 sec bil Seated scapular retraction x 10 reps; 5 sec hold Discussed gym exercises and recommended adding shoulder flexion and abduction with light weights into rotation as well; pt verbalized understanding. Educated on safe weight progressions     STM with compression to Lt upper trap and infraspinatus, skilled palpation and monitoring of soft tissue during DN  Trigger Point Dry Needling  Initial Treatment: Pt instructed on Dry Needling rational, procedures, and possible side effects. Pt instructed to expect mild to moderate muscle soreness later in the day and/or into the next day.  Pt instructed in methods to reduce muscle soreness. Pt instructed to continue prescribed HEP. Patient was educated on signs and symptoms of infection and other risk factors and advised to seek medical attention should they occur.  Patient verbalized understanding of these instructions and education.   Patient Verbal Consent Given: Yes Education Handout Provided: Yes Muscles Treated: Lt upper trap; Lt infraspinatus Electrical Stimulation Performed: No Treatment Response/Outcome: twitch responses noted    01/20/2024 TherEx:  HEP handout provided with patient performing  one set of each activity for appropriate form. Verbal and tactile cues required.  PT discussed dry needling with patient and importance of performing HEP/physical activity with dry needling in order to receive max results. PT discussed interaction between trauma/injury/surgery, guarding of the shoulder, overuse of contralateral shoulder, and overuse of musculature due to guarding.   Self-Care:  POC and what to expect with recovery  PATIENT EDUCATION: Education details: HEP, POC, dry needling, musculoskeletal interactions Person educated: Patient Education method: Explanation, Demonstration, Tactile cues,  Verbal cues, and Handouts Education comprehension: verbalized understanding, returned demonstration, verbal cues required, and tactile cues required  HOME EXERCISE PROGRAM: Access Code: [email or web address] URL: [email or web address] Date: 01/20/2024 Prepared by: Susannah Daring  Exercises - Seated Upper Trapezius Stretch  - 1 x daily - 7 x weekly - 2 sets - 30s hold - Seated Levator Scapulae Stretch  - 1 x daily - 7 x weekly - 2 sets - 30s hold - Cervical Extension AROM with Strap  - 1 x daily - 7 x weekly - 2 sets - 10 reps - 5s hold - Seated Assisted Cervical Rotation with Towel  - 1 x daily - 7 x weekly - 2 sets - 10 reps - 5s hold - Seated Cervical Retraction  - 1 x daily - 7 x weekly - 2 sets - 10 reps - 2s hold  ASSESSMENT:  CLINICAL IMPRESSION: Pt does report some decreased intensity and duration of symptoms when walking last week after PT. Repeated DN today to see if this continues to help with symptoms.     OBJECTIVE IMPAIRMENTS: decreased activity tolerance, decreased coordination, decreased endurance, decreased mobility, difficulty walking, decreased ROM, decreased strength, increased muscle spasms, impaired UE functional use, improper body mechanics, postural dysfunction, and pain.   ACTIVITY LIMITATIONS: carrying, lifting, and reach over head  PARTICIPATION LIMITATIONS: community activity and exercise  PERSONAL FACTORS: Past/current experiences, Time since onset of injury/illness/exacerbation, and 3+ comorbidities: arthritis, glaucoma, morton neuroma, Rt bundle branch block, carotic artery stenosis, coronary atherosclerosis, premature ventricular contractions are also affecting patient's functional outcome.   REHAB POTENTIAL: Good  CLINICAL DECISION MAKING: Stable/uncomplicated  EVALUATION COMPLEXITY: Low  GOALS: Goals reviewed with patient? Yes  SHORT TERM GOALS: Target date: 02/10/2024  Patient will show compliance with initial HEP. Baseline: Goal status: MET  02/02/24  2.  Patient will report pain levels no greater than 3/10 in order to show improvement in overall quality of life. Baseline:  Goal status: INITIAL   LONG TERM GOALS: Target date: 03/02/2024  Patient will be independent with final HEP in order to maintain and progress upon functional gains made within PT.  Baseline:  Goal status: INITIAL  2.  Patient will report pain levels no greater than 1/10 in order to show improvement in overall quality of life. Baseline:  Goal status: INITIAL  3.  Patient will increase PSFS to at least 7 in order to show a significant improvement in subjective disability rating. Baseline:  Goal status: INITIAL  4.  Patient will increase cervical lateral flexion ROM to at least 50% in order to improve functional mobility. Baseline:  Goal status: INITIAL  5.  Patient will increase cervical extension ROM to at least 100% in order to improve functional mobility. Baseline:  Goal status: INITIAL  6.  Patient will increase Lt shoulder abduction MMT to at least 4+/5 in order to improve biomechanics with functional mobility. Baseline:  Goal status: INITIAL  PLAN: PT FREQUENCY: 1x/week  PT DURATION: 6 weeks  PLANNED INTERVENTIONS: 02835- PT  Re-evaluation, 97750- Physical Performance Testing, 97110-Therapeutic exercises, 97530- Therapeutic activity, [id number]- Neuromuscular re-education, [phone]- Self Care, 02859- Manual therapy, [id number]- Gait training, [id number]- Orthotic Initial, [phone]- Orthotic/Prosthetic subsequent, [phone]- Canalith repositioning, [phone]- Aquatic Therapy, [phone]- Electrical stimulation (unattended), [phone]- Electrical stimulation (manual), [id number]- Vasopneumatic device, [id number]- Ultrasound, [id number]- Traction (mechanical), [id number]- Ionotophoresis 4mg /ml Dexamethasone , 79439 (1-2 muscles), 20561 (3+ muscles)- Dry Needling, Patient/Family education, Balance training, Stair training, Taping, Joint mobilization, Joint manipulation, Spinal manipulation, Spinal  mobilization, Scar mobilization, Compression bandaging, Vestibular training, DME instructions, Cryotherapy, and Moist heat  PLAN FOR NEXT SESSION: check STGs,  assess response to dry needling,  review HEP PRN,  postural strengthening, shoulder strengthening, cervical mobility   Corean JULIANNA Ku, PT, DPT 02/06/24 9:17 AM

## 2024-02-07 NOTE — Progress Notes (Signed)
 [facility] History and Physical   Primary Care Physician:  Loreli Elsie [name], MD   Reason for Procedure:    Encounter Diagnosis  Name Primary?   Hx of adenomatous colonic polyps Yes     Plan:    Colonoscopy - cancelled - patient saw cardiology 01/30/2024 with exertional chest p[ain and dyspnea and a Zio monitor was applied. He has low-risk ischemia evaluation in past 1-2 years but given ongoing cardiology evaluation will cancel procedure until cleared by cardiology.     HPI: Andrew Payne is a 78 y.o. male w/ hx colon polyps here for surveillance exam.   04/2016 - 7 adenomas max 12 mm 07/31/2019 4 diminutive polyps 2 ssp and 2 adenomas all diminutive  Past Medical History:  Diagnosis Date   Bradycardia    cardiology-- dr inocencio--  work-up done included event monitor and echo (all in epic)   Carotid artery stenosis    per pt H&P dated 10-20-2018 --  <40%   Coronary atherosclerosis    Glaucoma, right eye    History of nuclear stress test 04/15/2005   (in epic)  for near syncope--- Low risk normal no ischemia, ef 57%   Hx of adenomatous colonic polyps 05/27/2016   Internal hemorrhoids    Morton neuroma, left    OA (osteoarthritis)    wrist, elbow, thumb, knees   PONV (postoperative nausea and vomiting)    PVC's (premature ventricular contractions)    RBBB (right bundle branch block)    States heart rate has been as low as 28 and asyptomatic    Past Surgical History:  Procedure Laterality Date   COLONOSCOPY  05/2016   TA   ELBOW SURGERY Left 04/2017   EXCISION MORTON'S NEUROMA Left 10/23/2018   Procedure: EXCISION MORTON'S NEUROMA LEFT FOOT;  Surgeon: Zan Factor, DPM;  Location: [facility];  Service: Podiatry;  Laterality: Left;   HEMORRHOID BANDING  2018   INGUINAL HERNIA REPAIR Left 06/14/2017   Procedure: LEFT INGUINAL HERNIA REPAIR ERAS PATHWAY;  Surgeon: Vanderbilt Ned, MD;  Location: [facility];  Service: General;   Laterality: Left;   INGUINAL HERNIA REPAIR Right 11-18-2009   dr tanda  @[facility]    INGUINAL HERNIA REPAIR Right 12/16/2022   Procedure: RIGHT HERNIA REPAIR INGUINAL WITH MESH;  Surgeon: Vanderbilt Ned, MD;  Location: [facility];  Service: General;  Laterality: Right;  TAP BLOCK/GEN   INSERTION OF MESH Left 06/14/2017   Procedure: INSERTION OF MESH;  Surgeon: Vanderbilt Ned, MD;  Location: [facility];  Service: General;  Laterality: Left;   KNEE ARTHROSCOPY W/ MENISCAL REPAIR Right 09/2015   POLYPECTOMY     TONSILLECTOMY AND ADENOIDECTOMY  child     Current Outpatient Medications  Medication Sig Dispense Refill   amLODipine (NORVASC) 5 MG tablet Take 0.5 tablets (2.5 mg total) by mouth daily. 45 tablet 3   aspirin  EC 81 MG tablet Take 1 tablet (81 mg total) by mouth daily. Swallow whole.     atorvastatin  (LIPITOR ) 40 MG tablet Take 1 tablet (40 mg total) by mouth daily. 90 tablet 3   Cholecalciferol (VITAMIN D3) 1000 units CAPS Take 1 capsule by mouth daily.     dorzolamide  (TRUSOPT ) 2 % ophthalmic solution Place 1 drop into the right eye daily.      ipratropium (ATROVENT) 0.03 % nasal spray as needed for rhinitis.     latanoprost  (XALATAN ) 0.005 % ophthalmic solution Place 1 drop into the right eye  at bedtime.      Melatonin 5 MG CAPS Take 1 capsule by mouth as needed. (Patient taking differently: Take 1 capsule by mouth daily.)     No current facility-administered medications for this visit.    Allergies as of 02/08/2024 - Review Complete 02/06/2024  Allergen Reaction Noted   Codeine  01/06/2022   Oxycodone  Nausea And Vomiting, Nausea Only, and Other (See Comments) 05/05/2016    Family History  Problem Relation Age of Onset   Heart disease Mother    Alzheimer's disease Father    Colon polyps Father    Colon cancer Neg Hx    Pancreatic cancer Neg Hx    Prostate cancer Neg Hx    Rectal cancer Neg Hx    Esophageal cancer Neg Hx     Social History    Socioeconomic History   Marital status: Married    Spouse name: Not on file   Number of children: Not on file   Years of education: Not on file   Highest education level: Not on file  Occupational History   Not on file  Tobacco Use   Smoking status: Never   Smokeless tobacco: Never  Vaping Use   Vaping status: Never Used  Substance and Sexual Activity   Alcohol  use: Not Currently   Drug use: No   Sexual activity: Not on file  Other Topics Concern   Not on file  Social History Narrative   He is married and retired has a second home in the [place] area \   Does drink alcohol  no smoking no tobacco no drug use   Social Drivers of Corporate investment banker Strain: Not on file  Food Insecurity: No Food Insecurity (01/08/2022)   Received from [facility]   Hunger Vital Sign    Within the past 12 months, you worried that your food would run out before you got the money to buy more.: Never true    Within the past 12 months, the food you bought just didn't last and you didn't have money to get more.: Never true  Transportation Needs: Not on file  Physical Activity: Not on file  Stress: Not on file  Social Connections: Not on file  Intimate Partner Violence: Not on file     @Keishawna Carranza  CHARLENA Commander, MD, [facility] [phone] (pager) 02/08/2024 10:42 AM@

## 2024-02-08 ENCOUNTER — Telehealth: Payer: Self-pay

## 2024-02-08 ENCOUNTER — Ambulatory Visit: Admitting: Internal Medicine

## 2024-02-08 ENCOUNTER — Telehealth (HOSPITAL_BASED_OUTPATIENT_CLINIC_OR_DEPARTMENT_OTHER): Payer: Self-pay | Admitting: *Deleted

## 2024-02-08 DIAGNOSIS — Z860101 Personal history of adenomatous and serrated colon polyps: Secondary | ICD-10-CM

## 2024-02-08 NOTE — Telephone Encounter (Signed)
 Received phone call from [facility] with [facility]. She states that the pt sent a my chart message regarding his cancelled colonoscopy today. I was able to give Niels the information verbally over the phone for the cardiac clearance request. She reports that the pt will likely need a f/u appt prior to the cardiologist giving clearance. Will route to Dr. Avram as RICK.

## 2024-02-08 NOTE — Telephone Encounter (Signed)
   Pre-operative Risk Assessment    Patient Name: Andrew Payne  DOB: Dec 11, 1945 MRN: [id number]   Date of last office visit: 01/30/24 [name], [facility] Date of next office visit: 03/30/24 DR. CAMNITZ   Request for Surgical Clearance    Procedure:  COLONOSCOPY  Date of Surgery:  Clearance TBD                                Surgeon:  DR. AVRAM Surgeon's Group or Practice Name:  [facility]  Phone number:  [phone] Fax number:  [phone]   Type of Clearance Requested:   - Medical    Type of Anesthesia:  PROPOFOL    Additional requests/questions:    Bonney Niels Jest   02/08/2024, 4:16 PM    Me to Burton Gahan     02/08/24  4:08 PM Good afternoon Mr. Pudlo,      We will need a preop clearance request to be faxed to your cardiologist fax# for the pre team fax# [phone]. I will be happy to reach out to Dr. Darilyn office as well on your behalf.    Thank you  Niels preop team  This MyChart message has not been read. Tonita Lonni RAMAN, RN to Me     02/08/24  3:56 PM Hey is this something that could go to pre op? At least the clearance part?    Tonita Lonni RAMAN, RN to Leng Montesdeoca     02/08/24  3:56 PM [name] will address on her next clinic day.     Thank you  [place] RN   This MyChart message has not been read. Alm VEAR Olberding Dave to [facility] Triage (supporting [name], [name])     02/08/24 12:11 PM I was scheduled to have a colonoscopy this morning, but because of the heart monitor, chest pressure when exercise walking, Dr. AVRAM and his anesthesiologist said they could not proceed with the procedure until and unless they had the go ahead from my cardiologist.  You ordered and I've been wearing the heart monitor since October 6.  Could you let me know if it's ok to reschedule the colonoscopy and also let Dr. Lupita AVRAM with [facility]  know if and when it's  ok to reschedule?   Also, you prescribed Amlodipine Besylate 5 mg (1/2 tablet) to see if it  helped my symptoms.  I have been taking it since October 9, but have not noticed any significant improvement so far.  How long should I continue to take the medication if I continue to see little to no improvement?  I may not have taken it long enough yet, but I don't want to continue if it's not helping. Thanks.

## 2024-02-15 ENCOUNTER — Encounter: Payer: Self-pay | Admitting: Physical Therapy

## 2024-02-15 ENCOUNTER — Ambulatory Visit: Admitting: Physical Therapy

## 2024-02-15 DIAGNOSIS — M542 Cervicalgia: Secondary | ICD-10-CM | POA: Diagnosis not present

## 2024-02-15 DIAGNOSIS — M6281 Muscle weakness (generalized): Secondary | ICD-10-CM

## 2024-02-15 DIAGNOSIS — G8929 Other chronic pain: Secondary | ICD-10-CM | POA: Diagnosis not present

## 2024-02-15 DIAGNOSIS — M25512 Pain in left shoulder: Secondary | ICD-10-CM

## 2024-02-15 DIAGNOSIS — R293 Abnormal posture: Secondary | ICD-10-CM | POA: Diagnosis not present

## 2024-02-15 NOTE — Therapy (Signed)
 OUTPATIENT PHYSICAL THERAPY TREATMENT    Patient Name: Andrew Payne MRN: [id number] DOB:May 11, 1945, 78 y.o., male Today's Date: 02/15/2024  END OF SESSION:  PT End of Session - 02/15/24 0845     Visit Number 4    Number of Visits 6    Date for Recertification  03/02/24    Authorization Type [employer] $10    Progress Note Due on Visit 10    PT Start Time 0845    PT Stop Time 0912    PT Time Calculation (min) 27 min    Activity Tolerance Patient tolerated treatment well    Behavior During Therapy [facility] for tasks assessed/performed             Past Medical History:  Diagnosis Date   Bradycardia    cardiology-- dr inocencio--  work-up done included event monitor and echo (all in epic)   Carotid artery stenosis    per pt H&P dated 10-20-2018 --  <40%   Coronary atherosclerosis    Glaucoma, right eye    History of nuclear stress test 04/15/2005   (in epic)  for near syncope--- Low risk normal no ischemia, ef 57%   Hx of adenomatous colonic polyps 05/27/2016   Internal hemorrhoids    Morton neuroma, left    OA (osteoarthritis)    wrist, elbow, thumb, knees   PONV (postoperative nausea and vomiting)    PVC's (premature ventricular contractions)    RBBB (right bundle branch block)    States heart rate has been as low as 28 and asyptomatic   Past Surgical History:  Procedure Laterality Date   COLONOSCOPY  05/2016   TA   ELBOW SURGERY Left 04/2017   EXCISION MORTON'S NEUROMA Left 10/23/2018   Procedure: EXCISION MORTON'S NEUROMA LEFT FOOT;  Surgeon: Zan Factor, DPM;  Location: [facility];  Service: Podiatry;  Laterality: Left;   HEMORRHOID BANDING  2018   INGUINAL HERNIA REPAIR Left 06/14/2017   Procedure: LEFT INGUINAL HERNIA REPAIR ERAS PATHWAY;  Surgeon: Vanderbilt Ned, MD;  Location: [facility];  Service: General;  Laterality: Left;   INGUINAL HERNIA REPAIR Right 11-18-2009   dr tanda  @[facility]    INGUINAL HERNIA REPAIR Right  12/16/2022   Procedure: RIGHT HERNIA REPAIR INGUINAL WITH MESH;  Surgeon: Vanderbilt Ned, MD;  Location: [facility];  Service: General;  Laterality: Right;  TAP BLOCK/GEN   INSERTION OF MESH Left 06/14/2017   Procedure: INSERTION OF MESH;  Surgeon: Vanderbilt Ned, MD;  Location: [facility];  Service: General;  Laterality: Left;   KNEE ARTHROSCOPY W/ MENISCAL REPAIR Right 09/2015   POLYPECTOMY     TONSILLECTOMY AND ADENOIDECTOMY  child   Patient Active Problem List   Diagnosis Date Noted   Impingement syndrome of left shoulder 02/11/2022   Multiple rib fractures 01/27/2022   Pneumothorax 01/27/2022   DOE (dyspnea on exertion) 12/30/2021   Nontraumatic incomplete tear of left rotator cuff 03/13/2020   Unilateral primary osteoarthritis, right knee 11/01/2017   Chest pain 08/07/2017   Bradycardia 08/07/2017   History of right tennis elbow 05/09/2017   Right tennis elbow 04/07/2017   Prolapsed internal hemorrhoids, grade 3 06/03/2016   Hx of adenomatous colonic polyps 05/27/2016    PCP: Elsie JONETTA Loreli Mickey, MD  REFERRING PROVIDER: Bertrum LELON Gaskins, [name]  REFERRING DIAG: [phone] (ICD-10-CM) - Trigger point of left shoulder region  THERAPY DIAG:  Chronic left shoulder pain  Abnormal posture  Muscle weakness (generalized)  Cervicalgia  Rationale for Evaluation and Treatment: Rehabilitation  ONSET DATE: several years of soreness  SUBJECTIVE:                                                                                                                                                                                      SUBJECTIVE STATEMENT: Still tender and still having the same issues when walking.  Doesn't feel like it has made much improvement with dry needling.    Hand dominance: Right  PERTINENT HISTORY: Patient endorses referral for Lt UT/shoulder trigger point. Patient has received a steroid injection in the past from Dr. Vernetta which  did not assist with pain. Patient has undergone Lt rotator cuff repair (2022-2023) and has also endured a fall off a deck where he landed on Lt side which fractured several ribs. No other surgeries or trauma noted to shoulder/neck or surrounding joints. Patient notes biggest trigger for achy pain is walking for prolonged periods of time and exercise.  See PMH for in depth comorbidities   PAIN:  Are you having pain? Yes: NPRS scale: 0/10 at rest, 5-7/10 at worst  Pain location: Lt UT  Pain description: severe ache  Aggravating factors: walking, exercise  Relieving factors: stretch (temporary)  PRECAUTIONS: None   RED FLAGS: None   WEIGHT BEARING RESTRICTIONS: No  FALLS:  Has patient fallen in last 6 months? No  LIVING ENVIRONMENT: Lives with: lives with their spouse Lives in: House/apartment Stairs: Yes: Internal: 16 steps; on right going up and External: 8 steps; on right going up Has following equipment at home: None  OCCUPATION: Retired; used to work for [employer]  PLOF: Independent  PATIENT GOALS: to relieve the pain  NEXT MD VISIT: not currently scheduled   OBJECTIVE:  Note: Objective measures were completed at Evaluation unless otherwise noted.  DIAGNOSTIC FINDINGS:  3 views of the left shoulder show well located shoulder with no acute  findings.   PATIENT SURVEYS :  PSFS: THE PATIENT SPECIFIC FUNCTIONAL SCALE  Place score of 0-10 (0 = unable to perform activity and 10 = able to perform activity at the same level as before injury or problem)  Activity Date: 01/20/2024    Walking for prolonged period of time  6    2. Exercise  4    Total Score 5      Total Score = Sum of activity scores/number of activities  Minimally Detectable Change: 3 points (for single activity); 2 points (for average score)  [facility] (nd). The Patient Specific Functional Scale . Retrieved from [email or web address]    COGNITION: Overall cognitive status: Within functional limits for tasks assessed  SENSATION: Light touch: WFL  POSTURE: Rounded shoulders, increased thoracic kyphosis, forward head   UPPER EXTREMITY ROM:    ROM Right Eval 01/20/2024 Left Eval 01/20/2024  Shoulder flexion [facility]  Shoulder extension    Shoulder abduction [facility]  WFL, but painful   Shoulder adduction    (Blank rows = not tested)  UPPER EXTREMITY MMT:  MMT Right Eval 01/20/2024 Left Eval 01/20/2024  Shoulder flexion 5/5 (seated) 4+/5 (seated)  Shoulder extension    Shoulder abduction 5/5 (seated) 4/5, painful (seated)  Shoulder adduction    Shoulder internal rotation 5/5 (seated) 5/5 (seated)  Shoulder external rotation 5/5 (seated) 5/5 (seated)  (Blank rows = not tested)  Cervical ROM: 01/20/2024  Flexion: 100% Extension: 75% Rt lateral flexion: 25% Lt lateral flexion: 25% Rt rotation: 85% LT rotation: 85%    SHOULDER SPECIAL TESTS: Impingement tests: Painful arc test: positive  Rotator cuff assessment: Empty can test: negative and Full can test: positive    PALPATION:  Bilat scapulae protracted and TTP with deep pressure to UT/Levator scap/supraspinatus earlier                                                                                                                              TREATMENT DATE:  02/15/24 STM with compression to Lt upper trap and supraspinatus, skilled palpation and monitoring of soft tissue during DN  Trigger Point Dry Needling  Subsequent Treatment: Instructions provided previously at initial dry needling treatment.   Patient Verbal Consent Given: Yes Education Handout Provided: Yes Muscles Treated: Lt supraspinatus Electrical Stimulation Performed: No Treatment Response/Outcome: twitch responses noted   Discussed current progress (or lack of) and trial of DN to supraspinatus.  Otherwise pt has extensive HEP and exercise program and feel at this time if  today's session isn't helpful then we will hold PT and follow back up with MD.  Did discuss possible differentials but ultimately recommended he follow up with MD.   02/06/24 STM with compression to Lt upper trap, skilled palpation and monitoring of soft tissue during DN; IASTM with percussive device to Lt posterior shoulder muscles  Trigger Point Dry Needling  Subsequent Treatment: Instructions provided previously at initial dry needling treatment.   Patient Verbal Consent Given: Yes Education Handout Provided: Yes Muscles Treated: Lt upper trap Electrical Stimulation Performed: No Treatment Response/Outcome: twitch responses noted   Encouraged daily gym program with stretches as well as continued walking to see how symptoms respond - educated on post needling exercise and expectations   02/02/24 Seated upper trap stretch 3x20 sec to Rt Seated cervical rotation stretch 5x10 sec bil Seated scapular retraction x 10 reps; 5 sec hold Discussed gym exercises and recommended adding shoulder flexion and abduction with light weights into rotation as well; pt verbalized understanding. Educated on safe weight progressions     STM with compression to Lt upper trap and infraspinatus, skilled palpation and monitoring of soft tissue during DN  Trigger Point Dry  Needling  Initial Treatment: Pt instructed on Dry Needling rational, procedures, and possible side effects. Pt instructed to expect mild to moderate muscle soreness later in the day and/or into the next day.  Pt instructed in methods to reduce muscle soreness. Pt instructed to continue prescribed HEP. Patient was educated on signs and symptoms of infection and other risk factors and advised to seek medical attention should they occur.  Patient verbalized understanding of these instructions and education.   Patient Verbal Consent Given: Yes Education Handout Provided: Yes Muscles Treated: Lt upper trap; Lt infraspinatus Electrical  Stimulation Performed: No Treatment Response/Outcome: twitch responses noted    01/20/2024 TherEx:  HEP handout provided with patient performing one set of each activity for appropriate form. Verbal and tactile cues required.  PT discussed dry needling with patient and importance of performing HEP/physical activity with dry needling in order to receive max results. PT discussed interaction between trauma/injury/surgery, guarding of the shoulder, overuse of contralateral shoulder, and overuse of musculature due to guarding.   Self-Care:  POC and what to expect with recovery  PATIENT EDUCATION: Education details: HEP, POC, dry needling, musculoskeletal interactions Person educated: Patient Education method: Explanation, Demonstration, Tactile cues, Verbal cues, and Handouts Education comprehension: verbalized understanding, returned demonstration, verbal cues required, and tactile cues required  HOME EXERCISE PROGRAM: Access Code: [email or web address] URL: [email or web address] Date: 01/20/2024 Prepared by: Susannah Daring  Exercises - Seated Upper Trapezius Stretch  - 1 x daily - 7 x weekly - 2 sets - 30s hold - Seated Levator Scapulae Stretch  - 1 x daily - 7 x weekly - 2 sets - 30s hold - Cervical Extension AROM with Strap  - 1 x daily - 7 x weekly - 2 sets - 10 reps - 5s hold - Seated Assisted Cervical Rotation with Towel  - 1 x daily - 7 x weekly - 2 sets - 10 reps - 5s hold - Seated Cervical Retraction  - 1 x daily - 7 x weekly - 2 sets - 10 reps - 2s hold  ASSESSMENT:  CLINICAL IMPRESSION: Pt tolerated session well today with trial of supraspinatus DN today.  Overall limited change in symptoms at this time so if not improved after today would recommend return to MD.    OBJECTIVE IMPAIRMENTS: decreased activity tolerance, decreased coordination, decreased endurance, decreased mobility, difficulty walking, decreased ROM, decreased strength, increased muscle spasms, impaired UE  functional use, improper body mechanics, postural dysfunction, and pain.   ACTIVITY LIMITATIONS: carrying, lifting, and reach over head  PARTICIPATION LIMITATIONS: community activity and exercise  PERSONAL FACTORS: Past/current experiences, Time since onset of injury/illness/exacerbation, and 3+ comorbidities: arthritis, glaucoma, morton neuroma, Rt bundle branch block, carotic artery stenosis, coronary atherosclerosis, premature ventricular contractions are also affecting patient's functional outcome.   REHAB POTENTIAL: Good  CLINICAL DECISION MAKING: Stable/uncomplicated  EVALUATION COMPLEXITY: Low  GOALS: Goals reviewed with patient? Yes  SHORT TERM GOALS: Target date: 02/10/2024  Patient will show compliance with initial HEP. Baseline: Goal status: MET 02/02/24  2.  Patient will report pain levels no greater than 3/10 in order to show improvement in overall quality of life. Baseline:  Goal status: NOT MET 02/15/24   LONG TERM GOALS: Target date: 03/02/2024  Patient will be independent with final HEP in order to maintain and progress upon functional gains made within PT.  Baseline:  Goal status: INITIAL  2.  Patient will report pain levels no greater than 1/10 in order to show improvement in overall quality of life. Baseline:  Goal status: INITIAL  3.  Patient will increase PSFS to at least 7 in order to show a significant improvement in subjective disability rating. Baseline:  Goal status: INITIAL  4.  Patient will increase cervical lateral flexion ROM to at least 50% in order to improve functional mobility. Baseline:  Goal status: INITIAL  5.  Patient will increase cervical extension ROM to at least 100% in order to improve functional mobility. Baseline:  Goal status: INITIAL  6.  Patient will increase Lt shoulder abduction MMT to at least 4+/5 in order to improve biomechanics with functional mobility. Baseline:  Goal status: INITIAL  PLAN: PT FREQUENCY:  1x/week  PT DURATION: 6 weeks  PLANNED INTERVENTIONS: [id number]- PT Re-evaluation, 97750- Physical Performance Testing, 97110-Therapeutic exercises, 97530- Therapeutic activity, [id number]- Neuromuscular re-education, 97535- Self Care, [id number]- Manual therapy, [id number]- Gait training, [phone]- Orthotic Initial, [phone]- Orthotic/Prosthetic subsequent, [phone]- Canalith repositioning, [phone]- Aquatic Therapy, [phone]- Electrical stimulation (unattended), [phone]- Electrical stimulation (manual), [id number]- Vasopneumatic device, [id number]- Ultrasound, [id number]- Traction (mechanical), [id number]- Ionotophoresis 4mg /ml Dexamethasone , 79439 (1-2 muscles), [id number] (3+ muscles)- Dry Needling, Patient/Family education, Balance training, Stair training, Taping, Joint mobilization, Joint manipulation, Spinal manipulation, Spinal mobilization, Scar mobilization, Compression bandaging, Vestibular training, DME instructions, Cryotherapy, and Moist heat  PLAN FOR NEXT SESSION:  assess response to dry needling and repeat PRN,  review HEP PRN,  postural strengthening, shoulder strengthening, cervical mobility   Corean JULIANNA Ku, PT, DPT 02/15/24 9:26 AM

## 2024-02-20 ENCOUNTER — Ambulatory Visit: Admitting: Physical Therapy

## 2024-02-20 ENCOUNTER — Encounter: Payer: Self-pay | Admitting: Physical Therapy

## 2024-02-20 DIAGNOSIS — M6281 Muscle weakness (generalized): Secondary | ICD-10-CM | POA: Diagnosis not present

## 2024-02-20 DIAGNOSIS — R293 Abnormal posture: Secondary | ICD-10-CM | POA: Diagnosis not present

## 2024-02-20 DIAGNOSIS — G8929 Other chronic pain: Secondary | ICD-10-CM

## 2024-02-20 DIAGNOSIS — I493 Ventricular premature depolarization: Secondary | ICD-10-CM | POA: Diagnosis not present

## 2024-02-20 DIAGNOSIS — M25512 Pain in left shoulder: Secondary | ICD-10-CM | POA: Diagnosis not present

## 2024-02-20 DIAGNOSIS — M542 Cervicalgia: Secondary | ICD-10-CM

## 2024-02-20 NOTE — Therapy (Signed)
 OUTPATIENT PHYSICAL THERAPY TREATMENT    Patient Name: Andrew Payne MRN: [id number] DOB:01-Oct-1945, 78 y.o., male Today's Date: 02/20/2024  END OF SESSION:  PT End of Session - 02/20/24 0903     Visit Number 5    Number of Visits 6    Date for Recertification  03/02/24    Authorization Type [employer] $10    Progress Note Due on Visit 10    PT Start Time 0845    PT Stop Time 0902    PT Time Calculation (min) 17 min    Activity Tolerance Patient tolerated treatment well    Behavior During Therapy [facility] for tasks assessed/performed              Past Medical History:  Diagnosis Date   Bradycardia    cardiology-- dr inocencio--  work-up done included event monitor and echo (all in epic)   Carotid artery stenosis    per pt H&P dated 10-20-2018 --  <40%   Coronary atherosclerosis    Glaucoma, right eye    History of nuclear stress test 04/15/2005   (in epic)  for near syncope--- Low risk normal no ischemia, ef 57%   Hx of adenomatous colonic polyps 05/27/2016   Internal hemorrhoids    Morton neuroma, left    OA (osteoarthritis)    wrist, elbow, thumb, knees   PONV (postoperative nausea and vomiting)    PVC's (premature ventricular contractions)    RBBB (right bundle branch block)    States heart rate has been as low as 28 and asyptomatic   Past Surgical History:  Procedure Laterality Date   COLONOSCOPY  05/2016   TA   ELBOW SURGERY Left 04/2017   EXCISION MORTON'S NEUROMA Left 10/23/2018   Procedure: EXCISION MORTON'S NEUROMA LEFT FOOT;  Surgeon: Zan Factor, DPM;  Location:  SURGERY CENTER;  Service: Podiatry;  Laterality: Left;   HEMORRHOID BANDING  2018   INGUINAL HERNIA REPAIR Left 06/14/2017   Procedure: LEFT INGUINAL HERNIA REPAIR ERAS PATHWAY;  Surgeon: Vanderbilt Ned, MD;  Location: [facility];  Service: General;  Laterality: Left;   INGUINAL HERNIA REPAIR Right 11-18-2009   dr tanda  @[facility]    INGUINAL HERNIA REPAIR Right  12/16/2022   Procedure: RIGHT HERNIA REPAIR INGUINAL WITH MESH;  Surgeon: Vanderbilt Ned, MD;  Location: [facility];  Service: General;  Laterality: Right;  TAP BLOCK/GEN   INSERTION OF MESH Left 06/14/2017   Procedure: INSERTION OF MESH;  Surgeon: Vanderbilt Ned, MD;  Location: [facility];  Service: General;  Laterality: Left;   KNEE ARTHROSCOPY W/ MENISCAL REPAIR Right 09/2015   POLYPECTOMY     TONSILLECTOMY AND ADENOIDECTOMY  child   Patient Active Problem List   Diagnosis Date Noted   Impingement syndrome of left shoulder 02/11/2022   Multiple rib fractures 01/27/2022   Pneumothorax 01/27/2022   DOE (dyspnea on exertion) 12/30/2021   Nontraumatic incomplete tear of left rotator cuff 03/13/2020   Unilateral primary osteoarthritis, right knee 11/01/2017   Chest pain 08/07/2017   Bradycardia 08/07/2017   History of right tennis elbow 05/09/2017   Right tennis elbow 04/07/2017   Prolapsed internal hemorrhoids, grade 3 06/03/2016   Hx of adenomatous colonic polyps 05/27/2016    PCP: Elsie JONETTA Loreli Mickey, MD  REFERRING PROVIDER: Bertrum LELON Gaskins, [name]  REFERRING DIAG: [phone] (ICD-10-CM) - Trigger point of left shoulder region  THERAPY DIAG:  Chronic left shoulder pain  Abnormal posture  Muscle weakness (generalized)  Cervicalgia  Rationale for Evaluation and Treatment: Rehabilitation  ONSET DATE: several years of soreness  SUBJECTIVE:                                                                                                                                                                                      SUBJECTIVE STATEMENT: Made it further into the walk before symptoms started, and not as intense as it has been.   Hand dominance: Right  PERTINENT HISTORY: Patient endorses referral for Lt UT/shoulder trigger point. Patient has received a steroid injection in the past from Dr. Vernetta which did not assist with pain. Patient has  undergone Lt rotator cuff repair (2022-2023) and has also endured a fall off a deck where he landed on Lt side which fractured several ribs. No other surgeries or trauma noted to shoulder/neck or surrounding joints. Patient notes biggest trigger for achy pain is walking for prolonged periods of time and exercise.  See PMH for in depth comorbidities   PAIN:  Are you having pain? Yes: NPRS scale: 0/10 at rest, 5-7/10 at worst  Pain location: Lt UT  Pain description: severe ache  Aggravating factors: walking, exercise  Relieving factors: stretch (temporary)  PRECAUTIONS: None   RED FLAGS: None   WEIGHT BEARING RESTRICTIONS: No  FALLS:  Has patient fallen in last 6 months? No  LIVING ENVIRONMENT: Lives with: lives with their spouse Lives in: House/apartment Stairs: Yes: Internal: 16 steps; on right going up and External: 8 steps; on right going up Has following equipment at home: None  OCCUPATION: Retired; used to work for [employer]  PLOF: Independent  PATIENT GOALS: to relieve the pain  NEXT MD VISIT: not currently scheduled   OBJECTIVE:  Note: Objective measures were completed at Evaluation unless otherwise noted.  DIAGNOSTIC FINDINGS:  3 views of the left shoulder show well located shoulder with no acute  findings.   PATIENT SURVEYS :  PSFS: THE PATIENT SPECIFIC FUNCTIONAL SCALE  Place score of 0-10 (0 = unable to perform activity and 10 = able to perform activity at the same level as before injury or problem)  Activity Date: 01/20/2024    Walking for prolonged period of time  6    2. Exercise  4    Total Score 5      Total Score = Sum of activity scores/number of activities  Minimally Detectable Change: 3 points (for single activity); 2 points (for average score)  [facility] (nd). The Patient Specific Functional Scale . Retrieved from [email or web address]   COGNITION: Overall cognitive  status: Within functional limits for tasks assessed     SENSATION: Light touch: [facility]  POSTURE: Rounded shoulders, increased thoracic kyphosis, forward head   UPPER EXTREMITY ROM:    ROM Right Eval 01/20/2024 Left Eval 01/20/2024  Shoulder flexion [facility]  Shoulder extension    Shoulder abduction [facility]  WFL, but painful   Shoulder adduction    (Blank rows = not tested)  UPPER EXTREMITY MMT:  MMT Right Eval 01/20/2024 Left Eval 01/20/2024  Shoulder flexion 5/5 (seated) 4+/5 (seated)  Shoulder extension    Shoulder abduction 5/5 (seated) 4/5, painful (seated)  Shoulder adduction    Shoulder internal rotation 5/5 (seated) 5/5 (seated)  Shoulder external rotation 5/5 (seated) 5/5 (seated)  (Blank rows = not tested)  Cervical ROM: 01/20/2024  Flexion: 100% Extension: 75% Rt lateral flexion: 25% Lt lateral flexion: 25% Rt rotation: 85% LT rotation: 85%    SHOULDER SPECIAL TESTS: Impingement tests: Painful arc test: positive  Rotator cuff assessment: Empty can test: negative and Full can test: positive    PALPATION:  Bilat scapulae protracted and TTP with deep pressure to UT/Levator scap/supraspinatus earlier                                                                                                                              TREATMENT DATE:  02/20/24 STM with compression to Lt upper trap and supraspinatus, skilled palpation and monitoring of soft tissue during DN; discussed home percussive device as well with pt and various options to use  Trigger Point Dry Needling  Subsequent Treatment: Instructions provided previously at initial dry needling treatment.   Patient Verbal Consent Given: Yes Education Handout Provided: Yes Muscles Treated: Lt supraspinatus Electrical Stimulation Performed: No Treatment Response/Outcome: twitch responses noted     02/15/24 STM with compression to Lt upper trap and supraspinatus, skilled palpation and monitoring of soft  tissue during DN  Trigger Point Dry Needling  Subsequent Treatment: Instructions provided previously at initial dry needling treatment.   Patient Verbal Consent Given: Yes Education Handout Provided: Yes Muscles Treated: Lt supraspinatus Electrical Stimulation Performed: No Treatment Response/Outcome: twitch responses noted   Discussed current progress (or lack of) and trial of DN to supraspinatus.  Otherwise pt has extensive HEP and exercise program and feel at this time if today's session isn't helpful then we will hold PT and follow back up with MD.  Did discuss possible differentials but ultimately recommended he follow up with MD.   02/06/24 STM with compression to Lt upper trap, skilled palpation and monitoring of soft tissue during DN; IASTM with percussive device to Lt posterior shoulder muscles  Trigger Point Dry Needling  Subsequent Treatment: Instructions provided previously at initial dry needling treatment.   Patient Verbal Consent Given: Yes Education Handout Provided: Yes Muscles Treated: Lt upper trap Electrical Stimulation Performed: No Treatment Response/Outcome: twitch responses noted   Encouraged daily gym program with stretches as well as continued walking to see how symptoms respond - educated on post needling exercise and expectations   PATIENT EDUCATION: Education details: HEP,  POC, dry needling, musculoskeletal interactions Person educated: Patient Education method: Explanation, Demonstration, Tactile cues, Verbal cues, and Handouts Education comprehension: verbalized understanding, returned demonstration, verbal cues required, and tactile cues required  HOME EXERCISE PROGRAM: Access Code: [email or web address] URL: [email or web address] Date: 01/20/2024 Prepared by: Susannah Daring  Exercises - Seated Upper Trapezius Stretch  - 1 x daily - 7 x weekly - 2 sets - 30s hold - Seated Levator Scapulae Stretch  - 1 x daily - 7 x weekly - 2 sets - 30s  hold - Cervical Extension AROM with Strap  - 1 x daily - 7 x weekly - 2 sets - 10 reps - 5s hold - Seated Assisted Cervical Rotation with Towel  - 1 x daily - 7 x weekly - 2 sets - 10 reps - 5s hold - Seated Cervical Retraction  - 1 x daily - 7 x weekly - 2 sets - 10 reps - 2s hold  ASSESSMENT:  CLINICAL IMPRESSION: Pt reporting improvement in symptoms after last session so repeated today.  Continue PT at this time, will plan to check goals next visit.   OBJECTIVE IMPAIRMENTS: decreased activity tolerance, decreased coordination, decreased endurance, decreased mobility, difficulty walking, decreased ROM, decreased strength, increased muscle spasms, impaired UE functional use, improper body mechanics, postural dysfunction, and pain.   ACTIVITY LIMITATIONS: carrying, lifting, and reach over head  PARTICIPATION LIMITATIONS: community activity and exercise  PERSONAL FACTORS: Past/current experiences, Time since onset of injury/illness/exacerbation, and 3+ comorbidities: arthritis, glaucoma, morton neuroma, Rt bundle branch block, carotic artery stenosis, coronary atherosclerosis, premature ventricular contractions are also affecting patient's functional outcome.   REHAB POTENTIAL: Good  CLINICAL DECISION MAKING: Stable/uncomplicated  EVALUATION COMPLEXITY: Low  GOALS: Goals reviewed with patient? Yes  SHORT TERM GOALS: Target date: 02/10/2024  Patient will show compliance with initial HEP. Baseline: Goal status: MET 02/02/24  2.  Patient will report pain levels no greater than 3/10 in order to show improvement in overall quality of life. Baseline:  Goal status: NOT MET 02/15/24   LONG TERM GOALS: Target date: 03/02/2024  Patient will be independent with final HEP in order to maintain and progress upon functional gains made within PT.  Baseline:  Goal status: INITIAL  2.  Patient will report pain levels no greater than 1/10 in order to show improvement in overall quality of  life. Baseline:  Goal status: INITIAL  3.  Patient will increase PSFS to at least 7 in order to show a significant improvement in subjective disability rating. Baseline:  Goal status: INITIAL  4.  Patient will increase cervical lateral flexion ROM to at least 50% in order to improve functional mobility. Baseline:  Goal status: INITIAL  5.  Patient will increase cervical extension ROM to at least 100% in order to improve functional mobility. Baseline:  Goal status: INITIAL  6.  Patient will increase Lt shoulder abduction MMT to at least 4+/5 in order to improve biomechanics with functional mobility. Baseline:  Goal status: INITIAL  PLAN: PT FREQUENCY: 1x/week  PT DURATION: 6 weeks  PLANNED INTERVENTIONS: 97164- PT Re-evaluation, 97750- Physical Performance Testing, 97110-Therapeutic exercises, 97530- Therapeutic activity, [id number]- Neuromuscular re-education, 97535- Self Care, 02859- Manual therapy, [id number]- Gait training, [phone]- Orthotic Initial, [phone]- Orthotic/Prosthetic subsequent, [phone]- Canalith repositioning, [phone]- Aquatic Therapy, [phone]- Electrical stimulation (unattended), [phone]- Electrical stimulation (manual), [id number]- Vasopneumatic device, [id number]- Ultrasound, [id number]- Traction (mechanical), [id number]- Ionotophoresis 4mg /ml Dexamethasone , 79439 (1-2 muscles), 20561 (3+ muscles)- Dry Needling, Patient/Family education, Balance training, Stair training, Taping, Joint mobilization, Joint manipulation, Spinal manipulation, Spinal  mobilization, Scar mobilization, Compression bandaging, Vestibular training, DME instructions, Cryotherapy, and Moist heat  PLAN FOR NEXT SESSION:  will need goal assessment, recert or d/c based on symptoms,  assess response to dry needling and repeat PRN,  review HEP PRN,  postural strengthening, shoulder strengthening, cervical mobility   Corean JULIANNA Ku, PT, DPT 02/20/24 9:19 AM

## 2024-02-21 ENCOUNTER — Ambulatory Visit: Payer: Self-pay | Admitting: Physician Assistant

## 2024-02-21 DIAGNOSIS — I493 Ventricular premature depolarization: Secondary | ICD-10-CM

## 2024-02-22 NOTE — Telephone Encounter (Signed)
 Will forward this note to all parties involved to see notes from preop APP Lamarr L. DNP

## 2024-02-22 NOTE — Progress Notes (Signed)
 Sending question over to [place] to address this for pt. He currently walks 5 miles daily. He is experiencing some chest tightness and sob during that 1 st mile. Patient will be calling back to see what he needs to do about his exercise regimen. He is scheduled to see [facility] 03/16/24. This appt was changed from 03/30/24.  Pt also wants to know is the reason for the change due to something concerning of the heart monitor?

## 2024-02-22 NOTE — Telephone Encounter (Signed)
 Patient is returning call. Please advise?

## 2024-02-22 NOTE — Progress Notes (Signed)
 Called pt lvm for return call to discuss change in schedules appt and heart monitor results.

## 2024-02-22 NOTE — Telephone Encounter (Signed)
   Name: MAURICE FOTHERINGHAM  DOB: 28-Aug-1945  MRN: [id number]  Primary Cardiologist: Arun K Thukkani, MD  Chart reviewed as part of pre-operative protocol coverage. The patient has an upcoming visit scheduled with Dr. Inocencio on 03/16/2024 at which time clearance can be addressed in case there are any issues that would impact surgical recommendations.  Colonoscopy Is not scheduled until TBD as below. I added preop FYI to appointment note so that provider is aware to address at time of outpatient visit.  Per office protocol the cardiology provider should forward their finalized clearance decision and recommendations regarding antiplatelet therapy to the requesting party below.    I will route this message as FYI to requesting party and remove this message from the preop box as separate preop APP input not needed at this time.   Please call with any questions.  Lamarr Satterfield, NP  02/22/2024, 8:47 AM

## 2024-02-23 ENCOUNTER — Other Ambulatory Visit

## 2024-02-23 DIAGNOSIS — Z006 Encounter for examination for normal comparison and control in clinical research program: Secondary | ICD-10-CM

## 2024-02-23 NOTE — Progress Notes (Signed)
 Relayed suggestion to patient.

## 2024-02-27 ENCOUNTER — Encounter: Payer: Self-pay | Admitting: Radiology

## 2024-02-28 ENCOUNTER — Ambulatory Visit: Admitting: Physical Therapy

## 2024-02-28 ENCOUNTER — Encounter: Payer: Self-pay | Admitting: Physical Therapy

## 2024-02-28 DIAGNOSIS — R2689 Other abnormalities of gait and mobility: Secondary | ICD-10-CM

## 2024-02-28 DIAGNOSIS — M542 Cervicalgia: Secondary | ICD-10-CM | POA: Diagnosis not present

## 2024-02-28 DIAGNOSIS — R293 Abnormal posture: Secondary | ICD-10-CM

## 2024-02-28 DIAGNOSIS — M6281 Muscle weakness (generalized): Secondary | ICD-10-CM | POA: Diagnosis not present

## 2024-02-28 DIAGNOSIS — M25512 Pain in left shoulder: Secondary | ICD-10-CM

## 2024-02-28 DIAGNOSIS — G8929 Other chronic pain: Secondary | ICD-10-CM

## 2024-02-28 NOTE — Therapy (Signed)
 OUTPATIENT PHYSICAL THERAPY TREATMENT    Patient Name: Andrew Payne MRN: [id number] DOB:Oct 12, 1945, 78 y.o., male Today's Date: 02/28/2024  END OF SESSION:  PT End of Session - 02/28/24 0927     Visit Number 6    Number of Visits 6    Date for Recertification  03/02/24    Authorization Type [employer] $10    Progress Note Due on Visit 10    PT Start Time 0930    PT Stop Time 0955    PT Time Calculation (min) 25 min    Activity Tolerance Patient tolerated treatment well    Behavior During Therapy [facility] for tasks assessed/performed               Past Medical History:  Diagnosis Date   Bradycardia    cardiology-- dr inocencio--  work-up done included event monitor and echo (all in epic)   Carotid artery stenosis    per pt H&P dated 10-20-2018 --  <40%   Coronary atherosclerosis    Glaucoma, right eye    History of nuclear stress test 04/15/2005   (in epic)  for near syncope--- Low risk normal no ischemia, ef 57%   Hx of adenomatous colonic polyps 05/27/2016   Internal hemorrhoids    Morton neuroma, left    OA (osteoarthritis)    wrist, elbow, thumb, knees   PONV (postoperative nausea and vomiting)    PVC's (premature ventricular contractions)    RBBB (right bundle branch block)    States heart rate has been as low as 28 and asyptomatic   Past Surgical History:  Procedure Laterality Date   COLONOSCOPY  05/2016   TA   ELBOW SURGERY Left 04/2017   EXCISION MORTON'S NEUROMA Left 10/23/2018   Procedure: EXCISION MORTON'S NEUROMA LEFT FOOT;  Surgeon: Zan Factor, DPM;  Location: [facility];  Service: Podiatry;  Laterality: Left;   HEMORRHOID BANDING  2018   INGUINAL HERNIA REPAIR Left 06/14/2017   Procedure: LEFT INGUINAL HERNIA REPAIR ERAS PATHWAY;  Surgeon: Vanderbilt Ned, MD;  Location: [facility];  Service: General;  Laterality: Left;   INGUINAL HERNIA REPAIR Right 11-18-2009   dr tanda  @[facility]    INGUINAL HERNIA REPAIR Right  12/16/2022   Procedure: RIGHT HERNIA REPAIR INGUINAL WITH MESH;  Surgeon: Vanderbilt Ned, MD;  Location: [facility];  Service: General;  Laterality: Right;  TAP BLOCK/GEN   INSERTION OF MESH Left 06/14/2017   Procedure: INSERTION OF MESH;  Surgeon: Vanderbilt Ned, MD;  Location: [facility];  Service: General;  Laterality: Left;   KNEE ARTHROSCOPY W/ MENISCAL REPAIR Right 09/2015   POLYPECTOMY     TONSILLECTOMY AND ADENOIDECTOMY  child   Patient Active Problem List   Diagnosis Date Noted   Impingement syndrome of left shoulder 02/11/2022   Multiple rib fractures 01/27/2022   Pneumothorax 01/27/2022   DOE (dyspnea on exertion) 12/30/2021   Nontraumatic incomplete tear of left rotator cuff 03/13/2020   Unilateral primary osteoarthritis, right knee 11/01/2017   Chest pain 08/07/2017   Bradycardia 08/07/2017   History of right tennis elbow 05/09/2017   Right tennis elbow 04/07/2017   Prolapsed internal hemorrhoids, grade 3 06/03/2016   Hx of adenomatous colonic polyps 05/27/2016    PCP: Elsie JONETTA Loreli Mickey, MD  REFERRING PROVIDER: Bertrum LELON Gaskins, [name]  REFERRING DIAG: [phone] (ICD-10-CM) - Trigger point of left shoulder region  THERAPY DIAG:  Chronic left shoulder pain  Abnormal posture  Muscle weakness (  generalized)  Cervicalgia  Other abnormalities of gait and mobility  Rationale for Evaluation and Treatment: Rehabilitation  ONSET DATE: several years of soreness  SUBJECTIVE:                                                                                                                                                                                      SUBJECTIVE STATEMENT: Feels about the same as last time; willing to give it one more shot today.  Hand dominance: Right  PERTINENT HISTORY: Patient endorses referral for Lt UT/shoulder trigger point. Patient has received a steroid injection in the past from Dr. Vernetta which did not  assist with pain. Patient has undergone Lt rotator cuff repair (2022-2023) and has also endured a fall off a deck where he landed on Lt side which fractured several ribs. No other surgeries or trauma noted to shoulder/neck or surrounding joints. Patient notes biggest trigger for achy pain is walking for prolonged periods of time and exercise.  See PMH for in depth comorbidities   PAIN:  Are you having pain? Yes: NPRS scale: 0/10 at rest, 5-7/10 at worst  Pain location: Lt UT  Pain description: severe ache  Aggravating factors: walking, exercise  Relieving factors: stretch (temporary)  PRECAUTIONS: None   RED FLAGS: None   WEIGHT BEARING RESTRICTIONS: No  FALLS:  Has patient fallen in last 6 months? No  LIVING ENVIRONMENT: Lives with: lives with their spouse Lives in: House/apartment Stairs: Yes: Internal: 16 steps; on right going up and External: 8 steps; on right going up Has following equipment at home: None  OCCUPATION: Retired; used to work for [employer]  PLOF: Independent  PATIENT GOALS: to relieve the pain  NEXT MD VISIT: not currently scheduled   OBJECTIVE:  Note: Objective measures were completed at Evaluation unless otherwise noted.  DIAGNOSTIC FINDINGS:  3 views of the left shoulder show well located shoulder with no acute  findings.   PATIENT SURVEYS :  PSFS: THE PATIENT SPECIFIC FUNCTIONAL SCALE  Place score of 0-10 (0 = unable to perform activity and 10 = able to perform activity at the same level as before injury or problem)  Activity Date: 01/20/2024  02/28/24   Walking for prolonged period of time  6 9   2. Exercise  4 9   Total Score 5 9     Total Score = Sum of activity scores/number of activities  Minimally Detectable Change: 3 points (for single activity); 2 points (for average score)  [facility] (nd). The Patient Specific Functional Scale . Retrieved from  [email or web address]   COGNITION: Overall cognitive status: Within functional limits for tasks assessed  SENSATION: Light touch: WFL  POSTURE: Rounded shoulders, increased thoracic kyphosis, forward head   UPPER EXTREMITY ROM:    ROM Right Eval 01/20/2024 Left Eval 01/20/2024  Shoulder flexion [facility]  Shoulder extension    Shoulder abduction [facility]  WFL, but painful   Shoulder adduction    (Blank rows = not tested)  UPPER EXTREMITY MMT:  MMT Right Eval 01/20/2024 Left Eval 01/20/2024 Left 02/28/24  Shoulder flexion 5/5 (seated) 4+/5 (seated)   Shoulder extension     Shoulder abduction 5/5 (seated) 4/5, painful (seated) 5/5  (pain)  Shoulder adduction     Shoulder internal rotation 5/5 (seated) 5/5 (seated)   Shoulder external rotation 5/5 (seated) 5/5 (seated)   (Blank rows = not tested)  Cervical ROM: 01/20/2024  Flexion: 100% Extension: 75% Rt lateral flexion: 25% Lt lateral flexion: 25% Rt rotation: 85% LT rotation: 85%    Cervical ROM: 02/28/2024  Flexion: 100% Extension: 100% Rt lateral flexion: 25% Lt lateral flexion: 25% Rt rotation: 95% LT rotation: 95%     SHOULDER SPECIAL TESTS: Impingement tests: Painful arc test: positive  Rotator cuff assessment: Empty can test: negative and Full can test: positive    PALPATION:  Bilat scapulae protracted and TTP with deep pressure to UT/Levator scap/supraspinatus earlier                                                                                                                              TREATMENT DATE:  02/28/24 Objective assessment - see above for details Discussed current progress and POC - pt in agreement to hold; will reach out if he needs to return  STM with compression to Lt upper trap and supraspinatus, skilled palpation and monitoring of soft tissue during DN; discussed home percussive device as well with pt and various options to  use  Trigger Point Dry Needling  Subsequent Treatment: Instructions provided previously at initial dry needling treatment.   Patient Verbal Consent Given: Yes Education Handout Provided: Yes Muscles Treated: Lt supraspinatus Electrical Stimulation Performed: No Treatment Response/Outcome: twitch responses noted   02/20/24 STM with compression to Lt upper trap and supraspinatus, skilled palpation and monitoring of soft tissue during DN; discussed home percussive device as well with pt and various options to use  Trigger Point Dry Needling  Subsequent Treatment: Instructions provided previously at initial dry needling treatment.   Patient Verbal Consent Given: Yes Education Handout Provided: Yes Muscles Treated: Lt supraspinatus Electrical Stimulation Performed: No Treatment Response/Outcome: twitch responses noted     02/15/24 STM with compression to Lt upper trap and supraspinatus, skilled palpation and monitoring of soft tissue during DN  Trigger Point Dry Needling  Subsequent Treatment: Instructions provided previously at initial dry needling treatment.   Patient Verbal Consent Given: Yes Education Handout Provided: Yes Muscles Treated: Lt supraspinatus Electrical Stimulation Performed: No Treatment Response/Outcome: twitch responses noted   Discussed current progress (or lack of) and trial of DN to supraspinatus.  Otherwise pt  has extensive HEP and exercise program and feel at this time if today's session isn't helpful then we will hold PT and follow back up with MD.  Did discuss possible differentials but ultimately recommended he follow up with MD.   02/06/24 STM with compression to Lt upper trap, skilled palpation and monitoring of soft tissue during DN; IASTM with percussive device to Lt posterior shoulder muscles  Trigger Point Dry Needling  Subsequent Treatment: Instructions provided previously at initial dry needling treatment.   Patient Verbal Consent  Given: Yes Education Handout Provided: Yes Muscles Treated: Lt upper trap Electrical Stimulation Performed: No Treatment Response/Outcome: twitch responses noted   Encouraged daily gym program with stretches as well as continued walking to see how symptoms respond - educated on post needling exercise and expectations   PATIENT EDUCATION: Education details: HEP, POC, dry needling, musculoskeletal interactions Person educated: Patient Education method: Explanation, Demonstration, Tactile cues, Verbal cues, and Handouts Education comprehension: verbalized understanding, returned demonstration, verbal cues required, and tactile cues required  HOME EXERCISE PROGRAM: Access Code: [email or web address] URL: [email or web address] Date: 01/20/2024 Prepared by: Susannah Daring  Exercises - Seated Upper Trapezius Stretch  - 1 x daily - 7 x weekly - 2 sets - 30s hold - Seated Levator Scapulae Stretch  - 1 x daily - 7 x weekly - 2 sets - 30s hold - Cervical Extension AROM with Strap  - 1 x daily - 7 x weekly - 2 sets - 10 reps - 5s hold - Seated Assisted Cervical Rotation with Towel  - 1 x daily - 7 x weekly - 2 sets - 10 reps - 5s hold - Seated Cervical Retraction  - 1 x daily - 7 x weekly - 2 sets - 10 reps - 2s hold  ASSESSMENT:  CLINICAL IMPRESSION: Pt has met 3 LTGs at this time and has demonstrated improvement in strength and some decrease in pain.  Overall doing well and would like to hold PT at this time.  Will hold x 30 days, and d/c if he doesn't return, otherwise will plan to reassess.     OBJECTIVE IMPAIRMENTS: decreased activity tolerance, decreased coordination, decreased endurance, decreased mobility, difficulty walking, decreased ROM, decreased strength, increased muscle spasms, impaired UE functional use, improper body mechanics, postural dysfunction, and pain.   ACTIVITY LIMITATIONS: carrying, lifting, and reach over head  PARTICIPATION LIMITATIONS: community activity and  exercise  PERSONAL FACTORS: Past/current experiences, Time since onset of injury/illness/exacerbation, and 3+ comorbidities: arthritis, glaucoma, morton neuroma, Rt bundle branch block, carotic artery stenosis, coronary atherosclerosis, premature ventricular contractions are also affecting patient's functional outcome.   REHAB POTENTIAL: Good  CLINICAL DECISION MAKING: Stable/uncomplicated  EVALUATION COMPLEXITY: Low  GOALS: Goals reviewed with patient? Yes  SHORT TERM GOALS: Target date: 02/10/2024  Patient will show compliance with initial HEP. Baseline: Goal status: MET 02/02/24  2.  Patient will report pain levels no greater than 3/10 in order to show improvement in overall quality of life. Baseline:  Goal status: NOT MET 02/15/24 (MET 02/28/24)   LONG TERM GOALS: Target date: 03/02/2024  Patient will be independent with final HEP in order to maintain and progress upon functional gains made within PT.  Baseline:  Goal status: ONGOING 02/28/24  2.  Patient will report pain levels no greater than 1/10 in order to show improvement in overall quality of life. Baseline:  Goal status: Ongoing (up to 2/10) 02/28/24  3.  Patient will increase PSFS to at least 7 in order to show a significant improvement  in subjective disability rating. Baseline:  Goal status: MET 02/28/24  4.  Patient will increase cervical lateral flexion ROM to at least 50% in order to improve functional mobility. Baseline:  Goal status: ONGOING 02/28/24  5.  Patient will increase cervical extension ROM to at least 100% in order to improve functional mobility. Baseline:  Goal status: MET 02/28/24  6.  Patient will increase Lt shoulder abduction MMT to at least 4+/5 in order to improve biomechanics with functional mobility. Baseline:  Goal status: MET 02/28/24 (still with pain)  PLAN: PT FREQUENCY: 1x/week  PT DURATION: 6 weeks  PLANNED INTERVENTIONS: 97164- PT Re-evaluation, 97750- Physical Performance  Testing, 97110-Therapeutic exercises, 97530- Therapeutic activity, [id number]- Neuromuscular re-education, 97535- Self Care, 02859- Manual therapy, [id number]- Gait training, [phone]- Orthotic Initial, [phone]- Orthotic/Prosthetic subsequent, [phone]- Canalith repositioning, [phone]- Aquatic Therapy, [phone]- Electrical stimulation (unattended), [phone]- Electrical stimulation (manual), [id number]- Vasopneumatic device, [id number]- Ultrasound, [id number]- Traction (mechanical), [id number]- Ionotophoresis 4mg /ml Dexamethasone , 79439 (1-2 muscles), 20561 (3+ muscles)- Dry Needling, Patient/Family education, Balance training, Stair training, Taping, Joint mobilization, Joint manipulation, Spinal manipulation, Spinal mobilization, Scar mobilization, Compression bandaging, Vestibular training, DME instructions, Cryotherapy, and Moist heat  PLAN FOR NEXT SESSION: hold x 30 days, d/c or recert based on whether pt returns  Corean JULIANNA Ku, PT, DPT 02/28/24 10:28 AM

## 2024-03-03 LAB — GENECONNECT MOLECULAR SCREEN: Genetic Analysis Overall Interpretation: NEGATIVE

## 2024-03-16 ENCOUNTER — Encounter: Payer: Self-pay | Admitting: Cardiology

## 2024-03-16 ENCOUNTER — Ambulatory Visit: Attending: Cardiology | Admitting: Cardiology

## 2024-03-16 VITALS — BP 118/60 | HR 57 | Ht 74.0 in | Wt 190.0 lb

## 2024-03-16 DIAGNOSIS — R001 Bradycardia, unspecified: Secondary | ICD-10-CM | POA: Diagnosis not present

## 2024-03-16 DIAGNOSIS — H2513 Age-related nuclear cataract, bilateral: Secondary | ICD-10-CM | POA: Diagnosis not present

## 2024-03-16 DIAGNOSIS — H401131 Primary open-angle glaucoma, bilateral, mild stage: Secondary | ICD-10-CM | POA: Diagnosis not present

## 2024-03-16 DIAGNOSIS — I493 Ventricular premature depolarization: Secondary | ICD-10-CM | POA: Diagnosis not present

## 2024-03-16 DIAGNOSIS — I452 Bifascicular block: Secondary | ICD-10-CM

## 2024-03-16 DIAGNOSIS — I4510 Unspecified right bundle-branch block: Secondary | ICD-10-CM

## 2024-03-16 DIAGNOSIS — H524 Presbyopia: Secondary | ICD-10-CM | POA: Diagnosis not present

## 2024-03-16 DIAGNOSIS — I495 Sick sinus syndrome: Secondary | ICD-10-CM | POA: Diagnosis not present

## 2024-03-16 MED ORDER — MEXILETINE HCL 250 MG PO CAPS: 250.0000 mg | ORAL_CAPSULE | Freq: Two times a day (BID) | ORAL | 3 refills | Status: AC

## 2024-03-16 NOTE — Patient Instructions (Signed)
 Medication Instructions:  Your physician has recommended you make the following change in your medication:  START Mexiletine 250 mg twice daily  *If you need a refill on your cardiac medications before your next appointment, please call your pharmacy*  Lab Work: None ordered  Testing/Procedures: None ordered  Follow-Up: At [facility], you and your health needs are our priority.  As part of our continuing mission to provide you with exceptional heart care, our providers are all part of one team.  This team includes your primary Cardiologist (physician) and Advanced Practice Providers or APPs (Physician Assistants and Nurse Practitioners) who all work together to provide you with the care you need, when you need it.  Your next appointment:   3 month(s)  Provider:   Soyla Norton, MD    Thank you for choosing [facility]!!   [phone]   Other Instructions  Mexiletine Capsules What is this medication? MEXILETINE (mex [place] e teen) treats a fast or irregular heartbeat (arrhythmia). It works by slowing down overactive electric signals in the heart, which stabilizes your heart rhythm. It belongs to a group of medications called antiarrhythmics. This medicine may be used for other purposes; ask your health care provider or pharmacist if you have questions. COMMON BRAND NAME(S): Mexitil What should I tell my care team before I take this medication? They need to know if you have any of these conditions: Liver disease Other heart problems Previous heart attack An unusual or allergic reaction to mexiletine, other medications, foods, dyes, or preservatives Pregnant or trying to get pregnant Breast-feeding How should I use this medication? Take this medication by mouth with a glass of water. Follow the directions on the prescription label. It is recommended that you take this medication with food or an antacid. Take your doses at regular intervals. Do not take your  medication more often than directed. Do not stop taking except on the advice of your care team. Talk to your care team about the use of this medication in children. Special care may be needed. Overdosage: If you think you have taken too much of this medicine contact a poison control center or emergency room at once. NOTE: This medicine is only for you. Do not share this medicine with others. What if I miss a dose? If you miss a dose, take it as soon as you can. If it is almost time for your next dose, take only that dose. Do not take double or extra doses. What may interact with this medication? Do not take this medication with any of the following: Dofetilide This medication may also interact with the following: Caffeine Cimetidine Medications for depression, anxiety, or other mental health conditions Medications for irregular heartbeat Phenobarbital Phenytoin Rifampin Theophylline This list may not describe all possible interactions. Give your health care provider a list of all the medicines, herbs, non-prescription drugs, or dietary supplements you use. Also tell them if you smoke, drink alcohol , or use illegal drugs. Some items may interact with your medicine. What should I watch for while using this medication? Your condition will be monitored carefully when you first begin therapy. Often, this medication is first started in a hospital or other monitored health care setting. Once you are on maintenance therapy, visit your care team for regular checks on your progress. Because your condition and use of this medication carry some risk, it is a good idea to carry an identification card, necklace, or bracelet with details of your condition, medications, and care team.  This medication may affect your coordination, reaction time, or judgment. Do not drive or operate machinery until you know how this medication affects you. Sit up or stand slowly to reduce the risk of dizzy or fainting spells.  Drinking alcohol  with this medication can increase the risk of these side effects. This medication may cause serious skin reactions. They can happen weeks to months after starting the medication. Contact your care team right away if you notice fevers or flu-like symptoms with a rash. The rash may be red or purple and then turn into blisters or peeling of the skin. You may also notice a red rash with swelling of the face, lips, or lymph nodes in your neck or under your arms. What side effects may I notice from receiving this medication? Side effects that you should report to your care team as soon as possible: Allergic reactions--skin rash, itching, hives, swelling of the face, lips, tongue, or throat Heart rhythm changes--fast or irregular heartbeat, dizziness, feeling faint or lightheaded, chest pain, trouble breathing Infection--fever, chills, cough, or sore throat Liver injury--right upper belly pain, loss of appetite, nausea, light-colored stool, dark yellow or brown urine, yellowing skin or eyes, unusual weakness or fatigue Rash, fever, and swollen lymph nodes Seizures Unusual bruising or bleeding Side effects that usually do not require medical attention (report to your care team if they continue or are bothersome): Anxiety, nervousness Blurry vision Dizziness Headache Heartburn Nausea Tremors or shaking Vomiting This list may not describe all possible side effects. Call your doctor for medical advice about side effects. You may report side effects to FDA at 1-800-FDA-1088. Where should I keep my medication? Keep out of reach of children. Store at room temperature between 15 and 30 degrees C (59 and 86 degrees F). Throw away any unused medication after the expiration date. NOTE: This sheet is a summary. It may not cover all possible information. If you have questions about this medicine, talk to your doctor, pharmacist, or health care provider.  2024 Elsevier/Gold Standard (2022-10-07  00:00:00)

## 2024-03-16 NOTE — Progress Notes (Signed)
 Electrophysiology Office Note:   Date:  03/16/2024  ID:  Andrew Payne, DOB 1945/12/28, MRN [id number]  Primary Cardiologist: Lurena MARLA Red, MD Primary Heart Failure: None Electrophysiologist: Sohaib Vereen Gladis Norton, MD      History of Present Illness:   Andrew Payne is a 78 y.o. male with h/o sick sinus syndrome, carotid stenosis, PVCs seen today for routine electrophysiology followup.   He has intermittent episodes of chest pressure and lightheadedness.  He wore a recent cardiac monitor that showed an elevated PVC burden at 12%.  He does have a Fitbit, and his heart rate has been in the 30s.  Discussed the use of AI scribe software for clinical note transcription with the patient, who gave verbal consent to proceed.  History of Present Illness Andrew Payne is a 78 year old male with premature ventricular contractions (PVCs) who presents with exertional chest pressure and dyspnea.  He experiences chest pressure or tightness and dyspnea primarily during physical exertion, such as walking uphill or exercising. These symptoms are alleviated by slowing down or stopping. He notes an increase in the frequency and duration of these episodes compared to when he first began.  During a recent monitoring period, he pressed the button on the monitor when symptoms started and waited until they subsided, which often took an hour to an hour and a half. He also experiences lightheadedness to the point of needing to slow down or stop.  At rest, he feels fine and does not experience these symptoms. His heart rate can drop to the thirties at night and spike to 50-70 during the day, though not necessarily during walking. He uses a Fitbit to track his heart rate, which shows variability throughout the day.  He has a history of low heart rate, typically in the forties and fifties during the day, and he has been experiencing PVCs during symptomatic episodes.  He has been using flecainide  but this has  been stopped.  he denies chest pain, palpitations, dyspnea, PND, orthopnea, nausea, vomiting, dizziness, syncope, edema, weight gain, or early satiety.   Review of systems complete and found to be negative unless listed in HPI.   EP Information / Studies Reviewed:    EKG is ordered today. Personal review as below.  EKG Interpretation Date/Time:  Friday March 16 2024 15:02:34 EST Ventricular Rate:  57 PR Interval:  128 QRS Duration:  136 QT Interval:  510 QTC Calculation: 496 R Axis:   -84  Text Interpretation: Sinus bradycardia with frequent Premature ventricular complexes Left axis deviation Right bundle branch block Inferior infarct , age undetermined When compared with ECG of 14-Sep-2023 10:35, Premature ventricular complexes are now Present Confirmed by [employer], Giomar Gusler ([id number]) on 03/16/2024 3:09:43 PM     Risk Assessment/Calculations:           Physical Exam:   VS:  BP 118/60 (BP Location: Right Arm, Patient Position: Sitting, Cuff Size: Large)   Pulse (!) 57   Ht 6' 2 (1.88 m)   Wt 190 lb (86.2 kg)   SpO2 96%   BMI 24.39 kg/m    Wt Readings from Last 3 Encounters:  03/16/24 190 lb (86.2 kg)  01/30/24 182 lb 12.8 oz (82.9 kg)  01/25/24 185 lb (83.9 kg)     GEN: Well nourished, well developed in no acute distress NECK: No JVD; No carotid bruits CARDIAC: Regular rate and rhythm with occasional ectopy, no murmurs, rubs, gallops RESPIRATORY:  Clear to auscultation without rales, wheezing or rhonchi  ABDOMEN: Soft, non-tender, non-distended EXTREMITIES:  No edema; No deformity   ASSESSMENT AND PLAN:    1.  Sick sinus syndrome: Continues to have resting bradycardia.  It would increase heart rate on treadmill.  No plan for pacemaker.  2.  PVCs: 4.3% burden.  Flecainide  has been stopped.  He feels quite poorly with his PVCs.  His burden has increased to 12% on his most recent cardiac monitor.  Kalisi Bevill start mexiletine 250 mg twice daily.  We did did discuss the options  of ablation.  If mexiletine does not work, he may wish to proceed with this.  Follow up with EP Team in 3 months  Signed, Neale Marzette Gladis Norton, MD

## 2024-03-16 NOTE — Addendum Note (Signed)
 Addended by: GRETEL MAEOLA CROME on: 03/16/2024 03:39 PM   Modules accepted: Orders

## 2024-03-30 ENCOUNTER — Ambulatory Visit: Admitting: Cardiology

## 2024-04-02 ENCOUNTER — Encounter: Payer: Self-pay | Admitting: Cardiology

## 2024-05-02 ENCOUNTER — Telehealth: Payer: Self-pay | Admitting: Internal Medicine

## 2024-05-02 NOTE — Telephone Encounter (Signed)
 Inbound call from patient stating that he needed to schedule his colonoscopy. He states that he needed to have clearance from his Cardiologist per Dr.Gessner and patient states he is good to have the procedure. I do not see clearance in his chart. Patient requesting a call back to discuss. Please advise.

## 2024-05-02 NOTE — Telephone Encounter (Signed)
 Dr Avram see cards note from 02/2024.  Is he ok to proceed with colon at this time in the [facility]?  Does he need office visit?

## 2024-05-03 NOTE — Telephone Encounter (Signed)
 I contacted Dr. Mal to be sure and its ok to schedule colonoscopy - Thanks   Andrew Soyla Lunger, MD  Avram Lupita BRAVO, MD His coronary CT showed no obstructive disease and his EF is normal. He may have PVCs but that should no cause an issue with sedation. Thanks and let me know if I can help with anything else.

## 2024-05-03 NOTE — Telephone Encounter (Signed)
 Maddie see note from Dr Avram- ok to set up colon with Dr Avram in the [facility] with previsit

## 2024-05-04 ENCOUNTER — Encounter: Payer: Self-pay | Admitting: Internal Medicine

## 2024-05-04 NOTE — Telephone Encounter (Signed)
 Patient was scheduled for colon on 2/26 at 9:00 and PV on 2/12 at 9:00

## 2024-06-07 ENCOUNTER — Encounter

## 2024-06-20 ENCOUNTER — Ambulatory Visit: Admitting: Cardiology

## 2024-06-21 ENCOUNTER — Encounter: Admitting: Internal Medicine
